# Patient Record
Sex: Female | Born: 1970
Health system: Southern US, Community
[De-identification: ages and names within clinical notes are randomized; demographics above are authoritative.]

## PROBLEM LIST (undated history)

## (undated) DIAGNOSIS — F419 Anxiety disorder, unspecified: Secondary | ICD-10-CM

## (undated) DIAGNOSIS — M810 Age-related osteoporosis without current pathological fracture: Secondary | ICD-10-CM

## (undated) DIAGNOSIS — E669 Obesity, unspecified: Secondary | ICD-10-CM

## (undated) DIAGNOSIS — N979 Female infertility, unspecified: Secondary | ICD-10-CM

## (undated) DIAGNOSIS — O09529 Supervision of elderly multigravida, unspecified trimester: Secondary | ICD-10-CM

## (undated) DIAGNOSIS — M549 Dorsalgia, unspecified: Secondary | ICD-10-CM

## (undated) DIAGNOSIS — F988 Other specified behavioral and emotional disorders with onset usually occurring in childhood and adolescence: Secondary | ICD-10-CM

## (undated) DIAGNOSIS — M2550 Pain in unspecified joint: Secondary | ICD-10-CM

## (undated) DIAGNOSIS — E119 Type 2 diabetes mellitus without complications: Secondary | ICD-10-CM

## (undated) DIAGNOSIS — I10 Essential (primary) hypertension: Secondary | ICD-10-CM

## (undated) DIAGNOSIS — G4730 Sleep apnea, unspecified: Secondary | ICD-10-CM

## (undated) DIAGNOSIS — F32A Depression, unspecified: Secondary | ICD-10-CM

## (undated) DIAGNOSIS — O1205 Gestational edema, complicating the puerperium: Secondary | ICD-10-CM

## (undated) HISTORY — PX: LEEP: SHX91

## (undated) HISTORY — PX: KNEE CARTILAGE SURGERY: SHX688

## (undated) HISTORY — DX: Pain in unspecified joint: M25.50

## (undated) HISTORY — DX: Anxiety disorder, unspecified: F41.9

## (undated) HISTORY — DX: Age-related osteoporosis without current pathological fracture: M81.0

## (undated) HISTORY — DX: Supervision of elderly multigravida, unspecified trimester: O09.529

## (undated) HISTORY — DX: Obesity, unspecified: E66.9

## (undated) HISTORY — DX: Female infertility, unspecified: N97.9

## (undated) HISTORY — DX: Dorsalgia, unspecified: M54.9

## (undated) HISTORY — DX: Other specified behavioral and emotional disorders with onset usually occurring in childhood and adolescence: F98.8

## (undated) HISTORY — PX: WISDOM TOOTH EXTRACTION: SHX21

## (undated) HISTORY — DX: Depression, unspecified: F32.A

---

## 2001-06-05 ENCOUNTER — Emergency Department (HOSPITAL_COMMUNITY): Admission: EM | Admit: 2001-06-05 | Discharge: 2001-06-05 | Payer: Self-pay | Admitting: Emergency Medicine

## 2001-06-05 ENCOUNTER — Encounter: Payer: Self-pay | Admitting: Emergency Medicine

## 2001-12-14 ENCOUNTER — Emergency Department (HOSPITAL_COMMUNITY): Admission: EM | Admit: 2001-12-14 | Discharge: 2001-12-14 | Payer: Self-pay | Admitting: Emergency Medicine

## 2002-03-31 ENCOUNTER — Emergency Department (HOSPITAL_COMMUNITY): Admission: EM | Admit: 2002-03-31 | Discharge: 2002-03-31 | Payer: Self-pay | Admitting: Emergency Medicine

## 2004-01-25 ENCOUNTER — Other Ambulatory Visit: Admission: RE | Admit: 2004-01-25 | Discharge: 2004-01-25 | Payer: Self-pay | Admitting: Obstetrics and Gynecology

## 2005-11-21 ENCOUNTER — Ambulatory Visit (HOSPITAL_COMMUNITY): Payer: Self-pay | Admitting: Psychiatry

## 2005-12-03 ENCOUNTER — Ambulatory Visit (HOSPITAL_COMMUNITY): Payer: Self-pay | Admitting: Psychiatry

## 2006-01-05 ENCOUNTER — Ambulatory Visit (HOSPITAL_COMMUNITY): Payer: Self-pay | Admitting: Psychiatry

## 2006-02-25 ENCOUNTER — Ambulatory Visit (HOSPITAL_COMMUNITY): Payer: Self-pay | Admitting: Psychiatry

## 2006-08-03 ENCOUNTER — Ambulatory Visit (HOSPITAL_COMMUNITY): Payer: Self-pay | Admitting: Psychiatry

## 2006-10-05 ENCOUNTER — Ambulatory Visit (HOSPITAL_COMMUNITY): Payer: Self-pay | Admitting: Psychiatry

## 2006-11-12 ENCOUNTER — Other Ambulatory Visit: Admission: RE | Admit: 2006-11-12 | Discharge: 2006-11-12 | Payer: Self-pay | Admitting: Family Medicine

## 2006-11-12 ENCOUNTER — Ambulatory Visit: Payer: Self-pay | Admitting: Family Medicine

## 2006-11-18 ENCOUNTER — Ambulatory Visit: Payer: Self-pay | Admitting: Family Medicine

## 2006-11-24 ENCOUNTER — Ambulatory Visit: Payer: Self-pay | Admitting: Family Medicine

## 2007-01-04 ENCOUNTER — Ambulatory Visit (HOSPITAL_COMMUNITY): Payer: Self-pay | Admitting: Psychiatry

## 2007-02-15 ENCOUNTER — Other Ambulatory Visit: Admission: RE | Admit: 2007-02-15 | Discharge: 2007-02-15 | Payer: Self-pay | Admitting: Family Medicine

## 2007-02-15 ENCOUNTER — Ambulatory Visit: Payer: Self-pay | Admitting: Family Medicine

## 2007-05-07 ENCOUNTER — Ambulatory Visit: Payer: Self-pay | Admitting: Family Medicine

## 2007-05-18 ENCOUNTER — Ambulatory Visit (HOSPITAL_COMMUNITY): Payer: Self-pay | Admitting: Psychiatry

## 2007-10-05 ENCOUNTER — Ambulatory Visit: Payer: Self-pay | Admitting: Family Medicine

## 2008-05-25 ENCOUNTER — Ambulatory Visit: Payer: Self-pay | Admitting: Family Medicine

## 2008-06-09 ENCOUNTER — Encounter: Payer: Self-pay | Admitting: Family Medicine

## 2008-06-09 ENCOUNTER — Ambulatory Visit: Payer: Self-pay | Admitting: Family Medicine

## 2008-06-09 ENCOUNTER — Other Ambulatory Visit: Admission: RE | Admit: 2008-06-09 | Discharge: 2008-06-09 | Payer: Self-pay | Admitting: Family Medicine

## 2009-10-19 ENCOUNTER — Ambulatory Visit: Payer: Self-pay | Admitting: Family Medicine

## 2009-12-07 ENCOUNTER — Ambulatory Visit: Payer: Self-pay | Admitting: Family Medicine

## 2010-01-01 ENCOUNTER — Ambulatory Visit: Payer: Self-pay | Admitting: Family Medicine

## 2010-11-11 ENCOUNTER — Other Ambulatory Visit (HOSPITAL_COMMUNITY): Payer: Self-pay | Admitting: Obstetrics & Gynecology

## 2010-11-11 DIAGNOSIS — N979 Female infertility, unspecified: Secondary | ICD-10-CM

## 2010-11-18 ENCOUNTER — Ambulatory Visit (HOSPITAL_COMMUNITY)
Admission: RE | Admit: 2010-11-18 | Discharge: 2010-11-18 | Disposition: A | Discharge status: Home / Self Care | Payer: 59 | Source: Ambulatory Visit | Attending: Obstetrics & Gynecology | Admitting: Obstetrics & Gynecology

## 2010-11-18 DIAGNOSIS — N979 Female infertility, unspecified: Secondary | ICD-10-CM | POA: Insufficient documentation

## 2011-03-29 ENCOUNTER — Emergency Department (HOSPITAL_COMMUNITY)
Admission: EM | Admit: 2011-03-29 | Discharge: 2011-03-30 | Disposition: A | Discharge status: Home / Self Care | Payer: 59 | Attending: Emergency Medicine | Admitting: Emergency Medicine

## 2011-03-29 DIAGNOSIS — F1010 Alcohol abuse, uncomplicated: Secondary | ICD-10-CM | POA: Insufficient documentation

## 2011-03-29 DIAGNOSIS — R059 Cough, unspecified: Secondary | ICD-10-CM | POA: Insufficient documentation

## 2011-03-29 DIAGNOSIS — R0602 Shortness of breath: Secondary | ICD-10-CM | POA: Insufficient documentation

## 2011-03-29 DIAGNOSIS — J45901 Unspecified asthma with (acute) exacerbation: Secondary | ICD-10-CM | POA: Insufficient documentation

## 2011-03-29 DIAGNOSIS — F988 Other specified behavioral and emotional disorders with onset usually occurring in childhood and adolescence: Secondary | ICD-10-CM | POA: Insufficient documentation

## 2011-03-29 DIAGNOSIS — R05 Cough: Secondary | ICD-10-CM | POA: Insufficient documentation

## 2011-03-29 DIAGNOSIS — I498 Other specified cardiac arrhythmias: Secondary | ICD-10-CM | POA: Insufficient documentation

## 2011-04-11 ENCOUNTER — Telehealth: Payer: Self-pay | Admitting: Family Medicine

## 2011-04-11 MED ORDER — AMPHETAMINE-DEXTROAMPHETAMINE 15 MG PO TABS: 15.0000 mg | ORAL_TABLET | Freq: Two times a day (BID) | ORAL | Status: DC

## 2011-04-11 NOTE — Telephone Encounter (Signed)
Pt needs refill for Adderall 15 mg  Bid.  Call pt when ready

## 2011-04-11 NOTE — Telephone Encounter (Signed)
DONE

## 2011-04-11 NOTE — Telephone Encounter (Signed)
Adderall renewed for 3 months 

## 2011-07-07 ENCOUNTER — Telehealth: Payer: Self-pay | Admitting: Family Medicine

## 2011-07-07 NOTE — Telephone Encounter (Signed)
It has been over a year so she needs an appointment

## 2011-07-08 ENCOUNTER — Encounter: Payer: Self-pay | Admitting: Family Medicine

## 2011-07-08 ENCOUNTER — Ambulatory Visit (INDEPENDENT_AMBULATORY_CARE_PROVIDER_SITE_OTHER): Payer: 59 | Admitting: Family Medicine

## 2011-07-08 VITALS — BP 132/90 | HR 80 | Wt 229.0 lb

## 2011-07-08 DIAGNOSIS — F909 Attention-deficit hyperactivity disorder, unspecified type: Secondary | ICD-10-CM

## 2011-07-08 MED ORDER — AMPHETAMINE-DEXTROAMPHET ER 20 MG PO CP24: 20.0000 mg | ORAL_CAPSULE | ORAL | Status: DC

## 2011-07-08 NOTE — Progress Notes (Signed)
  Subjective:    Patient ID: Mia Parker, female    DOB: [birth year], 40 y.o.   MRN: [id number]  HPI She is here for recheck on her ADD. She continues to use Adderall and states he goes for roughly 4 hours worth of benefit. Sometimes she takes this up to 3 times a day when she works long hours. She now has 2 jobs. She has no difficulties with these medications specifically with weight change or with sleeping.   Review of Systems     Objective:   Physical Exam Alert and in no distress otherwise not examined       Assessment & Plan:  ADD I will place her on Adderall XR to see if she gets longer-term relief. Also discussed the fact that we might use a shorter acting one when she needs to focus for longer than usual based on her work schedule. She will call me next week to let me know how longer acting one works

## 2011-07-08 NOTE — Patient Instructions (Signed)
Take the Adderall might me and give me a call in about a week . Let meknow how long does work and does it work

## 2011-07-11 ENCOUNTER — Telehealth: Payer: Self-pay | Admitting: Family Medicine

## 2011-07-11 NOTE — Telephone Encounter (Signed)
DONE

## 2011-07-22 ENCOUNTER — Telehealth: Payer: Self-pay | Admitting: Family Medicine

## 2011-07-22 MED ORDER — AMPHETAMINE-DEXTROAMPHET ER 25 MG PO CP24: 25.0000 mg | ORAL_CAPSULE | ORAL | Status: DC

## 2011-07-22 NOTE — Telephone Encounter (Signed)
Adderall XR 25 given. The 20 mg did not work

## 2011-07-23 NOTE — Telephone Encounter (Signed)
DONE

## 2011-07-31 ENCOUNTER — Telehealth: Payer: Self-pay | Admitting: Family Medicine

## 2011-07-31 NOTE — Telephone Encounter (Signed)
Pt called Mia Parker and advised Adderall 25 mg ER  Qd is lasting 7 - 8 hours, needs higher dose or add on.  Please advise [phone]

## 2011-08-01 MED ORDER — AMPHETAMINE-DEXTROAMPHET ER 25 MG PO CP24: 25.0000 mg | ORAL_CAPSULE | ORAL | Status: DC

## 2011-08-01 MED ORDER — AMPHETAMINE-DEXTROAMPHET ER 25 MG PO CP24: 25.0000 mg | ORAL_CAPSULE | Freq: Two times a day (BID) | ORAL | Status: DC

## 2011-08-01 NOTE — Telephone Encounter (Signed)
The Adderall XR is lasting her 8 hours but she needs close to 16 hours since she is working 2 jobs. She has been doing this for the last year and apparently doing fairly well. I warned her about possibly interfering with sleep.

## 2011-11-11 ENCOUNTER — Telehealth: Payer: Self-pay | Admitting: Family Medicine

## 2011-11-11 MED ORDER — AMPHETAMINE-DEXTROAMPHETAMINE 15 MG PO TABS: 15.0000 mg | ORAL_TABLET | Freq: Every day | ORAL | Status: DC

## 2011-11-11 NOTE — Telephone Encounter (Signed)
Adderall renewed.

## 2011-11-12 ENCOUNTER — Telehealth: Payer: Self-pay | Admitting: Family Medicine

## 2011-11-12 NOTE — Telephone Encounter (Signed)
DONE

## 2011-11-17 ENCOUNTER — Telehealth: Payer: Self-pay

## 2011-11-17 ENCOUNTER — Other Ambulatory Visit: Payer: Self-pay | Admitting: Family Medicine

## 2011-11-17 MED ORDER — AMPHETAMINE-DEXTROAMPHET ER 25 MG PO CP24: 25.0000 mg | ORAL_CAPSULE | Freq: Two times a day (BID) | ORAL | Status: DC

## 2011-11-17 MED ORDER — AMPHETAMINE-DEXTROAMPHET ER 25 MG PO CP24: 25.0000 mg | ORAL_CAPSULE | ORAL | Status: DC

## 2011-11-17 NOTE — Progress Notes (Signed)
The wrong prescriptions were written prior to this. She has been on Adderall XR 25 twice a day for several months and doing well on this. She needs this due to her 16 hour workdays.

## 2011-11-17 NOTE — Telephone Encounter (Signed)
Pt aware.

## 2011-11-17 NOTE — Telephone Encounter (Signed)
NO that the computer let us down. I will rewrite the prescriptions

## 2011-11-17 NOTE — Telephone Encounter (Signed)
Pt called and said her RX is wrong she Is on 25 mg XL BID adderal please advise

## 2012-02-13 ENCOUNTER — Telehealth: Payer: Self-pay | Admitting: Family Medicine

## 2012-02-13 MED ORDER — AMPHETAMINE-DEXTROAMPHET ER 25 MG PO CP24: 25.0000 mg | ORAL_CAPSULE | Freq: Two times a day (BID) | ORAL | Status: DC

## 2012-02-13 NOTE — Telephone Encounter (Signed)
Adderall renewed.

## 2012-02-13 NOTE — Telephone Encounter (Signed)
Pt informed

## 2012-05-13 ENCOUNTER — Telehealth: Payer: Self-pay | Admitting: Family Medicine

## 2012-05-13 MED ORDER — AMPHETAMINE-DEXTROAMPHET ER 25 MG PO CP24: 25.0000 mg | ORAL_CAPSULE | Freq: Two times a day (BID) | ORAL | Status: DC

## 2012-05-13 NOTE — Telephone Encounter (Signed)
kds °

## 2012-05-13 NOTE — Telephone Encounter (Signed)
Adderall renewed.

## 2012-06-17 ENCOUNTER — Encounter: Payer: Self-pay | Admitting: Family Medicine

## 2012-06-17 ENCOUNTER — Ambulatory Visit (INDEPENDENT_AMBULATORY_CARE_PROVIDER_SITE_OTHER): Payer: 59 | Admitting: Family Medicine

## 2012-06-17 VITALS — BP 130/80 | HR 102 | Wt 220.0 lb

## 2012-06-17 DIAGNOSIS — L309 Dermatitis, unspecified: Secondary | ICD-10-CM

## 2012-06-17 DIAGNOSIS — F909 Attention-deficit hyperactivity disorder, unspecified type: Secondary | ICD-10-CM

## 2012-06-17 DIAGNOSIS — L259 Unspecified contact dermatitis, unspecified cause: Secondary | ICD-10-CM

## 2012-06-17 DIAGNOSIS — M542 Cervicalgia: Secondary | ICD-10-CM

## 2012-06-17 MED ORDER — HYDROCODONE-ACETAMINOPHEN 5-500 MG PO TABS: 1.0000 | ORAL_TABLET | Freq: Three times a day (TID) | ORAL | Status: DC | PRN

## 2012-06-17 NOTE — Progress Notes (Signed)
  Subjective:    Patient ID: Mia Parker, female    DOB: 08-Oct-1970, 41 y.o.   MRN: [id number]  HPI 2 weeks ago she was doing some moving of heavy materials. The next day she felt some discomfort in the left scapular area. The pain got worse and she was seen in an urgent care Center. She was given tramadol and diazepam neither which have helped. She went back to the emergency room this morning for further evaluation and neck pain. She also felt some lumps at the base of her skull. They referred her to orthopedics. Also continues on her ADD medication. She continues on 2 a day which continue to help her stay focused throughout the whole day. She has no withdrawal side effects.   Review of Systems     Objective:   Physical Exam Alert and in no distress. Pain on motion of the neck. Tender to palpation in the left lower neck area. There is also erythema and slight swelling but no vesicle formation. Evaluation of her arms shows no lesions, hyperesthesia or hypoesthesia. Normal strength. DTRs normal.       Assessment & Plan:  Dermatitis possible early shingles. Neck pain. Continue with watchful waiting and pain relief. She will call if the rash spreads or her symptoms worsen in any way. ADD.she will continue her present medication regimen.

## 2012-06-21 ENCOUNTER — Telehealth: Payer: Self-pay | Admitting: Internal Medicine

## 2012-06-21 MED ORDER — VALACYCLOVIR HCL 1 G PO TABS: 1000.0000 mg | ORAL_TABLET | Freq: Two times a day (BID) | ORAL | Status: DC

## 2012-06-21 NOTE — Telephone Encounter (Signed)
She states the rash has spread and it does cause some itching.

## 2012-06-23 ENCOUNTER — Encounter: Payer: Self-pay | Admitting: Family Medicine

## 2012-06-28 ENCOUNTER — Ambulatory Visit (INDEPENDENT_AMBULATORY_CARE_PROVIDER_SITE_OTHER): Payer: 59 | Admitting: Family Medicine

## 2012-06-28 ENCOUNTER — Other Ambulatory Visit: Payer: Self-pay

## 2012-06-28 VITALS — BP 120/80 | HR 114 | Wt 222.0 lb

## 2012-06-28 DIAGNOSIS — M542 Cervicalgia: Secondary | ICD-10-CM

## 2012-06-28 DIAGNOSIS — B0229 Other postherpetic nervous system involvement: Secondary | ICD-10-CM

## 2012-06-28 MED ORDER — HYDROCODONE-ACETAMINOPHEN 5-500 MG PO TABS: 1.0000 | ORAL_TABLET | Freq: Three times a day (TID) | ORAL | Status: DC | PRN

## 2012-06-28 NOTE — Telephone Encounter (Signed)
Called vicodin in per jcl 

## 2012-06-28 NOTE — Progress Notes (Signed)
  Subjective:    Patient ID: Mia Parker, female    DOB: 1971/03/09, 41 y.o.   MRN: [id number]  HPI She is here for evaluation of continued difficulty with left neck and shoulder pain. This has been bothering her since her diagnosis of shingles.   Review of Systems     Objective:   Physical Exam Exam of the left neck does show healing blistered lesions in the upper back area in the C4 nerve root distribution       Assessment & Plan:   1. Neck pain  HYDROcodone-acetaminophen (VICODIN) 5-500 MG per tablet  2. Postherpetic neuralgia  HYDROcodone-acetaminophen (VICODIN) 5-500 MG per tablet   Discussed PHN with her. Recommended continued use of Vicodin. Discussed the fact that we might need to go to a different medication if this lasts roughly 3 months.

## 2012-06-28 NOTE — Patient Instructions (Signed)
Use the pain medication as needed. Avoid driving and the use of alcohol.

## 2012-07-13 ENCOUNTER — Telehealth: Payer: Self-pay | Admitting: Family Medicine

## 2012-07-15 NOTE — Telephone Encounter (Signed)
LM

## 2012-07-29 ENCOUNTER — Ambulatory Visit (INDEPENDENT_AMBULATORY_CARE_PROVIDER_SITE_OTHER): Payer: 59 | Admitting: Family Medicine

## 2012-07-29 ENCOUNTER — Telehealth: Payer: Self-pay | Admitting: Family Medicine

## 2012-07-29 ENCOUNTER — Encounter: Payer: Self-pay | Admitting: Family Medicine

## 2012-07-29 VITALS — BP 130/80 | HR 82 | Temp 97.8°F | Wt 221.0 lb

## 2012-07-29 DIAGNOSIS — L738 Other specified follicular disorders: Secondary | ICD-10-CM

## 2012-07-29 DIAGNOSIS — B0229 Other postherpetic nervous system involvement: Secondary | ICD-10-CM

## 2012-07-29 DIAGNOSIS — L739 Follicular disorder, unspecified: Secondary | ICD-10-CM

## 2012-07-29 MED ORDER — DOXYCYCLINE HYCLATE 100 MG PO TABS: 100.0000 mg | ORAL_TABLET | Freq: Two times a day (BID) | ORAL | Status: DC

## 2012-07-29 MED ORDER — AMITRIPTYLINE HCL 10 MG PO TABS: 10.0000 mg | ORAL_TABLET | Freq: Every day | ORAL | Status: DC

## 2012-07-29 NOTE — Progress Notes (Signed)
  Subjective:    Patient ID: Mia Parker, female    DOB: Jul 09, 1971, 41 y.o.   MRN: [id number]  HPI She is here for consult concerning itching and slight discomfort in the perianal area. She is concerned about recurrence of shingles. She also continues to have postherpetic pain that she labels as 8/10. He does take Vicodin at night to help her sleep.   Review of Systems     Objective:   Physical Exam Alert and in no distress. Exam of the perianal area does show multiple erythematous follicular lesions. No vesicles are noted.       Assessment & Plan:   1. Folliculitis  doxycycline (VIBRA-TABS) 100 MG tablet  2. Post herpetic neuralgia  amitriptyline (ELAVIL) 10 MG tablet   explained to her about the postherpetic neuralgia. I will start her out with amitriptyline and increase the dosing every several days. Also discussed other medications that might be used.

## 2012-07-29 NOTE — Telephone Encounter (Signed)
Get back in for a recheck

## 2012-07-29 NOTE — Patient Instructions (Signed)
Take the amitriptyline nightly but after 3 or 4 days if you're up note no improvement in your pain double the dose and again take that for several more days and if no improvement, call me

## 2012-07-29 NOTE — Telephone Encounter (Signed)
PT HAS APT

## 2012-08-06 ENCOUNTER — Telehealth: Payer: Self-pay | Admitting: Family Medicine

## 2012-08-09 MED ORDER — AMPHETAMINE-DEXTROAMPHET ER 25 MG PO CP24: 25.0000 mg | ORAL_CAPSULE | Freq: Two times a day (BID) | ORAL | Status: DC

## 2012-08-09 NOTE — Telephone Encounter (Signed)
Adderall renewed.

## 2012-11-02 ENCOUNTER — Telehealth: Payer: Self-pay | Admitting: Family Medicine

## 2012-11-03 MED ORDER — AMPHETAMINE-DEXTROAMPHET ER 25 MG PO CP24: 25.0000 mg | ORAL_CAPSULE | Freq: Two times a day (BID) | ORAL | Status: DC

## 2012-11-03 NOTE — Telephone Encounter (Signed)
TSD  

## 2012-11-03 NOTE — Addendum Note (Signed)
Addended by: Ronnald Nian on: 11/03/2012 10:31 PM   Modules accepted: Orders

## 2012-11-24 LAB — OB RESULTS CONSOLE HEPATITIS B SURFACE ANTIGEN: Hepatitis B Surface Ag: NEGATIVE

## 2012-11-24 LAB — OB RESULTS CONSOLE ABO/RH: RH Type: POSITIVE

## 2012-11-24 LAB — OB RESULTS CONSOLE RPR: RPR: NONREACTIVE

## 2012-12-24 ENCOUNTER — Other Ambulatory Visit: Payer: Self-pay

## 2013-01-17 ENCOUNTER — Ambulatory Visit (INDEPENDENT_AMBULATORY_CARE_PROVIDER_SITE_OTHER): Payer: 59 | Admitting: Family Medicine

## 2013-01-17 ENCOUNTER — Encounter: Payer: Self-pay | Admitting: Family Medicine

## 2013-01-17 VITALS — BP 112/78 | HR 98 | Wt 265.0 lb

## 2013-01-17 DIAGNOSIS — J309 Allergic rhinitis, unspecified: Secondary | ICD-10-CM

## 2013-01-17 DIAGNOSIS — J019 Acute sinusitis, unspecified: Secondary | ICD-10-CM

## 2013-01-17 DIAGNOSIS — J302 Other seasonal allergic rhinitis: Secondary | ICD-10-CM

## 2013-01-17 MED ORDER — FLUTICASONE PROPIONATE 50 MCG/ACT NA SUSP: 2.0000 | Freq: Every day | NASAL | Status: DC

## 2013-01-17 MED ORDER — AMOXICILLIN 875 MG PO TABS: 875.0000 mg | ORAL_TABLET | Freq: Two times a day (BID) | ORAL | Status: DC

## 2013-01-17 NOTE — Progress Notes (Signed)
  Subjective:    Patient ID: Mia Parker, female    DOB: [birth year], 42 y.o.   MRN: [id number]  HPI She has a 4 week history of nasal congestion, sinus pressure, headache, earache , rhinorrhea and itchy watery eyes,malaise, fatigue and myalgias. No PND, cough, sore throat or fever. He has a history of allergies and presently is on Zyrtec. She does not smoke and has not been exposed to anyone that has been sick. She is pregnant. She has stopped taking her Adderall   Review of Systems     Objective:   Physical Exam alert and in no distress. Tympanic membranes and canals are normal. Throat is clear. Tonsils are normal. Neck is supple without adenopathy or thyromegaly. Cardiac exam shows a regular sinus rhythm without murmurs or gallops. Lungs are clear to auscultation. Nasal mucosa is slightly pinkish with tenderness over maxillary sinuses       Assessment & Plan:  IUP (intrauterine pregnancy), incidental  Allergic rhinitis, seasonal - Plan: fluticasone (FLONASE) 50 MCG/ACT nasal spray  Acute sinusitis - Plan: amoxicillin (AMOXIL) 875 MG tablet

## 2013-01-17 NOTE — Patient Instructions (Signed)
Check with your OB concerning the Claritin and Zyrtec as well as the Flonase. Take all the antibiotic and if not totally back to normal give me a call

## 2013-03-02 ENCOUNTER — Other Ambulatory Visit (HOSPITAL_COMMUNITY): Payer: Self-pay | Admitting: Obstetrics and Gynecology

## 2013-03-02 DIAGNOSIS — Z6841 Body Mass Index (BMI) 40.0 and over, adult: Secondary | ICD-10-CM

## 2013-03-02 DIAGNOSIS — O09529 Supervision of elderly multigravida, unspecified trimester: Secondary | ICD-10-CM

## 2013-03-02 DIAGNOSIS — Z0489 Encounter for examination and observation for other specified reasons: Secondary | ICD-10-CM

## 2013-03-09 ENCOUNTER — Ambulatory Visit (HOSPITAL_COMMUNITY)
Admission: RE | Admit: 2013-03-09 | Discharge: 2013-03-09 | Disposition: A | Discharge status: Home / Self Care | Payer: 59 | Source: Ambulatory Visit | Attending: Obstetrics and Gynecology | Admitting: Obstetrics and Gynecology

## 2013-03-09 ENCOUNTER — Encounter (HOSPITAL_COMMUNITY): Payer: Self-pay

## 2013-03-09 DIAGNOSIS — O358XX0 Maternal care for other (suspected) fetal abnormality and damage, not applicable or unspecified: Secondary | ICD-10-CM | POA: Insufficient documentation

## 2013-03-09 DIAGNOSIS — O09529 Supervision of elderly multigravida, unspecified trimester: Secondary | ICD-10-CM | POA: Insufficient documentation

## 2013-03-09 DIAGNOSIS — IMO0001 Reserved for inherently not codable concepts without codable children: Secondary | ICD-10-CM | POA: Insufficient documentation

## 2013-03-09 DIAGNOSIS — O99210 Obesity complicating pregnancy, unspecified trimester: Secondary | ICD-10-CM | POA: Insufficient documentation

## 2013-03-09 DIAGNOSIS — Z6841 Body Mass Index (BMI) 40.0 and over, adult: Secondary | ICD-10-CM

## 2013-03-09 DIAGNOSIS — Z363 Encounter for antenatal screening for malformations: Secondary | ICD-10-CM | POA: Insufficient documentation

## 2013-03-09 DIAGNOSIS — Z1389 Encounter for screening for other disorder: Secondary | ICD-10-CM | POA: Insufficient documentation

## 2013-03-09 DIAGNOSIS — E669 Obesity, unspecified: Secondary | ICD-10-CM | POA: Insufficient documentation

## 2013-03-09 DIAGNOSIS — Z0489 Encounter for examination and observation for other specified reasons: Secondary | ICD-10-CM

## 2013-03-09 NOTE — Progress Notes (Signed)
Genetic Counseling  High-Risk Gestation Note  Appointment Date:  03/09/2013 Referred By: Mia Aden, MD Date of Birth:  Jan 25, 1971 Partner:  Mia Parker   Pregnancy History: [id number] Estimated Date of Delivery: 06/25/13 Estimated Gestational Age: [redacted]w[redacted]d Attending: Particia Nearing, MD   Mia Parker and her partner, [name], were seen for genetic counseling because of a maternal age of 42.  They were counseled regarding maternal age and the association with risk for chromosome conditions due to nondisjunction with aging of the ova.   We reviewed chromosomes, nondisjunction, and the associated 1 in 65 risk for fetal aneuploidy related to a maternal age of 42 y.o. at [redacted]w[redacted]d gestation. They were counseled that the risk for aneuploidy decreases as gestational age increases, accounting for those pregnancies which spontaneously abort.  We specifically discussed Down syndrome (trisomy 10), trisomies 74 and 28, and sex chromosome aneuploidies (47,XXX and 47,XXY) including the common features and prognoses of each.   We reviewed the results of Mia Parker's first trimester screen and her cell free fetal DNA (cffDNA) testing ([facility] prenatal test).  We discussed that both of these screening results were normal, and associated with a significant reduction in risks for specific fetal aneuploidy.  We reviewed the benefits and limitations of these tests.  Specifically, we discussed the conditions for which each test screens, the detection rates, and false positive rates of each.  They were also counseled regarding the availability of diagnostic testing via amniocentesis. We reviewed the approximate 1 in 300-500 risk for complications for amniocentesis, including spontaneous pregnancy loss. After consideration of all the options, they declined further testing for fetal aneuploidy.  We also discussed that Mia Parker's detailed ultrasound revealed a single umbilical artery (SUA).  They were  counseled that SUA occurs in approximately 1 in every 200 pregnancies and is defined as the presence of one umbilical artery and one umbilical vein, instead of the typical three vessel cord containing two arteries and one vein.  We discussed that the finding of an isolated SUA (no other markers or anomalies visualized) is not thought to increase the risk for fetal aneuploidy. However, the literature is conflicting regarding an association between this finding and congenital heart defects.  In addition, we discussed that SUA is known to be associated with an increased risk for fetal growth restriction.  We reviewed the options of serial ultrasound to monitor fetal growth and a fetal echocardiogram.      In addition, we discussed Mia Parker's age of 42.  They were counseled that advanced paternal age (APA) is defined as paternal age greater than or equal to age 87.  Recent large-scale sequencing studies have shown that approximately 80% of de novo point mutations are of paternal origin.  Many studies have demonstrated a strong correlation between increased paternal age and de novo point mutations.  Although no specific data is available regarding fetal risks for fathers 6+ years old at conception, it is apparent that the overall risk for single gene conditions is increased.  To estimate the relative increase in risk of a genetic disorder with APA, the heritability of the disease must be considered.  Assuming an approximate 2x increase in risk for conditions that are exclusively paternal in origin, the risk for each individual condition is still relatively low.  It is estimated that the overall chance for a de novo mutation is ~0.5%.  We also discussed the wide range of conditions which can be caused by new dominant gene mutations (achondroplasia, neurofibromatosis,  Marfan syndrome etc.).  They were counseled that genetic testing for each individual single gene condition is not warranted or available unless  ultrasound or family history concerns lend suspicion to a specific condition.    In addition, they were counseled that some literature suggests that APA is also associated with an increase in risk for fetal aneuploidy.  While other literature does not support this, we discussed that a specific risk for aneuploidy other than that based on maternal age cannot be quantified.  Lastly, we discussed that newer literature suggests that the risk for autism spectrum disorders (ASD) may be increased in children born to fathers of APA.  We discussed that ASDs are among the most common neurodevelopmental disorders, with approximately 1 in 85 children meeting criteria for ASD.  Approximately 80% of individuals diagnosed are female.  There is strong evidence that genetic factors play a critical role in development of ASD.  While there have been recent advances in identifying specific genetic causes of ASD, there are still many individuals for whom the etiology of the ASD is not known.  They understand that at this time there is no reliable, comprehensive genetic testing available for ASDs.     Both family histories were reviewed and found to be noncontributory for birth defects, mental retardation, and known genetic conditions. Mia Parker reported that when he was a child, an emergency room physician in [place] suggested that he has skeletal features of Marfan syndrome.  He was never referred to a pediatric medical geneticist; however, he reported that his pediatrician followed him and sent him for several echocardiograms during childhood/adolescence.  He reported that his echocardiograms have been within normal limits (medical records not available to verify the reported history).  We discussed that Marfan syndrome is a specific connective tissue disorder that is inherited in an autosomal dominant manner.  We reviewed autosomal dominant inheritance and the associated 50% risk of recurrence.  We discussed that molecular  analysis of FBN1 is available, but testing is not warranted unless the clinical suspicion of Marfan syndrome is high.  We discussed the option of a referral to medical genetics for an evaluation.  Mia Parker will contact us if he is interested.  He understands that the clinical, specifically the skeletal, features of Marfan syndrome overlap with other connective tissue disorders or can be unrelated to a specific syndrome (familial traits).  The risk to the fetus depends on whether or not Mia Parker has a specific diagnosis.  Prenatal genetic analysis is not warranted, given that Mia Parker does not have a specific diagnosis or known genetic alteration.  Without further information regarding the provided family history, an accurate genetic risk cannot be calculated. Further genetic counseling is warranted if more information is obtained.  Mia Parker denied exposure to environmental toxins or chemical agents. She denied the use of alcohol, tobacco or street drugs. She denied significant viral illnesses during the course of her pregnancy.   I counseled this couple regarding the above risks and available options.  The approximate face-to-face time with the genetic counselor was 45 minutes.  Donald Prose, MS Certified Genetic Counselor

## 2013-04-06 ENCOUNTER — Encounter: Payer: 59 | Attending: Obstetrics and Gynecology | Admitting: *Deleted

## 2013-04-06 ENCOUNTER — Encounter: Payer: Self-pay | Admitting: *Deleted

## 2013-04-06 VITALS — Ht 69.0 in | Wt 286.4 lb

## 2013-04-06 DIAGNOSIS — Z713 Dietary counseling and surveillance: Secondary | ICD-10-CM | POA: Insufficient documentation

## 2013-04-06 DIAGNOSIS — O99810 Abnormal glucose complicating pregnancy: Secondary | ICD-10-CM | POA: Insufficient documentation

## 2013-04-06 NOTE — Patient Instructions (Signed)
Goals:  Check glucose levels per MD as instructed  Follow Gestational Diabetes Diet as instructed  Call for follow-up as needed    

## 2013-04-06 NOTE — Progress Notes (Signed)
  Patient was seen on 04-06-13 for Gestational Diabetes self-management class at the [facility]. The following learning objectives were met by the patient during this course:   States the definition of Gestational Diabetes  States why dietary management is important in controlling blood glucose  Describes the effects each nutrient has on blood glucose levels  Demonstrates ability to create a balanced meal plan  Demonstrates carbohydrate counting   States when to check blood glucose levels  Demonstrates proper blood glucose monitoring techniques  States the effect of stress and exercise on blood glucose levels  States the importance of limiting caffeine and abstaining from alcohol and smoking  Blood glucose monitor given: [employer] Nano Lot # [id number] Exp: 06-14-14 Blood glucose reading: 122  Patient instructed to monitor glucose levels: FBS: 60 - <90 1 hour: <140 2 hour: <120  *Patient received handouts:  Nutrition Diabetes and Pregnancy  Carbohydrate Counting List  Patient will be seen for follow-up as needed.

## 2013-05-25 ENCOUNTER — Inpatient Hospital Stay (HOSPITAL_COMMUNITY): Admission: AD | Admit: 2013-05-25 | Payer: Self-pay | Source: Ambulatory Visit | Admitting: Obstetrics and Gynecology

## 2013-05-27 ENCOUNTER — Other Ambulatory Visit: Payer: Self-pay | Admitting: Obstetrics and Gynecology

## 2013-05-30 ENCOUNTER — Telehealth (HOSPITAL_COMMUNITY): Payer: Self-pay | Admitting: *Deleted

## 2013-05-30 ENCOUNTER — Encounter (HOSPITAL_COMMUNITY): Payer: Self-pay | Admitting: *Deleted

## 2013-05-30 NOTE — Telephone Encounter (Signed)
Preadmission screen  

## 2013-06-02 ENCOUNTER — Other Ambulatory Visit: Payer: Self-pay | Admitting: Obstetrics and Gynecology

## 2013-06-06 ENCOUNTER — Encounter (HOSPITAL_COMMUNITY): Payer: Self-pay

## 2013-06-06 ENCOUNTER — Inpatient Hospital Stay (HOSPITAL_COMMUNITY)
Admission: RE | Admit: 2013-06-06 | Discharge: 2013-06-10 | DRG: 765 | Disposition: A | Discharge status: Home / Self Care | Payer: 59 | Source: Ambulatory Visit | Attending: Obstetrics & Gynecology | Admitting: Obstetrics & Gynecology

## 2013-06-06 ENCOUNTER — Telehealth (HOSPITAL_COMMUNITY): Payer: Self-pay | Admitting: *Deleted

## 2013-06-06 DIAGNOSIS — O4100X0 Oligohydramnios, unspecified trimester, not applicable or unspecified: Secondary | ICD-10-CM | POA: Diagnosis present

## 2013-06-06 DIAGNOSIS — O99892 Other specified diseases and conditions complicating childbirth: Secondary | ICD-10-CM | POA: Diagnosis present

## 2013-06-06 DIAGNOSIS — Q270 Congenital absence and hypoplasia of umbilical artery: Secondary | ICD-10-CM

## 2013-06-06 DIAGNOSIS — Z22330 Carrier of Group B streptococcus: Secondary | ICD-10-CM

## 2013-06-06 DIAGNOSIS — O09529 Supervision of elderly multigravida, unspecified trimester: Secondary | ICD-10-CM | POA: Diagnosis present

## 2013-06-06 DIAGNOSIS — O99814 Abnormal glucose complicating childbirth: Principal | ICD-10-CM | POA: Diagnosis present

## 2013-06-06 DIAGNOSIS — O1205 Gestational edema, complicating the puerperium: Secondary | ICD-10-CM | POA: Diagnosis not present

## 2013-06-06 HISTORY — DX: Gestational edema, complicating the puerperium: O12.05

## 2013-06-06 LAB — CBC
HCT: 39.6 % (ref 36.0–46.0)
Platelets: 305 10*3/uL (ref 150–400)
RDW: 14 % (ref 11.5–15.5)
WBC: 17.3 10*3/uL — ABNORMAL HIGH (ref 4.0–10.5)

## 2013-06-06 LAB — GLUCOSE, CAPILLARY: Glucose-Capillary: 133 mg/dL — ABNORMAL HIGH (ref 70–99)

## 2013-06-06 LAB — ABO/RH: ABO/RH(D): B POS

## 2013-06-06 LAB — TYPE AND SCREEN: ABO/RH(D): B POS

## 2013-06-06 MED ORDER — PENICILLIN G POTASSIUM 5000000 UNITS IJ SOLR
5.0000 10*6.[IU] | Freq: Once | INTRAVENOUS | Status: AC
  Administered 2013-06-07: 5 10*6.[IU] via INTRAVENOUS
  Filled 2013-06-06: qty 5

## 2013-06-06 MED ORDER — OXYTOCIN 40 UNITS IN LACTATED RINGERS INFUSION - SIMPLE MED
1.0000 m[IU]/min | INTRAVENOUS | Status: DC
  Administered 2013-06-07: 16 m[IU]/min via INTRAVENOUS
  Administered 2013-06-07: 22 m[IU]/min via INTRAVENOUS
  Administered 2013-06-07: 2 m[IU]/min via INTRAVENOUS
  Filled 2013-06-06: qty 1000

## 2013-06-06 MED ORDER — PENICILLIN G POTASSIUM 5000000 UNITS IJ SOLR
2.5000 10*6.[IU] | INTRAVENOUS | Status: DC
  Filled 2013-06-06 (×2): qty 2.5

## 2013-06-06 MED ORDER — OXYCODONE-ACETAMINOPHEN 5-325 MG PO TABS
1.0000 | ORAL_TABLET | ORAL | Status: DC | PRN
Start: 2013-06-06 — End: 2013-06-08

## 2013-06-06 MED ORDER — OXYTOCIN 40 UNITS IN LACTATED RINGERS INFUSION - SIMPLE MED: 62.5000 mL/h | INTRAVENOUS | Status: DC

## 2013-06-06 MED ORDER — TERBUTALINE SULFATE 1 MG/ML IJ SOLN: 0.2500 mg | Freq: Once | INTRAMUSCULAR | Status: AC | PRN

## 2013-06-06 MED ORDER — OXYTOCIN BOLUS FROM INFUSION: 500.0000 mL | INTRAVENOUS | Status: DC

## 2013-06-06 MED ORDER — MISOPROSTOL 25 MCG QUARTER TABLET
25.0000 ug | ORAL_TABLET | ORAL | Status: DC | PRN
  Administered 2013-06-06 – 2013-06-07 (×2): 25 ug via VAGINAL
  Filled 2013-06-06 (×2): qty 0.25

## 2013-06-06 MED ORDER — LACTATED RINGERS IV SOLN: 500.0000 mL | INTRAVENOUS | Status: DC | PRN

## 2013-06-06 MED ORDER — PENICILLIN G POTASSIUM 5000000 UNITS IJ SOLR
2.5000 10*6.[IU] | INTRAVENOUS | Status: DC
  Administered 2013-06-07 (×3): 2.5 10*6.[IU] via INTRAVENOUS
  Filled 2013-06-06 (×8): qty 2.5

## 2013-06-06 MED ORDER — PENICILLIN G POTASSIUM 5000000 UNITS IJ SOLR
5.0000 10*6.[IU] | Freq: Once | INTRAVENOUS | Status: DC
  Filled 2013-06-06: qty 5

## 2013-06-06 MED ORDER — LACTATED RINGERS IV SOLN
INTRAVENOUS | Status: DC
  Administered 2013-06-06 – 2013-06-07 (×6): via INTRAVENOUS

## 2013-06-06 MED ORDER — CITRIC ACID-SODIUM CITRATE 334-500 MG/5ML PO SOLN
30.0000 mL | ORAL | Status: DC | PRN
  Administered 2013-06-07: 30 mL via ORAL
  Filled 2013-06-06: qty 15

## 2013-06-06 MED ORDER — ACETAMINOPHEN 325 MG PO TABS: 650.0000 mg | ORAL_TABLET | ORAL | Status: DC | PRN

## 2013-06-06 MED ORDER — ONDANSETRON HCL 4 MG/2ML IJ SOLN: 4.0000 mg | Freq: Four times a day (QID) | INTRAMUSCULAR | Status: DC | PRN

## 2013-06-06 MED ORDER — LIDOCAINE HCL (PF) 1 % IJ SOLN
30.0000 mL | INTRAMUSCULAR | Status: DC | PRN
  Filled 2013-06-06: qty 30

## 2013-06-06 MED ORDER — IBUPROFEN 600 MG PO TABS: 600.0000 mg | ORAL_TABLET | Freq: Four times a day (QID) | ORAL | Status: DC | PRN

## 2013-06-06 MED ORDER — ZOLPIDEM TARTRATE 5 MG PO TABS: 5.0000 mg | ORAL_TABLET | Freq: Every evening | ORAL | Status: DC | PRN

## 2013-06-06 NOTE — Progress Notes (Signed)
Mia Parker is a 42 y.o. G2P0010 at [redacted]w[redacted]d by LMP admitted for induction of labor due to SUA and Class A2DM.  Subjective: Comfortable.  Objective: BP 139/87  Pulse 98  Temp(Src) 97.4 F (36.3 C) (Oral)  Resp 20  Ht 5\' 9"  (1.753 m)  Wt 130.636 kg (288 lb)  BMI 42.51 kg/m2  LMP 09/28/2012      FHT:  FHR: 155 bpm, variability: moderate,  accelerations:  Present,  decelerations:  Absent UC:   none SVE:   Dilation: 1 Effacement (%): 50 Station: -2 Exam by:: [employer] RN Cytotec placed  Labs: Lab Results  Component Value Date   WBC 17.3* 06/06/2013   HGB 13.9 06/06/2013   HCT 39.6 06/06/2013   MCV 83.4 06/06/2013   PLT 305 06/06/2013    Assessment / Plan: 37 weeks. SUA Class A2DM  Labor: Progressing normally Preeclampsia:  labs stable Fetal Wellbeing:  Category I Pain Control:  Labor support without medications I/D:  n/a Anticipated MOD:  NSVD  Falecia Vannatter J 06/06/2013, 9:10 PM

## 2013-06-06 NOTE — Telephone Encounter (Signed)
Preadmission screen  

## 2013-06-07 ENCOUNTER — Encounter (HOSPITAL_COMMUNITY): Payer: Self-pay

## 2013-06-07 ENCOUNTER — Inpatient Hospital Stay (HOSPITAL_COMMUNITY): Payer: 59 | Admitting: Anesthesiology

## 2013-06-07 ENCOUNTER — Encounter (HOSPITAL_COMMUNITY)
Admission: RE | Disposition: A | Discharge status: Home / Self Care | Payer: Self-pay | Source: Ambulatory Visit | Attending: Obstetrics & Gynecology

## 2013-06-07 ENCOUNTER — Encounter (HOSPITAL_COMMUNITY): Payer: Self-pay | Admitting: Anesthesiology

## 2013-06-07 LAB — GLUCOSE, CAPILLARY
Glucose-Capillary: 80 mg/dL (ref 70–99)
Glucose-Capillary: 123 mg/dL — ABNORMAL HIGH (ref 70–99)
Glucose-Capillary: 83 mg/dL (ref 70–99)

## 2013-06-07 LAB — RPR: RPR Ser Ql: NONREACTIVE

## 2013-06-07 SURGERY — Surgical Case
Anesthesia: Epidural | Site: Abdomen | Wound class: Clean Contaminated

## 2013-06-07 MED ORDER — EPHEDRINE 5 MG/ML INJ
10.0000 mg | INTRAVENOUS | Status: DC | PRN
  Filled 2013-06-07: qty 4

## 2013-06-07 MED ORDER — MEPERIDINE HCL 25 MG/ML IJ SOLN
INTRAMUSCULAR | Status: AC
  Filled 2013-06-07: qty 1

## 2013-06-07 MED ORDER — LACTATED RINGERS IV SOLN
500.0000 mL | Freq: Once | INTRAVENOUS | Status: AC
  Administered 2013-06-07: 500 mL via INTRAVENOUS

## 2013-06-07 MED ORDER — PHENYLEPHRINE 40 MCG/ML (10ML) SYRINGE FOR IV PUSH (FOR BLOOD PRESSURE SUPPORT)
80.0000 ug | PREFILLED_SYRINGE | INTRAVENOUS | Status: DC | PRN
  Filled 2013-06-07: qty 5

## 2013-06-07 MED ORDER — KETOROLAC TROMETHAMINE 30 MG/ML IJ SOLN: 30.0000 mg | Freq: Four times a day (QID) | INTRAMUSCULAR | Status: DC | PRN

## 2013-06-07 MED ORDER — ONDANSETRON HCL 4 MG/2ML IJ SOLN
INTRAMUSCULAR | Status: DC | PRN
  Administered 2013-06-07: 4 mg via INTRAVENOUS

## 2013-06-07 MED ORDER — CEFAZOLIN SODIUM-DEXTROSE 2-3 GM-% IV SOLR
INTRAVENOUS | Status: AC
  Filled 2013-06-07: qty 50

## 2013-06-07 MED ORDER — ONDANSETRON HCL 4 MG/2ML IJ SOLN
INTRAMUSCULAR | Status: AC
  Filled 2013-06-07: qty 2

## 2013-06-07 MED ORDER — DIPHENHYDRAMINE HCL 50 MG/ML IJ SOLN: 12.5000 mg | INTRAMUSCULAR | Status: DC | PRN

## 2013-06-07 MED ORDER — SODIUM BICARBONATE 8.4 % IV SOLN
INTRAVENOUS | Status: DC | PRN
  Administered 2013-06-07: 10 mL via EPIDURAL
  Administered 2013-06-07: 5 mL via EPIDURAL

## 2013-06-07 MED ORDER — SCOPOLAMINE 1 MG/3DAYS TD PT72
1.0000 | MEDICATED_PATCH | Freq: Once | TRANSDERMAL | Status: DC
  Administered 2013-06-08: 1.5 mg via TRANSDERMAL

## 2013-06-07 MED ORDER — LIDOCAINE HCL (PF) 1 % IJ SOLN
INTRAMUSCULAR | Status: DC | PRN
  Administered 2013-06-07 (×2): 9 mL

## 2013-06-07 MED ORDER — KETOROLAC TROMETHAMINE 60 MG/2ML IM SOLN: 60.0000 mg | Freq: Once | INTRAMUSCULAR | Status: AC | PRN

## 2013-06-07 MED ORDER — HYDROMORPHONE HCL PF 1 MG/ML IJ SOLN
0.2500 mg | INTRAMUSCULAR | Status: DC | PRN
  Administered 2013-06-08: 0.5 mg via INTRAVENOUS

## 2013-06-07 MED ORDER — OXYTOCIN 10 UNIT/ML IJ SOLN
40.0000 [IU] | INTRAVENOUS | Status: DC | PRN
  Administered 2013-06-07: 40 [IU] via INTRAVENOUS

## 2013-06-07 MED ORDER — FENTANYL 2.5 MCG/ML BUPIVACAINE 1/10 % EPIDURAL INFUSION (WH - ANES)
INTRAMUSCULAR | Status: DC | PRN
  Administered 2013-06-07: 14 mL/h via EPIDURAL

## 2013-06-07 MED ORDER — SODIUM BICARBONATE 8.4 % IV SOLN
INTRAVENOUS | Status: AC
  Filled 2013-06-07: qty 50

## 2013-06-07 MED ORDER — MEPERIDINE HCL 25 MG/ML IJ SOLN: 6.2500 mg | INTRAMUSCULAR | Status: DC | PRN

## 2013-06-07 MED ORDER — EPHEDRINE 5 MG/ML INJ: 10.0000 mg | INTRAVENOUS | Status: DC | PRN

## 2013-06-07 MED ORDER — PHENYLEPHRINE HCL 10 MG/ML IJ SOLN
INTRAMUSCULAR | Status: DC | PRN
  Administered 2013-06-07 (×2): 40 ug via INTRAVENOUS

## 2013-06-07 MED ORDER — MEPERIDINE HCL 25 MG/ML IJ SOLN
INTRAMUSCULAR | Status: DC | PRN
  Administered 2013-06-07 (×2): 12.5 mg via INTRAVENOUS

## 2013-06-07 MED ORDER — BUPIVACAINE HCL (PF) 0.25 % IJ SOLN
INTRAMUSCULAR | Status: DC | PRN
  Administered 2013-06-07: 10 mL

## 2013-06-07 MED ORDER — PHENYLEPHRINE 40 MCG/ML (10ML) SYRINGE FOR IV PUSH (FOR BLOOD PRESSURE SUPPORT)
80.0000 ug | PREFILLED_SYRINGE | INTRAVENOUS | Status: DC | PRN
  Administered 2013-06-07: 80 ug via INTRAVENOUS

## 2013-06-07 MED ORDER — OXYTOCIN 10 UNIT/ML IJ SOLN
INTRAMUSCULAR | Status: AC
  Filled 2013-06-07: qty 4

## 2013-06-07 MED ORDER — MORPHINE SULFATE (PF) 0.5 MG/ML IJ SOLN
INTRAMUSCULAR | Status: DC | PRN
  Administered 2013-06-07: 4 mg via EPIDURAL

## 2013-06-07 MED ORDER — MORPHINE SULFATE 0.5 MG/ML IJ SOLN
INTRAMUSCULAR | Status: AC
  Filled 2013-06-07: qty 10

## 2013-06-07 MED ORDER — PHENYLEPHRINE 40 MCG/ML (10ML) SYRINGE FOR IV PUSH (FOR BLOOD PRESSURE SUPPORT)
PREFILLED_SYRINGE | INTRAVENOUS | Status: AC
  Filled 2013-06-07: qty 5

## 2013-06-07 MED ORDER — FENTANYL 2.5 MCG/ML BUPIVACAINE 1/10 % EPIDURAL INFUSION (WH - ANES)
14.0000 mL/h | INTRAMUSCULAR | Status: DC | PRN
  Administered 2013-06-07: 14 mL/h via EPIDURAL
  Filled 2013-06-07 (×2): qty 125

## 2013-06-07 MED ORDER — LIDOCAINE-EPINEPHRINE (PF) 2 %-1:200000 IJ SOLN
INTRAMUSCULAR | Status: AC
  Filled 2013-06-07: qty 20

## 2013-06-07 MED ORDER — BUPIVACAINE HCL (PF) 0.25 % IJ SOLN
INTRAMUSCULAR | Status: AC
  Filled 2013-06-07: qty 30

## 2013-06-07 SURGICAL SUPPLY — 34 items
BLADE SURG CLIPPER 3M 9600 (MISCELLANEOUS) ×1 IMPLANT
CLAMP CORD UMBIL (MISCELLANEOUS) IMPLANT
CLEANER TIP ELECTROSURG 2X2 (MISCELLANEOUS) ×2 IMPLANT
CLOTH BEACON ORANGE TIMEOUT ST (SAFETY) ×2 IMPLANT
CONTAINER PREFILL 10% NBF 15ML (MISCELLANEOUS) IMPLANT
DEVICE BLD TRNS LUER ATTCH (MISCELLANEOUS) ×2 IMPLANT
DRAPE LG THREE QUARTER DISP (DRAPES) ×4 IMPLANT
DRSG OPSITE POSTOP 4X10 (GAUZE/BANDAGES/DRESSINGS) ×2 IMPLANT
DURAPREP 26ML APPLICATOR (WOUND CARE) ×2 IMPLANT
ELECT REM PT RETURN 9FT ADLT (ELECTROSURGICAL) ×2
ELECTRODE REM PT RTRN 9FT ADLT (ELECTROSURGICAL) ×1 IMPLANT
EXTRACTOR VACUUM M CUP 4 TUBE (SUCTIONS) IMPLANT
GLOVE BIO SURGEON STRL SZ7.5 (GLOVE) ×2 IMPLANT
GOWN PREVENTION PLUS XLARGE (GOWN DISPOSABLE) ×2 IMPLANT
GOWN STRL REIN XL XLG (GOWN DISPOSABLE) ×2 IMPLANT
KIT ABG SYR 3ML LUER SLIP (SYRINGE) IMPLANT
NDL HYPO 25X1 1.5 SAFETY (NEEDLE) ×1 IMPLANT
NDL HYPO 25X5/8 SAFETYGLIDE (NEEDLE) IMPLANT
NEEDLE HYPO 25X1 1.5 SAFETY (NEEDLE) ×2 IMPLANT
NEEDLE HYPO 25X5/8 SAFETYGLIDE (NEEDLE) IMPLANT
NS IRRIG 1000ML POUR BTL (IV SOLUTION) ×2 IMPLANT
PACK C SECTION WH (CUSTOM PROCEDURE TRAY) ×2 IMPLANT
PENCIL BUTTON HOLSTER BLD 10FT (ELECTRODE) ×2 IMPLANT
STAPLER VISISTAT 35W (STAPLE) ×2 IMPLANT
SUT MNCRL 0 VIOLET CTX 36 (SUTURE) ×2 IMPLANT
SUT MON AB 2-0 CT1 27 (SUTURE) ×2 IMPLANT
SUT MON AB-0 CT1 36 (SUTURE) ×3 IMPLANT
SUT MONOCRYL 0 CTX 36 (SUTURE) ×2
SUT PLAIN 0 NONE (SUTURE) IMPLANT
SUT PLAIN 2 0 XLH (SUTURE) ×1 IMPLANT
SYR CONTROL 10ML LL (SYRINGE) ×2 IMPLANT
TOWEL OR 17X24 6PK STRL BLUE (TOWEL DISPOSABLE) ×2 IMPLANT
TRAY FOLEY CATH 14FR (SET/KITS/TRAYS/PACK) ×1 IMPLANT
WATER STERILE IRR 1000ML POUR (IV SOLUTION) ×1 IMPLANT

## 2013-06-07 NOTE — Progress Notes (Signed)
Mia Parker is a 42 y.o. G2P0010 at [redacted]w[redacted]d by LMP admitted for induction of labor due to Gestational diabetes and SUA.  Subjective: Comfortable  Objective: BP 114/63  Pulse 104  Temp(Src) 98.4 F (36.9 C) (Oral)  Resp 18  Ht 5\' 9"  (1.753 m)  Wt 130.636 kg (288 lb)  BMI 42.51 kg/m2  SpO2 97%  LMP 09/28/2012   Total I/O In: -  Out: 2000 [Urine:2000]  FHT:  FHR: 155 bpm, variability: moderate,  accelerations:  Present,  decelerations:  Absent UC:   regular, every 3 minutes SVE:   Dilation: 6.5 Effacement (%): 80 Station: 0 Exam by:: Dr Billy Coast  Labs: Lab Results  Component Value Date   WBC 17.3* 06/06/2013   HGB 13.9 06/06/2013   HCT 39.6 06/06/2013   MCV 83.4 06/06/2013   PLT 305 06/06/2013    Assessment / Plan: SUA GDM  Labor: Progressing normally Preeclampsia:  labs stable Fetal Wellbeing:  Category I Pain Control:  Epidural I/D:  n/a Anticipated MOD:  NSVD  Mia Parker 06/07/2013, 8:32 PM

## 2013-06-07 NOTE — H&P (Signed)
Mia Parker, Mia Parker              ACCOUNT NO.:  [id number]  MEDICAL RECORD NO.:  [id number]  LOCATION:  9160                          FACILITY:  [facility]  PHYSICIAN:  Lenoard Aden, M.D.DATE OF BIRTH:  Sep 02, 1971  DATE OF ADMISSION:  06/06/2013 DATE OF DISCHARGE:                             HISTORY & PHYSICAL   CHIEF COMPLIANT:  A 40 weeks with a single umbilical artery and class A diabetes mellitus at 37 weeks for induction.  HISTORY OF PRESENT ILLNESS:  She is a 42 year old, G2, P0 with known single umbilical artery and gestational diabetes, now at term for induction of labor.  FAMILY HISTORY:  Myocardial infarction, chronic hypertension, and diabetes.  PERSONAL HISTORY:  Carbohydrate intolerance.  MEDICATIONS:  Include prenatal vitamins.  SOCIAL HISTORY:  She is a nonsmoker, nondrinker.  She denies domestic or physical violence.  She has a history of miscarriage x1.  PRENATAL COURSE:  Complicated by single umbilical artery with borderline oligohydramnios, and gestational diabetes.  GBS positivity and advanced maternal age.  PHYSICAL EXAMINATION:  GENERAL:  She is a well-developed, well- nourished, white female, in no acute distress. HEENT:  Normal. NECK:  Supple.  Full range of motion. LUNGS:  Clear. HEART:  Regular rate and rhythm. ABDOMEN:  Soft, gravid, nontender.  Estimated fetal weight 6.5-7 pounds. Cervix is 1-2 cm, 70%, vertex, -2. EXTREMITIES:  There are no cords. NEUROLOGIC:  Nonfocal. SKIN:  Intact.  NST reactive.  Cytotec placed.  IMPRESSION: 1. A 40 and 2/7th week gestational age, term intrauterine pregnancy. 2. Single umbilical artery with borderline oligohydramnios. 3. Gestational diabetes. 4. GBS positive.  PLAN:  To admit serial glucose monitoring, Cytotec tonight, Pitocin in a.m.  Anticipate cautious attempts at vaginal delivery, antibiotic prophylaxis, and labor.     Lenoard Aden, M.D.     RJT/MEDQ  D:  06/06/2013  T:  06/07/2013   Job:  [id number]

## 2013-06-07 NOTE — Progress Notes (Signed)
Mia Parker is a 42 y.o. G2P0010 at [redacted]w[redacted]d by LMP admitted for induction of labor due to SUA and GDM.  Subjective: comfortable  Objective: BP 129/68  Pulse 72  Temp(Src) 98 F (36.7 C) (Oral)  Resp 18  Ht 5\' 9"  (1.753 m)  Wt 130.636 kg (288 lb)  BMI 42.51 kg/m2  LMP 09/28/2012      FHT:  FHR: 155 bpm, variability: moderate,  accelerations:  Present,  decelerations:  Absent UC:   irregular, every 8 minutes SVE:   Dilation: 2 Effacement (%): 80 Station: -2 Exam by:: [employer] RN AROM- clear  Labs: Lab Results  Component Value Date   WBC 17.3* 06/06/2013   HGB 13.9 06/06/2013   HCT 39.6 06/06/2013   MCV 83.4 06/06/2013   PLT 305 06/06/2013    Assessment / Plan: Induction of labor due to gestational diabetes and SUA,  progressing well on pitocin  Labor: Progressing normally Preeclampsia:  labs stable Fetal Wellbeing:  Category I Pain Control:  Labor support without medications I/D:  n/a Anticipated MOD:  NSVD  Lipa Knauff J 06/07/2013, 8:04 AM

## 2013-06-07 NOTE — Anesthesia Preprocedure Evaluation (Signed)
Anesthesia Evaluation  Patient identified by MRN, date of birth, ID band Patient awake    Reviewed: Allergy & Precautions, H&P , NPO status , Patient's Chart, lab work & pertinent test results  Airway Mallampati: II  TM Distance: >3 FB Neck ROM: full    Dental no notable dental hx.    Pulmonary neg pulmonary ROS,    Pulmonary exam normal       Cardiovascular negative cardio ROS      Neuro/Psych negative neurological ROS     GI/Hepatic negative GI ROS, Neg liver ROS,   Endo/Other  Morbid obesity  Renal/GU negative Renal ROS     Musculoskeletal   Abdominal (+) + obese,   Peds  Hematology negative hematology ROS (+)   Anesthesia Other Findings   Reproductive/Obstetrics (+) Pregnancy                             Anesthesia Physical Anesthesia Plan  ASA: III  Anesthesia Plan: Epidural   Post-op Pain Management:    Induction:   Airway Management Planned:   Additional Equipment:   Intra-op Plan:   Post-operative Plan:   Informed Consent: I have reviewed the patients History and Physical, chart, labs and discussed the procedure including the risks, benefits and alternatives for the proposed anesthesia with the patient or authorized representative who has indicated his/her understanding and acceptance.     Plan Discussed with:   Anesthesia Plan Comments:         Anesthesia Quick Evaluation  

## 2013-06-07 NOTE — Op Note (Signed)
Cesarean Section Procedure Note  Indications: failure to progress: arrest of dilation and Fetal Tachycardia  Pre-operative Diagnosis: 37 week 3 day pregnancy. SUA GDM  Post-operative Diagnosis: same  Surgeon: Lenoard Aden   Assistants: Arita Miss, CNM  Anesthesia: Epidural anesthesia and Local anesthesia 0.25.% bupivacaine  ASA Class: 2  Procedure Details  The patient was seen in the Holding Room. The risks, benefits, complications, treatment options, and expected outcomes were discussed with the patient.  The patient concurred with the proposed plan, giving informed consent. The risks of anesthesia, infection, bleeding and possible injury to other organs discussed. Injury to bowel, bladder, or ureter with possible need for repair discussed. Possible need for transfusion with secondary risks of hepatitis or HIV acquisition discussed. Post operative complications to include but not limited to DVT, PE and Pneumonia noted. The site of surgery properly noted/marked. The patient was taken to Operating Room # 2, identified as Cambrie Sonnenfeld and the procedure verified as C-Section Delivery. A Time Out was held and the above information confirmed.  After induction of anesthesia, the patient was draped and prepped in the usual sterile manner. A Pfannenstiel incision was made and carried down through the subcutaneous tissue to the fascia. Fascial incision was made and extended transversely using Mayo scissors. The fascia was separated from the underlying rectus tissue superiorly and inferiorly. The peritoneum was identified and entered. Peritoneal incision was extended longitudinally. The utero-vesical peritoneal reflection was incised transversely and the bladder flap was bluntly freed from the lower uterine segment. A low transverse uterine incision(Kerr hysterotomy) was made. Delivered from vertex presentation was a  female with Apgar scores of 9 at one minute and 9 at five minutes. Bulb suctioning  gently performed. Neonatal team in attendance.After the umbilical cord was clamped and cut cord blood was obtained for evaluation. The placenta was removed intact and appeared normal. The uterus was curetted with a dry lap pack. Good hemostasis was noted.The uterine outline, tubes and ovaries appeared normal. The uterine incision was closed with running locked sutures of 0 Monocryl x 2 layers. Hemostasis was observed. Lavage was carried out until clear.The [place] layer was closed with a  running 2-0 plain suture after The fascia was reapproximated with running sutures of 0 Monocryl. The skin was reapproximated with staples.  Instrument, sponge, and needle counts were correct prior the abdominal closure and at the conclusion of the case.   Findings: FTLM, posterior placenta, omental adhesions to anterior abdominal wall.  Estimated Blood Loss:  300 mL         Drains: foley                 Specimens: placenta to path                 Complications:  None; patient tolerated the procedure well.         Disposition: PACU - hemodynamically stable.         Condition: stable  Attending Attestation: I performed the procedure.

## 2013-06-07 NOTE — Anesthesia Procedure Notes (Signed)
Epidural Patient location during procedure: OB Start time: 06/07/2013 2:36 PM End time: 06/07/2013 2:40 PM  Staffing Anesthesiologist: Sandrea Hughs Performed by: anesthesiologist   Preanesthetic Checklist Completed: patient identified, surgical consent, pre-op evaluation, timeout performed, IV checked, risks and benefits discussed and monitors and equipment checked  Epidural Patient position: sitting Prep: site prepped and draped and DuraPrep Patient monitoring: continuous pulse ox and blood pressure Approach: midline Injection technique: LOR air  Needle:  Needle type: Tuohy  Needle gauge: 17 G Needle length: 9 cm and 9 Needle insertion depth: 8 cm Catheter type: closed end flexible Catheter size: 19 Gauge Catheter at skin depth: 13 cm Test dose: negative and Other  Assessment Sensory level: T9 Events: blood not aspirated, injection not painful, no injection resistance, negative IV test and no paresthesia  Additional Notes Reason for block:procedure for pain

## 2013-06-07 NOTE — Progress Notes (Signed)
Mia Parker is a 42 y.o. G2P0010 at [redacted]w[redacted]d by LMP admitted for induction of labor due to Gestational diabetes and SUA.  Subjective: Comfortable  Objective: BP 138/76  Pulse 111  Temp(Src) 98.9 F (37.2 C) (Oral)  Resp 18  Ht 5\' 9"  (1.753 m)  Wt 130.636 kg (288 lb)  BMI 42.51 kg/m2  SpO2 97%  LMP 09/28/2012   Total I/O In: -  Out: 2000 [Urine:2000]  FHT:  FHR: 160-170 bpm, variability: moderate,  accelerations:  Present,  decelerations:  Absent UC:   regular, every 2 minutes SVE:   6-7/100/0 Adequate by IUPC  Labs: Lab Results  Component Value Date   WBC 17.3* 06/06/2013   HGB 13.9 06/06/2013   HCT 39.6 06/06/2013   MCV 83.4 06/06/2013   PLT 305 06/06/2013    Assessment / Plan: SUA GDM Active phase arrest Asynclitic presentation  Labor: Active phase arrest Preeclampsia:  labs stable Fetal Wellbeing:  Category I Pain Control:  Epidural I/D:  n/a Anticipated MOD:  Proceed with cesarean section. OR notified. Consent explained and signed.  Mia Parker J 06/07/2013, 10:35 PM

## 2013-06-08 ENCOUNTER — Telehealth: Payer: Self-pay | Admitting: Family Medicine

## 2013-06-08 ENCOUNTER — Encounter (HOSPITAL_COMMUNITY): Payer: Self-pay | Admitting: Obstetrics and Gynecology

## 2013-06-08 LAB — CBC
HCT: 36.3 % (ref 36.0–46.0)
Hemoglobin: 12.6 g/dL (ref 12.0–15.0)
MCHC: 34.7 g/dL (ref 30.0–36.0)
WBC: 18.9 10*3/uL — ABNORMAL HIGH (ref 4.0–10.5)

## 2013-06-08 LAB — GLUCOSE, CAPILLARY: Glucose-Capillary: 116 mg/dL — ABNORMAL HIGH (ref 70–99)

## 2013-06-08 MED ORDER — NALOXONE HCL 0.4 MG/ML IJ SOLN: 0.4000 mg | INTRAMUSCULAR | Status: DC | PRN

## 2013-06-08 MED ORDER — SIMETHICONE 80 MG PO CHEW
80.0000 mg | CHEWABLE_TABLET | Freq: Three times a day (TID) | ORAL | Status: DC
  Administered 2013-06-08 – 2013-06-10 (×6): 80 mg via ORAL

## 2013-06-08 MED ORDER — WITCH HAZEL-GLYCERIN EX PADS: 1.0000 "application " | MEDICATED_PAD | CUTANEOUS | Status: DC | PRN

## 2013-06-08 MED ORDER — SIMETHICONE 80 MG PO CHEW: 80.0000 mg | CHEWABLE_TABLET | ORAL | Status: DC | PRN

## 2013-06-08 MED ORDER — TETANUS-DIPHTH-ACELL PERTUSSIS 5-2.5-18.5 LF-MCG/0.5 IM SUSP: 0.5000 mL | Freq: Once | INTRAMUSCULAR | Status: DC

## 2013-06-08 MED ORDER — METHYLERGONOVINE MALEATE 0.2 MG/ML IJ SOLN: 0.2000 mg | INTRAMUSCULAR | Status: DC | PRN

## 2013-06-08 MED ORDER — DIPHENHYDRAMINE HCL 25 MG PO CAPS: 25.0000 mg | ORAL_CAPSULE | ORAL | Status: DC | PRN

## 2013-06-08 MED ORDER — NALBUPHINE HCL 10 MG/ML IJ SOLN
5.0000 mg | INTRAMUSCULAR | Status: DC | PRN
  Filled 2013-06-08: qty 1

## 2013-06-08 MED ORDER — METHYLERGONOVINE MALEATE 0.2 MG PO TABS: 0.2000 mg | ORAL_TABLET | ORAL | Status: DC | PRN

## 2013-06-08 MED ORDER — SENNOSIDES-DOCUSATE SODIUM 8.6-50 MG PO TABS
2.0000 | ORAL_TABLET | ORAL | Status: DC
  Administered 2013-06-09 – 2013-06-10 (×2): 2 via ORAL

## 2013-06-08 MED ORDER — ONDANSETRON HCL 4 MG/2ML IJ SOLN: 4.0000 mg | INTRAMUSCULAR | Status: DC | PRN

## 2013-06-08 MED ORDER — PRENATAL MULTIVITAMIN CH
1.0000 | ORAL_TABLET | Freq: Every day | ORAL | Status: DC
  Administered 2013-06-08 – 2013-06-10 (×3): 1 via ORAL
  Filled 2013-06-08 (×3): qty 1

## 2013-06-08 MED ORDER — ZOLPIDEM TARTRATE 5 MG PO TABS: 5.0000 mg | ORAL_TABLET | Freq: Every evening | ORAL | Status: DC | PRN

## 2013-06-08 MED ORDER — OXYCODONE-ACETAMINOPHEN 5-325 MG PO TABS
1.0000 | ORAL_TABLET | ORAL | Status: DC | PRN
  Administered 2013-06-09 – 2013-06-10 (×4): 1 via ORAL
  Filled 2013-06-08 (×4): qty 1

## 2013-06-08 MED ORDER — ONDANSETRON HCL 4 MG PO TABS: 4.0000 mg | ORAL_TABLET | ORAL | Status: DC | PRN

## 2013-06-08 MED ORDER — KETOROLAC TROMETHAMINE 60 MG/2ML IM SOLN
INTRAMUSCULAR | Status: AC
  Administered 2013-06-08: 60 mg
  Filled 2013-06-08: qty 2

## 2013-06-08 MED ORDER — HYDROMORPHONE HCL PF 1 MG/ML IJ SOLN
INTRAMUSCULAR | Status: AC
  Filled 2013-06-08: qty 1

## 2013-06-08 MED ORDER — METOCLOPRAMIDE HCL 5 MG/ML IJ SOLN: 10.0000 mg | Freq: Three times a day (TID) | INTRAMUSCULAR | Status: DC | PRN

## 2013-06-08 MED ORDER — LACTATED RINGERS IV SOLN
INTRAVENOUS | Status: DC
  Administered 2013-06-08: 07:00:00 via INTRAVENOUS

## 2013-06-08 MED ORDER — OXYTOCIN 40 UNITS IN LACTATED RINGERS INFUSION - SIMPLE MED: 62.5000 mL/h | INTRAVENOUS | Status: AC

## 2013-06-08 MED ORDER — DIPHENHYDRAMINE HCL 50 MG/ML IJ SOLN: 25.0000 mg | INTRAMUSCULAR | Status: DC | PRN

## 2013-06-08 MED ORDER — NALOXONE HCL 1 MG/ML IJ SOLN
1.0000 ug/kg/h | INTRAVENOUS | Status: DC | PRN
  Filled 2013-06-08: qty 2

## 2013-06-08 MED ORDER — ONDANSETRON HCL 4 MG/2ML IJ SOLN: 4.0000 mg | Freq: Three times a day (TID) | INTRAMUSCULAR | Status: DC | PRN

## 2013-06-08 MED ORDER — DIBUCAINE 1 % RE OINT: 1.0000 "application " | TOPICAL_OINTMENT | RECTAL | Status: DC | PRN

## 2013-06-08 MED ORDER — SODIUM CHLORIDE 0.9 % IJ SOLN: 3.0000 mL | INTRAMUSCULAR | Status: DC | PRN

## 2013-06-08 MED ORDER — SCOPOLAMINE 1 MG/3DAYS TD PT72
MEDICATED_PATCH | TRANSDERMAL | Status: AC
  Filled 2013-06-08: qty 1

## 2013-06-08 MED ORDER — DIPHENHYDRAMINE HCL 50 MG/ML IJ SOLN: 12.5000 mg | INTRAMUSCULAR | Status: DC | PRN

## 2013-06-08 MED ORDER — LANOLIN HYDROUS EX OINT: 1.0000 "application " | TOPICAL_OINTMENT | CUTANEOUS | Status: DC | PRN

## 2013-06-08 MED ORDER — SIMETHICONE 80 MG PO CHEW
80.0000 mg | CHEWABLE_TABLET | ORAL | Status: DC
  Administered 2013-06-09 – 2013-06-10 (×3): 80 mg via ORAL

## 2013-06-08 MED ORDER — 0.9 % SODIUM CHLORIDE (POUR BTL) OPTIME
TOPICAL | Status: DC | PRN
  Administered 2013-06-07: 1000 mL

## 2013-06-08 MED ORDER — MENTHOL 3 MG MT LOZG: 1.0000 | LOZENGE | OROMUCOSAL | Status: DC | PRN

## 2013-06-08 MED ORDER — IBUPROFEN 600 MG PO TABS
600.0000 mg | ORAL_TABLET | Freq: Four times a day (QID) | ORAL | Status: DC
  Administered 2013-06-08 – 2013-06-10 (×11): 600 mg via ORAL
  Filled 2013-06-08 (×11): qty 1

## 2013-06-08 MED ORDER — DIPHENHYDRAMINE HCL 25 MG PO CAPS: 25.0000 mg | ORAL_CAPSULE | Freq: Four times a day (QID) | ORAL | Status: DC | PRN

## 2013-06-08 NOTE — Progress Notes (Signed)
POD # 1  Subjective: Pt reports feeling good/ Pain controlled with long acting narcotic and motrin Tolerating po/ Foley to SD/ No n/v/ Flatus no Activity: up with assistance Bleeding is light Newborn info:  Information for the patient's newborn:  Kelse, Ploch [[id number]]  female  / Circumcision: planning/ Feeding: breast   Objective:  VS:  Filed Vitals:   06/08/13 0500 06/08/13 0625 06/08/13 0628 06/08/13 0631  BP: 110/69 134/66 120/79 109/66  Pulse: 108 104 118 140  Temp: 98.9 F (37.2 C) 98.6 F (37 C)    TempSrc:      Resp: 18 18    Height:      Weight:      SpO2: 94% 98%       I&O: Intake/Output     09/23 0701 - 09/24 0700 09/24 0701 - 09/25 0700   I.V. (mL/kg) 2125 (16.3)    Total Intake(mL/kg) 2125 (16.3)    Urine (mL/kg/hr) 5125 (1.6)    Blood 600 (0.2)    Total Output 5725     Net -3600             Recent Labs  06/06/13 2040 06/08/13 0605  WBC 17.3* 18.9*  HGB 13.9 12.6  HCT 39.6 36.3  PLT 305 267    Blood type: --/--/B POS, B POS (09/22 2040) Rubella: Immune (03/12 0000)    Physical Exam:  General: alert and cooperative CV: Regular rate and rhythm Resp: clear Abdomen: soft, nontender, normal bowel sounds Incision: healing well, no erythema, no hernia, no seroma, no swelling, well approximated, scant red drainage, honeycomb dressing intact Uterine Fundus: firm, below umbilicus, nontender Lochia: minimal Ext: edema trace to feet and Homans sign is negative, no sign of DVT    Assessment: POD # 1/ G2P1011/ S/P C/Section d/t arrest of dilation, fetal tachycardia Doing well  Plan: Ambulate Continue routine post op orders   Signed: Donette Larry, N, MSN, CNM 06/08/2013, 9:13 AM

## 2013-06-08 NOTE — Anesthesia Postprocedure Evaluation (Signed)
  Anesthesia Post-op Note  Patient: Mia Parker  Procedure(s) Performed: Procedure(s): Primary Cesarean Section Delivery Baby  Boy @ 2317, Apgars    (N/A)  Patient is awake, responsive, moving her legs, and has signs of resolution of her numbness. Pain and nausea are reasonably well controlled. Vital signs are stable and clinically acceptable. Oxygen saturation is clinically acceptable. There are no apparent anesthetic complications at this time. Patient is ready for discharge.

## 2013-06-08 NOTE — Anesthesia Postprocedure Evaluation (Signed)
  Anesthesia Post-op Note  Patient: Mia Parker  Procedure(s) Performed: Procedure(s): Primary Cesarean Section Delivery Baby  Boy @ 2317, Apgars    (N/A)  Patient Location: Mother/Baby  Anesthesia Type:Epidural  Level of Consciousness: awake  Airway and Oxygen Therapy: Patient Spontanous Breathing  Post-op Pain: none  Post-op Assessment: Patient's Cardiovascular Status Stable, Respiratory Function Stable, Patent Airway, No signs of Nausea or vomiting, Adequate PO intake, Pain level controlled, No headache, No backache, No residual numbness and No residual motor weakness  Post-op Vital Signs: Reviewed and stable  Complications: No apparent anesthesia complications

## 2013-06-08 NOTE — Transfer of Care (Signed)
Immediate Anesthesia Transfer of Care Note  Patient: Mia Parker  Procedure(s) Performed: Procedure(s): Primary Cesarean Section Delivery Baby  Boy @ 2317, Apgars    (N/A)  Patient Location: PACU  Anesthesia Type:Epidural  Level of Consciousness: awake, alert , oriented and patient cooperative  Airway & Oxygen Therapy: Patient Spontanous Breathing  Post-op Assessment: Report given to PACU RN and Post -op Vital signs reviewed and stable  Post vital signs: Reviewed and stable  Complications: No apparent anesthesia complications

## 2013-06-08 NOTE — Telephone Encounter (Signed)
Pt called and stated that she had a baby boy yesterday. She is requesting that her new son be your pt. Please inform.

## 2013-06-09 NOTE — Progress Notes (Signed)
POSTOPERATIVE DAY # 2 S/P cesarean section   S:         Reports feeling well             Tolerating po intake / no nausea / no vomiting / + flatus / no BM             Bleeding is light             Pain controlled with motrin and percocet             Up ad lib / ambulatory/ voiding QS  Newborn breast feeding  / Circumcision done   O:  VS: BP 112/76  Pulse 79  Temp(Src) 97.9 F (36.6 C) (Oral)  Resp 18  Ht 5\' 9"  (1.753 m)  Wt 130.636 kg (288 lb)  BMI 42.51 kg/m2  SpO2 96%  LMP 09/28/2012   LABS:               Recent Labs  06/06/13 2040 06/08/13 0605  WBC 17.3* 18.9*  HGB 13.9 12.6  PLT 305 267               Bloodtype: --/--/B POS, B POS (09/22 2040)  Rubella: Immune (03/12 0000)                                  Physical Exam:             Alert and Oriented X3  Lungs: Clear and unlabored  Heart: regular rate and rhythm / no mumurs  Abdomen: soft, non-tender, non-distended actiev BS             Fundus: firm, non-tender, Ueven             Dressing intact              Incision:  approximated with staples / no erythema / no ecchymosis / no drainage  Perineum: no edema  Lochia: light  Extremities: 1+ dependent edema, no calf pain or tenderness, negative Homans  A:        POD # 2 S/P CS            Continue care management  P:        Routine postoperative care              Anticipate DC tomorrow   Marlinda Mike CNM, MSN, [facility] 06/09/2013, 9:20 AM

## 2013-06-10 ENCOUNTER — Encounter (HOSPITAL_COMMUNITY): Payer: Self-pay

## 2013-06-10 DIAGNOSIS — O1205 Gestational edema, complicating the puerperium: Secondary | ICD-10-CM | POA: Diagnosis not present

## 2013-06-10 HISTORY — DX: Gestational edema, complicating the puerperium: O12.05

## 2013-06-10 MED ORDER — IBUPROFEN 600 MG PO TABS: 600.0000 mg | ORAL_TABLET | Freq: Four times a day (QID) | ORAL | Status: DC

## 2013-06-10 MED ORDER — OXYCODONE-ACETAMINOPHEN 5-325 MG PO TABS: 1.0000 | ORAL_TABLET | ORAL | Status: DC | PRN

## 2013-06-10 MED ORDER — HYDROCHLOROTHIAZIDE 12.5 MG PO CAPS: 12.5000 mg | ORAL_CAPSULE | Freq: Every day | ORAL | Status: DC

## 2013-06-10 NOTE — Discharge Summary (Signed)
Obstetric Discharge Summary Reason for Admission: induction of labor Prenatal Procedures: NST and ultrasound Intrapartum Procedures: cesarean: low cervical, transverse Postpartum Procedures: none Complications-Operative and Postpartum: none Hemoglobin  Date Value Range Status  06/08/2013 12.6  12.0 - 15.0 g/dL Final     HCT  Date Value Range Status  06/08/2013 36.3  36.0 - 46.0 % Final    Physical Exam:  General: alert, cooperative and no distress Lochia: appropriate Uterine Fundus: firm Incision: Tegaderm and Honeycomb intact, skin approximated with suture DVT Evaluation: No evidence of DVT seen on physical exam. Negative Homan's sign. No cords or calf tenderness. Calf/Ankle 2+ edema is present.  Discharge Diagnoses: Term Pregnancy-delivered         Postpartum edema Discharge Information: Date: 06/10/2013 Activity: pelvic rest and no driving for 2 weeks or while using narcotic drugs Diet: routine Medications: PNV, Ibuprofen and Percocet Condition: stable Instructions: refer to practice specific booklet Discharge to: home - room-in with infant Follow-up Information   Follow up with Lenoard Aden, MD. Schedule an appointment as soon as possible for a visit in 6 weeks. (Call for staple removal on Tuesday 06/14/13)    Specialty:  Obstetrics and Gynecology   Contact information:   [address] [place] [address] [phone]       Newborn Data: Live born female on 06/07/13 Birth Weight: 7 lb 13.4 oz (3555 g) APGAR: 8, 9  Infant on double bili lights - possible d/c tomorrow  Kenard Gower, MSN, CNM 06/10/2013, 12:10 PM

## 2013-06-10 NOTE — Progress Notes (Signed)
Patient ID: Mia Parker, female   DOB: 03-Apr-1971, 42 y.o.   MRN: [id number] Subjective: S/P Cesarean Delivery for failure to progress and arrest of descent POD# 3 Information for the patient's newborn:  Anela, Bensman [[id number]]  female  / circ done  Reports feeling well Feeding: breast - baby is sleepy feeder on double bili lights Patient reports tolerating PO.  Breast symptoms: none Pain controlled with ibuprofen (OTC) and narcotic analgesics including Percocet Denies HA/SOB/C/P/N/V/dizziness. Flatus present, no BM. She reports vaginal bleeding as normal, without clots.  She is ambulating, urinating without difficult.     Objective:   VS:  Filed Vitals:   06/09/13 0030 06/09/13 0600 06/09/13 1842 06/10/13 0625  BP: 98/60 112/76 122/72 122/72  Pulse: 93 79 89 84  Temp: 98 F (36.7 C) 97.9 F (36.6 C) 98.2 F (36.8 C) 97.5 F (36.4 C)  TempSrc: Oral  Oral Oral  Resp: 18 18 18 19   Height:      Weight:      SpO2: 96%   99%      Recent Labs  06/08/13 0605  WBC 18.9*  HGB 12.6  HCT 36.3  PLT 267     Blood type: --/--/B POS, B POS (09/22 2040)  Rubella: Immune (03/12 0000)     Physical Exam:  General: alert, cooperative and no distress CV: Regular rate and rhythm, S1S2 present or without murmur or extra heart sounds Resp: clear Abdomen: soft, nontender, normal bowel sounds Incision: Tegaderm and Honeycomb intact, small area of drainage outlined Uterine Fundus: firm, 3FB below umbilicus, nontender Lochia: minimal Ext: edema 2+ and Homans sign is negative, no sign of DVT      Assessment/Plan: 42 y.o.   POD# 3.  s/p Cesarean Delivery.  Indications: failure to progress and arrest of descent                Active Problems:   Postpartum care following cesarean delivery (9/23)  Doing well, stable.               Advance diet as tolerated Ambulate Routine post-op care D/C;  room-in with infant  Kenard Gower, MSN, CNM 06/10/2013, 12:02 PM

## 2013-06-10 NOTE — Plan of Care (Signed)
Problem: Discharge Progression Outcomes Goal: Remove staples per MD order Outcome: Not Met (add Reason) Pt has follow up with MD office on Tuesday Sept. 30th. For staple removal

## 2013-06-10 NOTE — Lactation Note (Signed)
This note was copied from the chart of Mia Kinesha Auten. Lactation Consultation Note  Patient Name: Mia Parker [id number] Date: 06/10/2013 Reason for consult: Follow-up assessment Mother having problems with latching independently. Baby has not been sustaining the latch, slipping off and becoming very fussy. Nipple shield is present but has not been used. RN was assisting with shield without success and requested LC to evaluate size of shield. RN reports that she was able to get the baby latched well yesterday without the shield. Today, breast tissue compressed and asymmetrical latch resulted in deep latch and baby staying sustained. He has been actively feeding for 10 minutes with swallows. Patient's mother at bedside and wanting to assist. Patient is receptive to additional assist as latching is challenging at this time. Hand expression done on the other breast into a foley cup and will give to baby follow the feeding for additional calories. Baby remains on double phototherapy. One light to baby's back during the feeding. RN wiil set up a DEBP for the mother to pump in conjunction with hand expression. RN and [facility] will assist with breastfeeding during the stay. Progress with feeding was noted.  Maternal Data    Feeding Feeding Type: Breast Milk  LATCH Score/Interventions Latch: Repeated attempts needed to sustain latch, nipple held in mouth throughout feeding, stimulation needed to elicit sucking reflex. Intervention(s): Skin to skin Intervention(s): Assist with latch;Breast compression;Breast massage  Audible Swallowing: A few with stimulation Intervention(s): Skin to skin Intervention(s): Alternate breast massage;Hand expression  Type of Nipple: Everted at rest and after stimulation  Comfort (Breast/Nipple): Soft / non-tender     Hold (Positioning): Assistance needed to correctly position infant at breast and maintain latch.  LATCH Score: 7  Lactation Tools  Discussed/Used Pump Review: Setup, frequency, and cleaning Initiated by::  Lucy Chris, RN per request of [facility]) Date initiated:: 06/10/13   Consult Status Consult Status: Follow-up (will need assist at next feeding) Date: 06/11/13 Follow-up type: In-patient    Mia Parker 06/10/2013, 9:51 AM

## 2013-07-04 ENCOUNTER — Ambulatory Visit (HOSPITAL_COMMUNITY)
Admission: RE | Admit: 2013-07-04 | Discharge: 2013-07-04 | Disposition: A | Discharge status: Home / Self Care | Payer: 59 | Source: Ambulatory Visit | Attending: Family Medicine | Admitting: Family Medicine

## 2013-07-04 NOTE — Lactation Note (Signed)
Adult Lactation Consultation Outpatient Visit Note  Patient Name: Mia Parker(mother)    BABY: ISSAC ALDRIDGE Date of Birth: [birth year]                             DOB: [birth year] Gestational Age at Delivery: 37 weeks      BIRTH WEIGHT: 7-13 Type of Delivery: C/S                                 WEIGHT TODAY: 9-1.6  Breastfeeding History: Frequency of Breastfeeding: EVERY 2.5-3 HOURS DURING DAY AND EVERY 3-4HOURS DURING NIGHT Length of Feeding: 10-45 MINUTES USUALLY BOTH BREASTS Voids: QS Stools: QS YELLOW  Supplementing / Method:EMB PLUS 4 OZ OF FORMULA/24 HOURS PER BOTTLE Pumping:  Type of Pump:MEDELA PUMP IN STYLE   Frequency:POST PUMP DAYTIME FEEDINGS  Volume:  15-80 MLS  Comments:    Consultation Evaluation:Mom and her 31 week old infant here for feeding assessment.  This is her first baby and she does have a history of infertility and diet controlled. GDM.  Baby was delivered at 37 weeks and required phototherapy while in hospital.  Mom states baby latched well in hospital and continues to latch well.  Mom's concern is baby desires to eat frequently and becomes sleepy at breast which has led to formula supplementation.  She states she was giving 8-10 oz/24 hours the first 3 weeks but she has decreased amount to 4 oz/24 hours this past week.  Mom also post pumping and giving baby EBM when he still acts hungry.  Discussed late preterm feeding behaviors.  Great weight gain.  Observed baby latch well and nurse actively for 5-6 minutes then baby becomes non-nutritive and requires stimulation and breast massage.  Milk easily sprays from breast with hand expression and good swallows/gulping audible when baby is active.  Discussed growth spurts and supply/demand.  Recommended mom continue to feed on cue and try to eliminate formula and check weight in 1 week.  Instructed to continue daytime post pumping and giving baby any expressed milk.  Mom will check weight at support group in 1 week and call  for OP follow up in 2 weeks.  Initial Feeding Assessment:15 MINUTES ON EACH BREAST Pre-feed [id number] Post-feed [id number] Amount Transferred:66 MLS Comments:  Additional Feeding Assessment: Pre-feed Weight: Post-feed Weight: Amount Transferred: Comments:  Additional Feeding Assessment: Pre-feed Weight: Post-feed Weight: Amount Transferred: Comments:  Total Breast milk Transferred this Visit: 66 MLS Total Supplement Given: NONE  Additional Interventions:   Follow-up:  WEIGHT CHECK IN 1 WEEK AT BREASTFEEDING SUPPORT GROUP.  MOM TO CALL FOR FOLLOW UP OP APPOINTMENT FOLLOWING WEEK.      Hansel Feinstein 07/04/2013, 10:20 AM

## 2013-12-19 ENCOUNTER — Ambulatory Visit: Payer: 59 | Admitting: Family Medicine

## 2013-12-21 ENCOUNTER — Encounter: Payer: Self-pay | Admitting: Family Medicine

## 2013-12-21 ENCOUNTER — Ambulatory Visit (INDEPENDENT_AMBULATORY_CARE_PROVIDER_SITE_OTHER): Payer: 59 | Admitting: Family Medicine

## 2013-12-21 VITALS — BP 130/82 | HR 80 | Wt 284.0 lb

## 2013-12-21 DIAGNOSIS — F3289 Other specified depressive episodes: Secondary | ICD-10-CM

## 2013-12-21 DIAGNOSIS — R5381 Other malaise: Secondary | ICD-10-CM

## 2013-12-21 DIAGNOSIS — R5383 Other fatigue: Secondary | ICD-10-CM

## 2013-12-21 DIAGNOSIS — F53 Postpartum depression: Secondary | ICD-10-CM

## 2013-12-21 DIAGNOSIS — O99345 Other mental disorders complicating the puerperium: Secondary | ICD-10-CM

## 2013-12-21 DIAGNOSIS — F329 Major depressive disorder, single episode, unspecified: Secondary | ICD-10-CM

## 2013-12-21 LAB — COMPREHENSIVE METABOLIC PANEL
ALBUMIN: 4.1 g/dL (ref 3.5–5.2)
ALT: 14 U/L (ref 0–35)
AST: 18 U/L (ref 0–37)
Alkaline Phosphatase: 104 U/L (ref 39–117)
BUN: 9 mg/dL (ref 6–23)
CALCIUM: 9.8 mg/dL (ref 8.4–10.5)
CHLORIDE: 101 meq/L (ref 96–112)
CO2: 22 mEq/L (ref 19–32)
Creat: 0.68 mg/dL (ref 0.50–1.10)
Glucose, Bld: 125 mg/dL — ABNORMAL HIGH (ref 70–99)
POTASSIUM: 4 meq/L (ref 3.5–5.3)
SODIUM: 135 meq/L (ref 135–145)
Total Bilirubin: 0.4 mg/dL (ref 0.2–1.2)
Total Protein: 7.1 g/dL (ref 6.0–8.3)

## 2013-12-21 LAB — CBC WITH DIFFERENTIAL/PLATELET
BASOS ABS: 0.1 10*3/uL (ref 0.0–0.1)
BASOS PCT: 1 % (ref 0–1)
Eosinophils Absolute: 2.1 10*3/uL — ABNORMAL HIGH (ref 0.0–0.7)
Eosinophils Relative: 15 % — ABNORMAL HIGH (ref 0–5)
HEMATOCRIT: 43.2 % (ref 36.0–46.0)
Hemoglobin: 15 g/dL (ref 12.0–15.0)
Lymphocytes Relative: 26 % (ref 12–46)
Lymphs Abs: 3.6 10*3/uL (ref 0.7–4.0)
MCH: 28.7 pg (ref 26.0–34.0)
MCHC: 34.7 g/dL (ref 30.0–36.0)
MCV: 82.6 fL (ref 78.0–100.0)
MONO ABS: 1.1 10*3/uL — AB (ref 0.1–1.0)
Monocytes Relative: 8 % (ref 3–12)
NEUTROS ABS: 6.9 10*3/uL (ref 1.7–7.7)
Neutrophils Relative %: 50 % (ref 43–77)
Platelets: 360 10*3/uL (ref 150–400)
RBC: 5.23 MIL/uL — ABNORMAL HIGH (ref 3.87–5.11)
RDW: 13.8 % (ref 11.5–15.5)
WBC: 13.8 10*3/uL — AB (ref 4.0–10.5)

## 2013-12-21 LAB — TSH: TSH: 2.312 u[IU]/mL (ref 0.350–4.500)

## 2013-12-21 MED ORDER — BUPROPION HCL ER (SR) 150 MG PO TB12: 150.0000 mg | ORAL_TABLET | Freq: Two times a day (BID) | ORAL | Status: DC

## 2013-12-21 NOTE — Progress Notes (Deleted)
   Subjective:    Patient ID: Mia Parker, female    DOB: 08-18-1971, 43 y.o.   MRN: [id number]  HPI    Review of Systems     Objective:   Physical Exam        Assessment & Plan:  Postpartum depression - Plan: buPROPion (WELLBUTRIN SR) 150 MG 12 hr tablet  Fatigue - Plan: CBC with Differential, Comprehensive metabolic panel, TSH

## 2013-12-21 NOTE — Patient Instructions (Signed)
For the first week just take one of the pills probably in the morning and return here in 2 weeks. If you have any problems call me

## 2013-12-21 NOTE — Progress Notes (Addendum)
   Subjective:    Patient ID: Mia Parker, female    DOB: February 17, 1971, 43 y.o.   MRN: [id number]  CC: "I feel unmotivated and don't have any energy"  HPI: The patient states that she has been feeling chronically tired and lacking in energy since the birth of her first child 6 months ago. The patient states that she wakes up feeling tired and has to take several naps throughout the day. She also states that she cannot find motivation to do the things she needs to get done around the house such as cleaning or washing dishes. She is able to play and care for her son, however these instances often leave her feeling drained. The patient states that her symptoms have been slowly getting worse since onset however she has not noticed any triggers or situations which make her symptoms better or worse. The patient denies any suicidal or homicidal ideations and states that she feels close and well bonded with her infant. The patient has been on Wellbutrin in the past for symptoms of depression secondary to her divorce.   The patient states that she has symptoms of increased appetite, decreased libido and increased sensation of being warm and sweating more.   The patient denies any weight loss, fever, chills, cough, cold, muscle pain, weakness or changes in medications or diet.   Review of Systems  Constitutional: Positive for malaise/fatigue and diaphoresis. Negative for fever, chills and weight loss.  HENT: Negative for congestion and sore throat.   Eyes: Negative for blurred vision.  Respiratory: Negative for cough and sputum production.   Musculoskeletal: Negative for joint pain and myalgias.  Neurological: Negative for weakness and headaches.  Psychiatric/Behavioral: Negative for suicidal ideas and memory loss. The patient does not have insomnia.     Review of Systems  Constitutional: Positive for malaise/fatigue and diaphoresis. Negative for fever, chills and weight loss.  HENT: Negative for  congestion and sore throat.   Eyes: Negative for blurred vision.  Respiratory: Negative for cough and sputum production.   Musculoskeletal: Negative for joint pain and myalgias.  Neurological: Negative for weakness and headaches.  Psychiatric/Behavioral: Negative for suicidal ideas and memory loss. The patient does not have insomnia.       Objective:   Physical Exam  Constitutional: Very pleasant female, normal affect, occasionally teary Cardiac: RRR Pulm: CTAB, NWB Extremities: Normal gross appearance Neck: Normally sized thyroid without tenderness to palpation    Assessment & Plan:  Postpartum depression - Plan: buPROPion (WELLBUTRIN SR) 150 MG 12 hr tablet  Fatigue - Plan: CBC with Differential, Comprehensive metabolic panel, TSH  she will start with one or day for the first week. Then go to twice a day dosing. She is to return here in 2 weeks for followup visit.  she was seen in conjunction with Elodia Florence MS3

## 2014-01-04 ENCOUNTER — Encounter: Payer: Self-pay | Admitting: Family Medicine

## 2014-01-04 ENCOUNTER — Ambulatory Visit (INDEPENDENT_AMBULATORY_CARE_PROVIDER_SITE_OTHER): Payer: 59 | Admitting: Family Medicine

## 2014-01-04 VITALS — BP 130/80 | HR 68 | Ht 68.0 in | Wt 280.0 lb

## 2014-01-04 DIAGNOSIS — O99345 Other mental disorders complicating the puerperium: Secondary | ICD-10-CM

## 2014-01-04 DIAGNOSIS — F329 Major depressive disorder, single episode, unspecified: Secondary | ICD-10-CM

## 2014-01-04 DIAGNOSIS — F3289 Other specified depressive episodes: Secondary | ICD-10-CM

## 2014-01-04 DIAGNOSIS — F53 Postpartum depression: Secondary | ICD-10-CM

## 2014-01-04 NOTE — Progress Notes (Signed)
   Subjective:    Patient ID: Mia Parker, female    DOB: Nov 10, 1970, 43 y.o.   MRN: [id number]  HPI She is here for recheck. She has been on Wellbutrin for 2 weeks. She does state that she feels somewhat better. She is obsessing less and seems to be slightly more calm. She is not having any adverse reactions from the medication.   Review of Systems     Objective:   Physical Exam Alert and in no distress with appropriate affect.       Assessment & Plan:  Postpartum depression  I told her that it was encouraging for her to be even slightly better. Plan to recheck her in about one month. Discussed how long I will keep her on this medication and explained that probably it'll be a full year.

## 2014-01-12 ENCOUNTER — Ambulatory Visit (INDEPENDENT_AMBULATORY_CARE_PROVIDER_SITE_OTHER): Payer: 59 | Admitting: Family Medicine

## 2014-01-12 ENCOUNTER — Encounter: Payer: Self-pay | Admitting: Family Medicine

## 2014-01-12 VITALS — BP 130/80 | HR 80 | Temp 98.4°F | Wt 274.0 lb

## 2014-01-12 DIAGNOSIS — B9789 Other viral agents as the cause of diseases classified elsewhere: Principal | ICD-10-CM

## 2014-01-12 DIAGNOSIS — J069 Acute upper respiratory infection, unspecified: Secondary | ICD-10-CM

## 2014-01-12 MED ORDER — ALBUTEROL SULFATE HFA 108 (90 BASE) MCG/ACT IN AERS: 2.0000 | INHALATION_SPRAY | Freq: Four times a day (QID) | RESPIRATORY_TRACT | Status: DC | PRN

## 2014-01-12 NOTE — Progress Notes (Signed)
   Subjective:    Patient ID: Mia Parker, female    DOB: 11-Dec-1970, 43 y.o.   MRN: [id number]  HPI  Msr. Jerri Glauser is a very pleasant 43 y.o. yo female who  has a past medical history of Infertility, female; Obesity; ADD (attention deficit disorder); AMA (advanced maternal age) multigravida 35+; and Postpartum edema (06/10/2013). She presents today for "bad cough."  4 days ago the patient began to have symptoms of coughing and a pressure of sinus fullness. Yesterday and today the patient has begun coughing a lot more and the cough has become productive of clear and yellow sputum. The patient notes other symptoms including left ear fullness, very sore throat, chest congestion and occasional subjective fever. The patient denies any chills or tooth pain. She does not smoke.  Review of Systems is negative except per HPI.    Objective:   Physical Exam  Constitutional: Patient is well-developed, well-nourished, and in no distress. HENT: Head is normocephalic and atraumatic.  Mouth/Throat: Oropharynx is erythematous without exudates Eyes: Conjunctivae and EOM are normal. Pupils are equal, round, and reactive to light.  Ears: Unable to visualize left tympanic membrane, right tympanic membrane wnl. Cardiovascular: Normal rate, regular rhythm. Exam reveals no murmurs, gallops and no friction rub.  Pulmonary/Chest: Effort normal with expiratory wheezes most prominent in the left lower and middle lung fields. No stridor. Neurological: Patient is alert and oriented to person, place, and time.  Psychiatric: Affect normal.   Assessment & Plan:   Viral URI with cough - Plan: albuterol (PROVENTIL HFA;VENTOLIN HFA) 108 (90 BASE) MCG/ACT inhaler  The patient's symptoms are most consistent with an acute viral illness and therefore do not warrant antibiotic intervention at this time. Will prescribe albuterol inhaler to hopefully help alleviate cough and wheezing.

## 2014-03-07 ENCOUNTER — Ambulatory Visit: Payer: Self-pay | Admitting: Family Medicine

## 2014-03-09 ENCOUNTER — Encounter: Payer: Self-pay | Admitting: Family Medicine

## 2014-03-09 ENCOUNTER — Ambulatory Visit (INDEPENDENT_AMBULATORY_CARE_PROVIDER_SITE_OTHER): Payer: 59 | Admitting: Family Medicine

## 2014-03-09 VITALS — BP 100/72 | Wt 279.0 lb

## 2014-03-09 DIAGNOSIS — F901 Attention-deficit hyperactivity disorder, predominantly hyperactive type: Secondary | ICD-10-CM

## 2014-03-09 DIAGNOSIS — F53 Postpartum depression: Secondary | ICD-10-CM

## 2014-03-09 DIAGNOSIS — F329 Major depressive disorder, single episode, unspecified: Secondary | ICD-10-CM

## 2014-03-09 DIAGNOSIS — F3289 Other specified depressive episodes: Secondary | ICD-10-CM

## 2014-03-09 DIAGNOSIS — F909 Attention-deficit hyperactivity disorder, unspecified type: Secondary | ICD-10-CM

## 2014-03-09 DIAGNOSIS — O99345 Other mental disorders complicating the puerperium: Secondary | ICD-10-CM

## 2014-03-09 MED ORDER — AMPHETAMINE-DEXTROAMPHET ER 25 MG PO CP24: 25.0000 mg | ORAL_CAPSULE | Freq: Two times a day (BID) | ORAL | Status: DC

## 2014-03-09 NOTE — Progress Notes (Deleted)
   Subjective:    Patient ID: Mia Parker, female    DOB: [birth year], 43 y.o.   MRN: [id number]  HPI    Review of Systems     Objective:   Physical Exam        Assessment & Plan:

## 2014-03-09 NOTE — Progress Notes (Signed)
   Subjective:    Patient ID: Mia Parker, female    DOB: 1970/10/30, 43 y.o.   MRN: [id number]  HPI  Patient is a 42 y.o. female presenting for medicine check.   Postpartum Depression - patient was started on Wellbutrin about 2 months ago. Currently at 151m BID, reports compliance with medication. Patient reports that her energy level has improved significantly but still has mood swings, has some crying spells at time. Overall she feels 55-60% better. Patient denies suicidal or homicidal ideation. Denies adverse effects from medications including diarrhea, headaches, chest pain, tremors, weight loss. She reports having good support at home with her husband. Feels supported with her family and friends. Currently working part time in the evenings. She gets sleep when she can but feels it is adequate. Appetite is good. She is now no longer breast-feeding. I then brought up her ADD medication. She has not been on this in quite some time but does feel quite frustrated of not being able to get in and done in a timely manner. She does work 4-1/2 hours at [employer]  Review of Systems  As in subjective.     Objective:   Physical Exam  Filed Vitals:   03/09/14 1532  BP: 100/72    General appearence: alert, no distress, WD/WN,   Assessment & Plan:  Postpartum depression  Attention-deficit hyperactivity disorder, predominantly hyperactive type - Plan: amphetamine-dextroamphetamine (ADDERALL XR) 25 MG 24 hr capsule  continue on present Wellbutrin. I will also start her back on her Adderall. She can use either once or twice per day depending upon whether she is working and or just taking care of her child. She will recheck with me in one month. We'll then decide whether to readjust her medications to

## 2014-03-13 ENCOUNTER — Other Ambulatory Visit: Payer: Self-pay | Admitting: Family Medicine

## 2014-04-04 ENCOUNTER — Telehealth: Payer: Self-pay | Admitting: Family Medicine

## 2014-04-04 NOTE — Telephone Encounter (Signed)
Pt called and requested adderall xr please call pt at [phone] when ready

## 2014-04-05 NOTE — Telephone Encounter (Signed)
Left message for pt to call. Pt needs a appt schedule per Audelia Acton.

## 2014-04-05 NOTE — Telephone Encounter (Signed)
Pt made an appt next week to see jcl . Please refill med

## 2014-04-05 NOTE — Telephone Encounter (Signed)
Dr. Redmond School saw her 6/25 and advised f/u in 55mo so have her make f/u appt.

## 2014-04-11 MED ORDER — BUPROPION HCL ER (SR) 150 MG PO TB12: ORAL_TABLET | ORAL | Status: DC

## 2014-04-11 NOTE — Telephone Encounter (Signed)
Requesting 90 days for bupropion

## 2014-04-12 ENCOUNTER — Encounter: Payer: Self-pay | Admitting: Family Medicine

## 2014-04-12 ENCOUNTER — Ambulatory Visit (INDEPENDENT_AMBULATORY_CARE_PROVIDER_SITE_OTHER): Payer: 59 | Admitting: Family Medicine

## 2014-04-12 VITALS — BP 130/90 | HR 100 | Wt 264.0 lb

## 2014-04-12 DIAGNOSIS — F3289 Other specified depressive episodes: Secondary | ICD-10-CM

## 2014-04-12 DIAGNOSIS — F329 Major depressive disorder, single episode, unspecified: Secondary | ICD-10-CM

## 2014-04-12 DIAGNOSIS — F53 Postpartum depression: Secondary | ICD-10-CM

## 2014-04-12 DIAGNOSIS — F909 Attention-deficit hyperactivity disorder, unspecified type: Secondary | ICD-10-CM

## 2014-04-12 DIAGNOSIS — O99345 Other mental disorders complicating the puerperium: Secondary | ICD-10-CM

## 2014-04-12 DIAGNOSIS — F901 Attention-deficit hyperactivity disorder, predominantly hyperactive type: Secondary | ICD-10-CM

## 2014-04-12 MED ORDER — AMPHETAMINE-DEXTROAMPHET ER 25 MG PO CP24: 25.0000 mg | ORAL_CAPSULE | ORAL | Status: DC

## 2014-04-12 MED ORDER — BUPROPION HCL ER (SR) 150 MG PO TB12: ORAL_TABLET | ORAL | Status: DC

## 2014-04-12 MED ORDER — AMPHETAMINE-DEXTROAMPHET ER 25 MG PO CP24: 25.0000 mg | ORAL_CAPSULE | Freq: Two times a day (BID) | ORAL | Status: DC

## 2014-04-12 NOTE — Progress Notes (Signed)
   Subjective:    Patient ID: Mia Parker, female    DOB: 1970-10-24, 43 y.o.   MRN: [id number]  HPI She is here for recheck. She now feels that she is back to her normal self. She is back on her ADD medication and is able to focus and get tasks done. She is quite happy with her progress.   Review of Systems     Objective:   Physical Exam  alert and in no distress with appropriate affect.       Assessment & Plan:  Attention-deficit hyperactivity disorder, predominantly hyperactive type - Plan: amphetamine-dextroamphetamine (ADDERALL XR) 25 MG 24 hr capsule, amphetamine-dextroamphetamine (ADDERALL XR) 25 MG 24 hr capsule, amphetamine-dextroamphetamine (ADDERALL XR) 25 MG 24 hr capsule  Postpartum depression - Plan: buPROPion (WELLBUTRIN SR) 150 MG 12 hr tablet  she will continue on her present dosing of Adderall. I will continue her on Wellbutrin through the winter months. She will then stop in the early spring and if she runs into trouble, will call me.

## 2014-07-17 ENCOUNTER — Encounter: Payer: Self-pay | Admitting: Family Medicine

## 2014-07-20 ENCOUNTER — Telehealth: Payer: Self-pay | Admitting: Internal Medicine

## 2014-07-20 DIAGNOSIS — F901 Attention-deficit hyperactivity disorder, predominantly hyperactive type: Secondary | ICD-10-CM

## 2014-07-20 NOTE — Telephone Encounter (Signed)
Pt needs a refill on adderall XR. Call when ready

## 2014-07-24 MED ORDER — AMPHETAMINE-DEXTROAMPHET ER 25 MG PO CP24: 25.0000 mg | ORAL_CAPSULE | ORAL | Status: DC

## 2014-07-24 MED ORDER — AMPHETAMINE-DEXTROAMPHET ER 25 MG PO CP24: 25.0000 mg | ORAL_CAPSULE | Freq: Two times a day (BID) | ORAL | Status: DC

## 2014-09-28 ENCOUNTER — Ambulatory Visit (INDEPENDENT_AMBULATORY_CARE_PROVIDER_SITE_OTHER): Payer: 59 | Admitting: Family Medicine

## 2014-09-28 ENCOUNTER — Encounter: Payer: Self-pay | Admitting: Family Medicine

## 2014-09-28 VITALS — BP 140/86 | HR 94 | Wt 262.0 lb

## 2014-09-28 DIAGNOSIS — M25461 Effusion, right knee: Secondary | ICD-10-CM

## 2014-09-28 DIAGNOSIS — M25561 Pain in right knee: Secondary | ICD-10-CM

## 2014-09-28 NOTE — Progress Notes (Signed)
   Subjective:    Patient ID: Mia Parker, female    DOB: 01-30-1971, 44 y.o.   MRN: [id number]  HPI She complains of a 6 week history of discomfort in her right knee however 2 days ago she felt a popping sensation with pain, swelling but no locking or grinding. No history of injury.   Review of Systems     Objective:   Physical Exam Right knee exam shows a small effusion. Exquisite medial joint line tenderness is noted. McMurray's testing positive. Anterior drawer negative. Medial and lateral collateral ligaments intact. Patellar compression was negative.       Assessment & Plan:  Knee effusion, right  Right knee pain  I explained that this could be a degenerative meniscal injury however I will treat her conservatively with NSAID's and heat. If her symptoms get worse, further evaluation including MRI and possible injection will be considered. She is comfortable with this.

## 2014-09-30 ENCOUNTER — Other Ambulatory Visit: Payer: Self-pay | Admitting: Family Medicine

## 2014-10-02 NOTE — Telephone Encounter (Signed)
Is this okay?

## 2014-10-04 ENCOUNTER — Telehealth: Payer: Self-pay | Admitting: Family Medicine

## 2014-10-04 NOTE — Telephone Encounter (Signed)
Please call, not really any better, unless she takes 12 aleve per day  She is trying not to take

## 2014-10-05 ENCOUNTER — Other Ambulatory Visit: Payer: Self-pay

## 2014-10-05 DIAGNOSIS — M25461 Effusion, right knee: Secondary | ICD-10-CM

## 2014-10-05 NOTE — Telephone Encounter (Signed)
Schedule her for an MRI

## 2014-10-05 NOTE — Telephone Encounter (Signed)
Ins.has sent this for physican review will know something in two days

## 2014-10-09 ENCOUNTER — Telehealth: Payer: Self-pay | Admitting: Family Medicine

## 2014-10-09 NOTE — Telephone Encounter (Signed)
Please call Mia Parker [phone] regarding her MRI appointment.

## 2014-10-09 NOTE — Telephone Encounter (Signed)
Pt informed we are waiting on physician review with her ins. Pt verbalized understanding

## 2014-10-10 NOTE — Telephone Encounter (Signed)
Dr.Lalonde her ins. Has denied the MRI

## 2014-10-19 ENCOUNTER — Telehealth: Payer: Self-pay | Admitting: Internal Medicine

## 2014-10-19 DIAGNOSIS — F901 Attention-deficit hyperactivity disorder, predominantly hyperactive type: Secondary | ICD-10-CM

## 2014-10-19 MED ORDER — AMPHETAMINE-DEXTROAMPHET ER 25 MG PO CP24: 25.0000 mg | ORAL_CAPSULE | ORAL | Status: DC

## 2014-10-19 MED ORDER — AMPHETAMINE-DEXTROAMPHET ER 25 MG PO CP24: 25.0000 mg | ORAL_CAPSULE | Freq: Two times a day (BID) | ORAL | Status: DC

## 2014-10-19 NOTE — Telephone Encounter (Signed)
Pt needs a refill on her adderall 55m. Call when ready @ [phone]

## 2014-10-23 ENCOUNTER — Ambulatory Visit
Admission: RE | Admit: 2014-10-23 | Discharge: 2014-10-23 | Disposition: A | Discharge status: Home / Self Care | Payer: 59 | Source: Ambulatory Visit | Attending: Family Medicine | Admitting: Family Medicine

## 2014-10-23 DIAGNOSIS — M25461 Effusion, right knee: Secondary | ICD-10-CM

## 2014-10-25 ENCOUNTER — Ambulatory Visit (INDEPENDENT_AMBULATORY_CARE_PROVIDER_SITE_OTHER): Payer: 59 | Admitting: Family Medicine

## 2014-10-25 DIAGNOSIS — M2391 Unspecified internal derangement of right knee: Secondary | ICD-10-CM

## 2014-10-25 MED ORDER — TRIAMCINOLONE ACETONIDE 40 MG/ML IJ SUSP: 40.0000 mg | Freq: Once | INTRAMUSCULAR | Status: DC

## 2014-10-25 MED ORDER — LIDOCAINE HCL (PF) 1 % IJ SOLN: 2.0000 mL | Freq: Once | INTRAMUSCULAR | Status: DC

## 2014-10-25 NOTE — Progress Notes (Signed)
   Subjective:    Patient ID: Jessicah Croll, female    DOB: 04-28-1971, 44 y.o.   MRN: [id number]  HPI She is here for follow-up visit concerning her right knee. An MRI did show lateral as well as medial meniscal damage. Also a small loose body was noted posteriorly. She does complain of some discomfort and clicking sensation.   Review of Systems     Objective:   Physical Exam Alert and in no distress. Knee exam shows no effusion.       Assessment & Plan:  Internal derangement of right knee - Plan: lidocaine (PF) (XYLOCAINE) 1 % injection 2 mL, triamcinolone acetonide (KENALOG-40) injection 40 mg  I discussed in detail the MRI results and the small loose body. Discussed various options and we have decided to do an injection. The knee was prepped with Betadine and 40 mg of Kenalog and 3 mL of Xylocaine was injected in the knee laterally. She tolerated the procedure well. She will let me know how this works to control her discomfort and clicking. If continued difficulty, orthopedic referral will be made.

## 2015-01-12 ENCOUNTER — Ambulatory Visit (INDEPENDENT_AMBULATORY_CARE_PROVIDER_SITE_OTHER): Payer: 59 | Admitting: Medical

## 2015-01-12 ENCOUNTER — Encounter: Payer: Self-pay | Admitting: Medical

## 2015-01-12 VITALS — BP 100/70 | HR 80 | Resp 15 | Wt 272.0 lb

## 2015-01-12 DIAGNOSIS — S8991XA Unspecified injury of right lower leg, initial encounter: Secondary | ICD-10-CM | POA: Diagnosis not present

## 2015-01-12 DIAGNOSIS — M23306 Other meniscus derangements, unspecified meniscus, right knee: Secondary | ICD-10-CM | POA: Diagnosis not present

## 2015-01-12 DIAGNOSIS — M23300 Other meniscus derangements, unspecified lateral meniscus, right knee: Secondary | ICD-10-CM | POA: Diagnosis not present

## 2015-01-12 DIAGNOSIS — M23303 Other meniscus derangements, unspecified medial meniscus, right knee: Secondary | ICD-10-CM | POA: Diagnosis not present

## 2015-01-12 DIAGNOSIS — M25561 Pain in right knee: Secondary | ICD-10-CM

## 2015-01-12 MED ORDER — NAPROXEN 500 MG PO TABS: 500.0000 mg | ORAL_TABLET | Freq: Two times a day (BID) | ORAL | Status: DC

## 2015-01-12 MED ORDER — HYDROCODONE-ACETAMINOPHEN 7.5-325 MG PO TABS: 1.0000 | ORAL_TABLET | Freq: Four times a day (QID) | ORAL | Status: DC | PRN

## 2015-01-12 NOTE — Progress Notes (Signed)
Subjective: Here for right knee injury.  Was seen here in 10/2014 for knee issues, had her first ever steroid shot at that time.  Has been having right knee issues in general.  However, Wednesday evening she was walking briskly when someone called her name.  She turned abruptly and had immediate right knee pain. She denies pop, but as the night went on and over the last day or so til now has continued to have right knee pain, some swelling, pain with motion.  Is able to bear weight but limping.   No numbness, no tingling or weakness . No fall.  No other aggravating or relieving factors. No other complaint.   Objective: BP 100/70 mmHg  Pulse 80  Resp 15  Wt 272 lb (123.378 kg)  Gen: wd, wn, obese white female Right knee tender anterior medial and lateral joint line, pain with all ROM which is limited due to pain and mild swelling generally, unable to fully assess the knee today given pain and swelling.  No obvious ACL/PCL laxity, unable to fully test mensicus, no obvious MCL and LCL tenderness or laxity.  Rest of leg nontender, hip and ankle normal ROM without tenderness.   Legs neurovascularly intact Skin: no obvious ecchymosis or erythema   Assessment: Encounter Diagnoses  Name Primary?  . Knee pain, right Yes  . Meniscus degeneration, right   . Knee injury, right, initial encounter     Plan: Reviewed the 10/23/14 MRI right knee showing partial meniscal tears and degenerative tissue of meniscus.   discussed her acute injury and pain.  Advised for the next several days use leg elevation, ice, stay off the leg, use crutches (script given). Begin Naprosyn, Hydrocodone prn for worse pain.  Discussed that pain and ROM could take 1-2 wk to improve, but if not seeing much improvement in 7-10 days, of if unable to bear weight within 4-5 days, then call back.  Plan a 2-3 week recheck in general if gradually improving.

## 2015-01-16 ENCOUNTER — Telehealth: Payer: Self-pay | Admitting: Medical

## 2015-01-16 NOTE — Telephone Encounter (Signed)
Called to check on her.  Knee is improved with less swelling and pain, but still guarded.  Able to walk without severe pain currently.  She will recheck in 2wk if not improving.

## 2015-01-22 ENCOUNTER — Telehealth: Payer: Self-pay | Admitting: Family Medicine

## 2015-01-22 DIAGNOSIS — F901 Attention-deficit hyperactivity disorder, predominantly hyperactive type: Secondary | ICD-10-CM

## 2015-01-22 MED ORDER — AMPHETAMINE-DEXTROAMPHET ER 25 MG PO CP24: 25.0000 mg | ORAL_CAPSULE | Freq: Two times a day (BID) | ORAL | Status: DC

## 2015-01-22 NOTE — Telephone Encounter (Signed)
Called [facility], left message to set up appt. For patient.  Waiting for them to call me back.

## 2015-01-22 NOTE — Telephone Encounter (Signed)
Refer to [facility] for evaluation of knee pain and let her know that her prescription is here

## 2015-01-22 NOTE — Telephone Encounter (Signed)
Requesting a refill on Adderall. Call [phone] when script ready for pick up. Also pt's knee is not any better and she states that Dr Redmond School mentioned that he would refer her to a specialist  If it did not improve.

## 2015-01-23 NOTE — Telephone Encounter (Signed)
Pt has appt. at [facility], Dr. Joni Fears, Feb 05, 2015 at 2:00pm. Pt was called made aware of app. and to bring Ins. card and photo ID.

## 2015-02-07 ENCOUNTER — Ambulatory Visit (INDEPENDENT_AMBULATORY_CARE_PROVIDER_SITE_OTHER): Payer: 59 | Admitting: Family Medicine

## 2015-02-07 ENCOUNTER — Encounter: Payer: Self-pay | Admitting: Family Medicine

## 2015-02-07 VITALS — BP 130/74 | HR 88 | Temp 98.2°F | Ht 69.0 in | Wt 268.2 lb

## 2015-02-07 DIAGNOSIS — J069 Acute upper respiratory infection, unspecified: Secondary | ICD-10-CM | POA: Diagnosis not present

## 2015-02-07 NOTE — Progress Notes (Signed)
Chief Complaint  Patient presents with  . Sore Throat    along with congestion that started 2 days ago. Not too sore, states maybe from PND.    2 days ago she started with sore throat, congestion. It seems a little better today than it was yesterday. Denies fevers (felt chilled last night), no sinus pain.  Nasal mucus is yellow.  Just a slight cough, mostly related to PND.  No shortness of breath or wheezing. +sneezing.  She has h/o allergies, but they haven't been bad this year.  +sick contacts include son who has had URI and possible pneumonia.  Prior to son being ill, her husband had the flu and pneumonia (2 weeks ago).  She has been taking Dayquil with some improvement.  PMH, PSH, SH reviewed  Outpatient Encounter Prescriptions as of 02/07/2015  Medication Sig  . amphetamine-dextroamphetamine (ADDERALL XR) 25 MG 24 hr capsule Take 1 capsule by mouth 2 (two) times daily.  Derrill Memo ON 02/22/2015] amphetamine-dextroamphetamine (ADDERALL XR) 25 MG 24 hr capsule Take 1 capsule by mouth 2 (two) times daily.  Derrill Memo ON 03/24/2015] amphetamine-dextroamphetamine (ADDERALL XR) 25 MG 24 hr capsule Take 1 capsule by mouth 2 (two) times daily.  Marland Kitchen buPROPion (WELLBUTRIN SR) 150 MG 12 hr tablet TAKE 1 TABLET (150 MG TOTAL) BY MOUTH 2 (TWO) TIMES DAILY.  . [DISCONTINUED] Prenat-w/oA-Fe Bisgly-FA-Omega (NESTABS DHA) 32-1 MG MISC daily.  Marland Kitchen acetaminophen (TYLENOL) 500 MG tablet Take 1,000 mg by mouth every 8 (eight) hours as needed for pain (For headache.).  . HYDROcodone-acetaminophen (NORCO) 7.5-325 MG per tablet Take 1 tablet by mouth every 6 (six) hours as needed for moderate pain. (Patient not taking: Reported on 02/07/2015)  . naproxen (NAPROSYN) 500 MG tablet Take 1 tablet (500 mg total) by mouth 2 (two) times daily with a meal. (Patient not taking: Reported on 02/07/2015)  . [DISCONTINUED] albuterol (PROVENTIL HFA;VENTOLIN HFA) 108 (90 BASE) MCG/ACT inhaler Inhale 2 puffs into the lungs every 6 (six) hours  as needed for wheezing or shortness of breath. (Patient not taking: Reported on 01/12/2015)   Facility-Administered Encounter Medications as of 02/07/2015  Medication  . lidocaine (PF) (XYLOCAINE) 1 % injection 2 mL  . triamcinolone acetonide (KENALOG-40) injection 40 mg   Not using naproxen or vicodin currently.  No Known Allergies  ROS: no fevers, chills, headaches, dizziness, shortness of breath, nausea, vomiting, bleeding, bruising, rash, urinary complaints, chest pain or other complaints.  Knee pain--Having knee surgery next month.  See HPI  PHYSICAL EXAM: BP 130/74 mmHg  Pulse 88  Temp(Src) 98.2 F (36.8 C) (Tympanic)  Ht 5' 9"  (1.753 m)  Wt 268 lb 3.2 oz (121.655 kg)  BMI 39.59 kg/m2  LMP 02/07/2015  Breastfeeding? No Well developed, pleasant female in no distress.  Occasional sniffle, cough HEENT: PERRL, EOMi, conjunctiva clear. Nasal mucosa is only mildly edematous L>R. No erythema or purulence.  OP is erythematous on anterior tonsillar pillars bilaterally. Tonsils are normal. Normal mucus membranes Neck: no lymphadenopathy or mass Heart: regular rate and rhythm without murmur Lungs: clear Skin: no rash Neuro: alert and oriented. Cranial nerves intact, normal strength, gait Psych: normal mood, affect  ASSESSMENT/PLAN:  Acute upper respiratory infection   Drink plenty of fluids You may continue decongestants as needed (ie Dayquil) Consider adding guaifenesin (mucinex or robitussin) to help thin out any thick mucus or phlegm.   Return if symptoms persist or worsen after 7-10 days, or any new symptoms, shortness of breath or other concerns.

## 2015-02-07 NOTE — Patient Instructions (Signed)
  Drink plenty of fluids You may continue decongestants as needed (ie Dayquil) Consider adding guaifenesin (mucinex or robitussin) to help thin out any thick mucus or phlegm.   Return if symptoms persist or worsen after 7-10 days, or any new symptoms, shortness of breath or other concerns.

## 2015-02-16 ENCOUNTER — Telehealth: Payer: Self-pay | Admitting: Family Medicine

## 2015-02-16 MED ORDER — AMOXICILLIN 875 MG PO TABS: 875.0000 mg | ORAL_TABLET | Freq: Two times a day (BID) | ORAL | Status: DC

## 2015-02-16 NOTE — Telephone Encounter (Signed)
Pt. Called in stating that she is not feeling any better. Pt. Is a Dr. Redmond School pt. But seen Dr. Tomi Bamberger last week for an acute upper resp. Infection. Dr. Tomi Bamberger did not prescribe her anything for this. Pt. Is now wanting to know can a prescription be called in today for the infection.?

## 2015-02-16 NOTE — Telephone Encounter (Signed)
Dr.Lalonde is this okay

## 2015-02-16 NOTE — Telephone Encounter (Signed)
Her symptoms got much worse. I will call in an anti-biotic.

## 2015-04-17 ENCOUNTER — Telehealth: Payer: Self-pay | Admitting: Family Medicine

## 2015-04-17 DIAGNOSIS — F901 Attention-deficit hyperactivity disorder, predominantly hyperactive type: Secondary | ICD-10-CM

## 2015-04-17 MED ORDER — AMPHETAMINE-DEXTROAMPHET ER 25 MG PO CP24: 25.0000 mg | ORAL_CAPSULE | Freq: Two times a day (BID) | ORAL | Status: DC

## 2015-04-17 NOTE — Telephone Encounter (Signed)
Left word for word message for pt

## 2015-04-17 NOTE — Telephone Encounter (Signed)
Pt called for refills of adderall. Please call [phone] when ready.

## 2015-04-17 NOTE — Telephone Encounter (Signed)
Have her set up an appointment for follow-up on ADD in the next several months.

## 2015-07-23 ENCOUNTER — Telehealth: Payer: Self-pay | Admitting: Family Medicine

## 2015-07-23 DIAGNOSIS — F901 Attention-deficit hyperactivity disorder, predominantly hyperactive type: Secondary | ICD-10-CM

## 2015-07-23 NOTE — Telephone Encounter (Signed)
Left word for word message on pt machine

## 2015-07-23 NOTE — Telephone Encounter (Signed)
Pt called for refills of Adderall. Please call [phone] when ready.

## 2015-07-23 NOTE — Telephone Encounter (Signed)
Let her know that it is time for a med check appointment concerning her ADD

## 2015-07-24 MED ORDER — AMPHETAMINE-DEXTROAMPHET ER 25 MG PO CP24: 25.0000 mg | ORAL_CAPSULE | Freq: Two times a day (BID) | ORAL | Status: DC

## 2015-07-24 NOTE — Telephone Encounter (Signed)
Pt made a med-check appt. Needs meds filled until then. Pt can be reached at [phone]

## 2015-07-25 ENCOUNTER — Telehealth: Payer: Self-pay | Admitting: Family Medicine

## 2015-07-25 NOTE — Telephone Encounter (Signed)
Pt states she needs P.A. Amphetamine salts ER 67m Called [facility] t# [phone] P.A. Was approved til 07/25/18 Left message for pt

## 2015-08-01 ENCOUNTER — Ambulatory Visit (INDEPENDENT_AMBULATORY_CARE_PROVIDER_SITE_OTHER): Payer: 59 | Admitting: Family Medicine

## 2015-08-01 ENCOUNTER — Encounter: Payer: Self-pay | Admitting: Family Medicine

## 2015-08-01 VITALS — BP 110/70 | HR 90 | Ht 69.0 in | Wt 272.0 lb

## 2015-08-01 DIAGNOSIS — F900 Attention-deficit hyperactivity disorder, predominantly inattentive type: Secondary | ICD-10-CM

## 2015-08-01 DIAGNOSIS — F901 Attention-deficit hyperactivity disorder, predominantly hyperactive type: Secondary | ICD-10-CM | POA: Diagnosis not present

## 2015-08-01 MED ORDER — AMPHETAMINE-DEXTROAMPHET ER 25 MG PO CP24
25.0000 mg | ORAL_CAPSULE | Freq: Two times a day (BID) | ORAL | Status: DC
Start: 2015-08-23 — End: 2015-11-22

## 2015-08-01 MED ORDER — AMPHETAMINE-DEXTROAMPHET ER 25 MG PO CP24: 25.0000 mg | ORAL_CAPSULE | Freq: Two times a day (BID) | ORAL | Status: DC

## 2015-08-01 NOTE — Progress Notes (Signed)
   Subjective:    Patient ID: Mia Parker, female    DOB: [birth year], 44 y.o.   MRN: [id number]  HPI She is here for an interval evaluation. She continues to do quite nicely on her Adderall. It lasts roughly 6-8 hours. She takes this twice per day. When she comes off at the end of the day she has had no issues other than becoming more easily distracted.   Review of Systems     Objective:   Physical Exam Alert and in no distress otherwise not examined       Assessment & Plan:  Attention deficit hyperactivity disorder (ADHD), predominantly inattentive type  Attention-deficit hyperactivity disorder, predominantly hyperactive type - Plan: amphetamine-dextroamphetamine (ADDERALL XR) 25 MG 24 hr capsule  I also encouraged her to come back for complete examination.

## 2015-09-14 ENCOUNTER — Encounter: Payer: Self-pay | Admitting: Family Medicine

## 2015-09-14 ENCOUNTER — Ambulatory Visit (INDEPENDENT_AMBULATORY_CARE_PROVIDER_SITE_OTHER): Payer: 59 | Admitting: Family Medicine

## 2015-09-14 VITALS — BP 110/70 | HR 76 | Resp 14 | Ht 68.0 in | Wt 265.4 lb

## 2015-09-14 DIAGNOSIS — Z87891 Personal history of nicotine dependence: Secondary | ICD-10-CM

## 2015-09-14 DIAGNOSIS — F900 Attention-deficit hyperactivity disorder, predominantly inattentive type: Secondary | ICD-10-CM | POA: Diagnosis not present

## 2015-09-14 DIAGNOSIS — Z0000 Encounter for general adult medical examination without abnormal findings: Secondary | ICD-10-CM

## 2015-09-14 LAB — COMPREHENSIVE METABOLIC PANEL
ALK PHOS: 87 U/L (ref 33–115)
ALT: 20 U/L (ref 6–29)
AST: 22 U/L (ref 10–30)
Albumin: 3.8 g/dL (ref 3.6–5.1)
BUN: 11 mg/dL (ref 7–25)
CALCIUM: 8.9 mg/dL (ref 8.6–10.2)
CHLORIDE: 102 mmol/L (ref 98–110)
CO2: 28 mmol/L (ref 20–31)
CREATININE: 0.69 mg/dL (ref 0.50–1.10)
GLUCOSE: 96 mg/dL (ref 65–99)
POTASSIUM: 4.4 mmol/L (ref 3.5–5.3)
Sodium: 136 mmol/L (ref 135–146)
Total Bilirubin: 0.7 mg/dL (ref 0.2–1.2)
Total Protein: 6.9 g/dL (ref 6.1–8.1)

## 2015-09-14 LAB — CBC WITH DIFFERENTIAL/PLATELET
BASOS ABS: 0.2 10*3/uL — AB (ref 0.0–0.1)
Basophils Relative: 2 % — ABNORMAL HIGH (ref 0–1)
EOS ABS: 2.9 10*3/uL — AB (ref 0.0–0.7)
Eosinophils Relative: 26 % — ABNORMAL HIGH (ref 0–5)
HCT: 41.6 % (ref 36.0–46.0)
Hemoglobin: 14.3 g/dL (ref 12.0–15.0)
LYMPHS ABS: 2.4 10*3/uL (ref 0.7–4.0)
Lymphocytes Relative: 22 % (ref 12–46)
MCH: 29.2 pg (ref 26.0–34.0)
MCHC: 34.4 g/dL (ref 30.0–36.0)
MCV: 84.9 fL (ref 78.0–100.0)
MONO ABS: 0.8 10*3/uL (ref 0.1–1.0)
MPV: 9 fL (ref 8.6–12.4)
Monocytes Relative: 7 % (ref 3–12)
Neutro Abs: 4.7 10*3/uL (ref 1.7–7.7)
Neutrophils Relative %: 43 % (ref 43–77)
Platelets: 368 10*3/uL (ref 150–400)
RBC: 4.9 MIL/uL (ref 3.87–5.11)
RDW: 14.1 % (ref 11.5–15.5)
WBC: 11 10*3/uL — ABNORMAL HIGH (ref 4.0–10.5)

## 2015-09-14 LAB — LIPID PANEL
Cholesterol: 201 mg/dL — ABNORMAL HIGH (ref 125–200)
HDL: 64 mg/dL (ref >=46)
LDL Cholesterol: 117 mg/dL (ref <130)
TRIGLYCERIDES: 101 mg/dL (ref <150)
Total CHOL/HDL Ratio: 3.1 Ratio (ref <=5.0)
VLDL: 20 mg/dL (ref <30)

## 2015-09-14 NOTE — Patient Instructions (Signed)
150 minutes a week of something physical You need to have some "me time" also "date nights".

## 2015-09-14 NOTE — Progress Notes (Signed)
   Subjective:    Patient ID: Mia Parker, female    DOB: [birth year], 44 y.o.   MRN: [id number]  HPI She is here for a complete examination. She does have underlying ADD and uses her medication usually twice per day. She gets 6-8 hours of benefit out of each medication. Had helps her focus. She has no major side effects from this. She has had knee surgery and is doing well postoperatively from this. She gets regular gynecologic evaluation. She has not had a mammogram. Her last Pap was 1 year ago. She has had no difficulty with allergies, chest pain, shortness of breath, GI issues. Family and social history as well as health maintenance and immunizations were reviewed. He does not exercise regularly and admits to poor diet. She does have a young son at home keeps her busy.   Review of Systems  All other systems reviewed and are negative.      Objective:   Physical Exam BP 110/70 mmHg  Pulse 76  Resp 14  Ht 5' 8"  (1.727 m)  Wt 265 lb 6.4 oz (120.385 kg)  BMI 40.36 kg/m2  General Appearance:    Alert, cooperative, no distress, appears stated age  Head:    Normocephalic, without obvious abnormality, atraumatic  Eyes:    PERRL, conjunctiva/corneas clear, EOM's intact, fundi    benign  Ears:    Normal TM's and external ear canals  Nose:   Nares normal, mucosa normal, no drainage or sinus   tenderness  Throat:   Lips, mucosa, and tongue normal; teeth and gums normal  Neck:   Supple, no lymphadenopathy;  thyroid:  no   enlargement/tenderness/nodules; no carotid   bruit or JVD  Back:    Spine nontender, no curvature, ROM normal, no CVA     tenderness  Lungs:     Clear to auscultation bilaterally without wheezes, rales or     ronchi; respirations unlabored  Chest Wall:    No tenderness or deformity   Heart:    Regular rate and rhythm, S1 and S2 normal, no murmur, rub   or gallop  Breast Exam:    Deferred to GYN  Abdomen:     Soft, non-tender, nondistended, normoactive bowel sounds,   no masses, no hepatosplenomegaly  Genitalia:    Deferred to GYN     Extremities:   No clubbing, cyanosis or edema  Pulses:   2+ and symmetric all extremities  Skin:   Skin color, texture, turgor normal, no rashes or lesions  Lymph nodes:   Cervical, supraclavicular, and axillary nodes normal  Neurologic:   CNII-XII intact, normal strength, sensation and gait; reflexes 2+ and symmetric throughout          Psych:   Normal mood, affect, hygiene and grooming.          Assessment & Plan:  Routine general medical examination at a health care facility - Plan: CBC with Differential/Platelet, Comprehensive metabolic panel, Lipid panel  Attention deficit hyperactivity disorder (ADHD), predominantly inattentive type  Morbid obesity due to excess calories [facility])  Former smoker She continues to do well on her ADD medication. She has lost some weight over the last several years. I spent the portion of the encounter discussing diet, exercise, in general taking better care of herself and making room for herself in her life. She will set up to get a mammogram. Discussed exercise, 150 minutes a week of something physical as well as making dietary changes in regard to carbohydrates.

## 2015-09-27 ENCOUNTER — Telehealth: Payer: Self-pay

## 2015-09-27 DIAGNOSIS — D72829 Elevated white blood cell count, unspecified: Secondary | ICD-10-CM

## 2015-09-27 NOTE — Telephone Encounter (Signed)
Referral in workque. LM with [facility] to call me back to schedule her appt. Will call them back if I don't get a call back

## 2015-09-28 ENCOUNTER — Telehealth: Payer: Self-pay | Admitting: Hematology and Oncology

## 2015-09-28 NOTE — Telephone Encounter (Signed)
Spoke with someone to get her scheduled with [facility] and they told me they would call her and schedule her then give me a call back letting me know.

## 2015-09-28 NOTE — Telephone Encounter (Signed)
Pt aware of np appt 10/08/15@2 :00

## 2015-10-02 ENCOUNTER — Telehealth: Payer: Self-pay | Admitting: Hematology and Oncology

## 2015-10-02 NOTE — Telephone Encounter (Signed)
Pt called and needed to reschedule due to having court that day.  Rescheduled to 1/26; pt confirmed

## 2015-10-08 ENCOUNTER — Ambulatory Visit: Payer: Self-pay | Admitting: Hematology and Oncology

## 2015-10-11 ENCOUNTER — Ambulatory Visit (HOSPITAL_BASED_OUTPATIENT_CLINIC_OR_DEPARTMENT_OTHER): Payer: 59 | Admitting: Hematology and Oncology

## 2015-10-11 ENCOUNTER — Encounter: Payer: Self-pay | Admitting: Hematology and Oncology

## 2015-10-11 VITALS — BP 131/78 | HR 82 | Temp 98.2°F | Resp 19 | Wt 273.1 lb

## 2015-10-11 DIAGNOSIS — D721 Eosinophilia, unspecified: Secondary | ICD-10-CM | POA: Insufficient documentation

## 2015-10-11 NOTE — Progress Notes (Signed)
[facility] CONSULT NOTE  Patient Care Team: Denita Lung, MD as PCP - General (Family Medicine)  CHIEF COMPLAINTS/PURPOSE OF CONSULTATION:   chronic leukocytosis with relative eosinophilia  HISTORY OF PRESENTING ILLNESS:  Mia Parker 45 y.o. female is here because of elevated WBC.  She was found to have abnormal CBC from  Routine blood work. I should with the patient her prior blood count dated back from September 2014 to present. Her total white blood cell count has trended down,  From the highest level at 18.9 down to 11 with mild relative eosinophilia 15-26% She denies recent infection. The last prescription antibiotics was more than 3 months ago There is not reported symptoms of sinus congestion, cough, urinary frequency/urgency or dysuria, diarrhea, joint swelling/pain or  Sinus pain. She does have history of chronic allergies and has seen an allergist in the past with skin testing. She was tested positive /allergic to cat tender, down feathers, blood walnut and other pollens She has frequent, recurrent sinusitis in the spring and fall seasons of which at the time she would take over-the-counter remedies. She work at [employer] with dusty environment. At her house, she denies mold or dirty carpets  She does not have pets She had no prior history or diagnosis of cancer. Her age appropriate screening programs are up-to-date. The patient has no prior diagnosis of autoimmune disease and was not prescribed corticosteroids related products.  she has occasional skin rashes and she has chronic dry skin.  MEDICAL HISTORY:  Past Medical History  Diagnosis Date  . Infertility, female   . Obesity   . ADD (attention deficit disorder)   . AMA (advanced maternal age) multigravida 12+   . Postpartum edema 06/10/2013    SURGICAL HISTORY: Past Surgical History  Procedure Laterality Date  . Le[place]    . Cesarean section N/A 06/07/2013    Procedure: Primary Cesarean Section  Delivery Baby  Boy @ 0762, Apgars   ;  Surgeon: Lovenia Kim, MD;  Location: [facility];  Service: Obstetrics;  Laterality: N/A;    SOCIAL HISTORY: Social History   Social History  . Marital Status: Single    Spouse Name: N/A  . Number of Children: N/A  . Years of Education: N/A   Occupational History  . Not on file.   Social History Main Topics  . Smoking status: Former Smoker -- 0.50 packs/day    Quit date: 09/29/2012  . Smokeless tobacco: Never Used     Comment: quit in august of 2013  . Alcohol Use: No  . Drug Use: No  . 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   Other Topics Concern  . Not on file   Social History Narrative    FAMILY HISTORY: Family History  Problem Relation Age of Onset  . Hypertension Mother   . Diabetes Father   . Hypertension Father   . Heart attack Father     ALLERGIES:  has No Known Allergies.  MEDICATIONS:  Current Outpatient Prescriptions  Medication Sig Dispense Refill  . amphetamine-dextroamphetamine (ADDERALL XR) 25 MG 24 hr capsule Take 1 capsule by mouth 2 (two) times daily. 60 capsule 0   Current Facility-Administered Medications  Medication Dose Route Frequency Provider Last Rate Last Dose  . lidocaine (PF) (XYLOCAINE) 1 % injection 2 mL  2 mL Intradermal Once Denita Lung, MD  REVIEW OF SYSTEMS:   Constitutional: Denies fevers, chills or abnormal night sweats Eyes: Denies blurriness of vision, double vision or watery eyes Ears, nose, mouth, throat, and face: Denies mucositis or sore throat Respiratory: Denies cough, dyspnea or wheezes Cardiovascular: Denies palpitation, chest discomfort or lower extremity swelling Gastrointestinal:  Denies nausea, heartburn or change in bowel habits Lymphatics: Denies new lymphadenopathy or easy bruising Neurological:Denies numbness, tingling or new weaknesses Behavioral/Psych: Mood is stable, no new  changes  All other systems were reviewed with the patient and are negative.  PHYSICAL EXAMINATION: ECOG PERFORMANCE STATUS: 0 - Asymptomatic  Filed Vitals:   10/11/15 1000  BP: 131/78  Pulse: 82  Temp: 98.2 F (36.8 C)  Resp: 19   Filed Weights   10/11/15 1000  Weight: 273 lb 1.6 oz (123.877 kg)    GENERAL:alert, no distress and comfortable. She is morbidly obese SKIN:  She has dry skin with skin peeling over the right palm. She had mild macular rash over her chest wall area. EYES: normal, conjunctiva are pink and non-injected, sclera clear OROPHARYNX:no exudate, no erythema and lips, buccal mucosa, and tongue normal  NECK: supple, thyroid normal size, non-tender, without nodularity LYMPH:  no palpable lymphadenopathy in the cervical, axillary or inguinal LUNGS: clear to auscultation and percussion with normal breathing effort HEART: regular rate & rhythm and no murmurs and no lower extremity edema ABDOMEN:abdomen soft, non-tender and normal bowel sounds Musculoskeletal:no cyanosis of digits and no clubbing  PSYCH: alert & oriented x 3 with fluent speech NEURO: no focal motor/sensory deficits  LABORATORY DATA:  I have reviewed the data as listed Recent Results (from the past 2160 hour(s))  CBC with Differential/Platelet     Status: Abnormal   Collection Time: 09/14/15 12:01 AM  Result Value Ref Range   WBC 11.0 (H) 4.0 - 10.5 K/uL   RBC 4.90 3.87 - 5.11 MIL/uL   Hemoglobin 14.3 12.0 - 15.0 g/dL   HCT 41.6 36.0 - 46.0 %   MCV 84.9 78.0 - 100.0 fL   MCH 29.2 26.0 - 34.0 pg   MCHC 34.4 30.0 - 36.0 g/dL   RDW 14.1 11.5 - 15.5 %   Platelets 368 150 - 400 K/uL   MPV 9.0 8.6 - 12.4 fL   Neutrophils Relative % 43 43 - 77 %   Neutro Abs 4.7 1.7 - 7.7 K/uL   Lymphocytes Relative 22 12 - 46 %   Lymphs Abs 2.4 0.7 - 4.0 K/uL   Monocytes Relative 7 3 - 12 %   Monocytes Absolute 0.8 0.1 - 1.0 K/uL   Eosinophils Relative 26 (H) 0 - 5 %   Eosinophils Absolute 2.9 (H) 0.0 - 0.7  K/uL   Basophils Relative 2 (H) 0 - 1 %   Basophils Absolute 0.2 (H) 0.0 - 0.1 K/uL   Smear Review Criteria for review not met   Comprehensive metabolic panel     Status: None   Collection Time: 09/14/15 12:01 AM  Result Value Ref Range   Sodium 136 135 - 146 mmol/L   Potassium 4.4 3.5 - 5.3 mmol/L   Chloride 102 98 - 110 mmol/L   CO2 28 20 - 31 mmol/L   Glucose, Bld 96 65 - 99 mg/dL   BUN 11 7 - 25 mg/dL   Creat 0.69 0.50 - 1.10 mg/dL   Total Bilirubin 0.7 0.2 - 1.2 mg/dL   Alkaline Phosphatase 87 33 - 115 U/L   AST 22 10 - 30 U/L  ALT 20 6 - 29 U/L   Total Protein 6.9 6.1 - 8.1 g/dL   Albumin 3.8 3.6 - 5.1 g/dL   Calcium 8.9 8.6 - 10.2 mg/dL  Lipid panel     Status: Abnormal   Collection Time: 09/14/15 12:01 AM  Result Value Ref Range   Cholesterol 201 (H) 125 - 200 mg/dL   Triglycerides 101 <150 mg/dL   HDL 64 >=46 mg/dL   Total CHOL/HDL Ratio 3.1 <=5.0 Ratio   VLDL 20 <30 mg/dL   LDL Cholesterol 117 <130 mg/dL    Comment:   Total Cholesterol/HDL Ratio:CHD Risk                        Coronary Heart Disease Risk Table                                        Men       Women          1/2 Average Risk              3.4        3.3              Average Risk              5.0        4.4           2X Average Risk              9.6        7.1           3X Average Risk             23.4       11.0 Use the calculated Patient Ratio above and the CHD Risk table  to determine the patient's CHD Risk.     ASSESSMENT & PLAN Allergic eosinophilia  History and physical examination are highly suggestive of allergic eosinophilia. She had seen an allergist in the past and tested positive and appears to be allergic to cat dander, down feathers, black walnut and few other environmental pollen allergies. She said that her work environment is also quite dusty. In any case, the patient is completely asymptomatic apart from frequent allergic rhinitis symptoms during the fall and spring time of  which she would take over-the-counter remedies for allergies. She has some mild skin rash and occasional skin itching on examination today. There is nothing to suggest organ dysfunction or anything to suggest myeloproliferative disorder. I do not recommend further testing for now. She has frequent blood count monitoring with her primary care physician, at least during her annual physical examination If the absolute eosinophilia continues to get worse or her total white blood cell count is greater than 20,000 or more , I will be happy to see her back. The patient is satisfied with the plan and she agreed with no future follow-up for now.   I spent 30 minutes on counseling, total clinic time spent 40 minutes

## 2015-10-11 NOTE — Assessment & Plan Note (Signed)
History and physical examination are highly suggestive of allergic eosinophilia. She had seen an allergist in the past and tested positive and appears to be allergic to cat dander, down feathers, black walnut and few other environmental pollen allergies. She said that her work environment is also quite dusty. In any case, the patient is completely asymptomatic apart from frequent allergic rhinitis symptoms during the fall and spring time of which she would take over-the-counter remedies for allergies. She has some mild skin rash and occasional skin itching on examination today. There is nothing to suggest organ dysfunction or anything to suggest myeloproliferative disorder. I do not recommend further testing for now. She has frequent blood count monitoring with her primary care physician, at least during her annual physical examination If the absolute eosinophilia continues to get worse or her total white blood cell count is greater than 20,000 or more , I will be happy to see her back. The patient is satisfied with the plan and she agreed with no future follow-up for now.

## 2015-11-08 LAB — OB RESULTS CONSOLE HGB/HCT, BLOOD
HCT: 41 %
Hemoglobin: 13.5 g/dL

## 2015-11-08 LAB — OB RESULTS CONSOLE HEPATITIS B SURFACE ANTIGEN: Hepatitis B Surface Ag: NEGATIVE

## 2015-11-08 LAB — OB RESULTS CONSOLE RPR: RPR: NONREACTIVE

## 2015-11-08 LAB — OB RESULTS CONSOLE HIV ANTIBODY (ROUTINE TESTING): HIV: NONREACTIVE

## 2015-11-21 LAB — OB RESULTS CONSOLE GC/CHLAMYDIA
CHLAMYDIA, DNA PROBE: NEGATIVE
GC PROBE AMP, GENITAL: NEGATIVE

## 2015-11-22 ENCOUNTER — Telehealth: Payer: Self-pay | Admitting: Family Medicine

## 2015-11-22 MED ORDER — AMPHETAMINE-DEXTROAMPHET ER 25 MG PO CP24: 25.0000 mg | ORAL_CAPSULE | Freq: Two times a day (BID) | ORAL | Status: DC

## 2015-11-22 NOTE — Telephone Encounter (Signed)
Pt called requesting a refill on her adderall pt can be reached at [phone] (M) when ready to be picked up

## 2015-11-22 NOTE — Telephone Encounter (Signed)
Pt called and informed that rx is ready.

## 2015-11-28 ENCOUNTER — Telehealth: Payer: Self-pay | Admitting: Family Medicine

## 2015-11-28 DIAGNOSIS — G4730 Sleep apnea, unspecified: Secondary | ICD-10-CM

## 2015-11-28 NOTE — Telephone Encounter (Signed)
Pt called & states needs referral for sleep study per her insurance.  Husband has witnessed her stopping breathing while sleeping.  Wants referral to          Dr Merlene Laughter at [facility]. [place] [phone]

## 2015-11-29 NOTE — Telephone Encounter (Signed)
Check with her insurance and see if we can do it as an outpatient otherwise go ahead and set this up

## 2015-11-30 NOTE — Telephone Encounter (Signed)
Refer to [facility] for a referral

## 2015-11-30 NOTE — Telephone Encounter (Signed)
Was not done, will have to be handle by Dr. Redmond School cma next week

## 2015-11-30 NOTE — Telephone Encounter (Signed)
[name], please handle this

## 2015-12-03 NOTE — Telephone Encounter (Signed)
Called the number provided and that was hospital services and couldn't set up a sleep study. Spoke to patient and will refer to sleep study at hospital

## 2016-01-17 ENCOUNTER — Other Ambulatory Visit: Payer: Self-pay

## 2016-01-17 DIAGNOSIS — G4730 Sleep apnea, unspecified: Secondary | ICD-10-CM

## 2016-02-04 ENCOUNTER — Ambulatory Visit (HOSPITAL_BASED_OUTPATIENT_CLINIC_OR_DEPARTMENT_OTHER): Payer: 59 | Attending: Family Medicine | Admitting: Internal Medicine

## 2016-02-04 VITALS — Ht 69.0 in | Wt 298.0 lb

## 2016-02-04 DIAGNOSIS — G4736 Sleep related hypoventilation in conditions classified elsewhere: Secondary | ICD-10-CM | POA: Insufficient documentation

## 2016-02-04 DIAGNOSIS — G4730 Sleep apnea, unspecified: Secondary | ICD-10-CM

## 2016-02-04 DIAGNOSIS — R0683 Snoring: Secondary | ICD-10-CM | POA: Diagnosis not present

## 2016-02-04 DIAGNOSIS — G4719 Other hypersomnia: Secondary | ICD-10-CM | POA: Insufficient documentation

## 2016-02-04 DIAGNOSIS — G4733 Obstructive sleep apnea (adult) (pediatric): Secondary | ICD-10-CM | POA: Diagnosis not present

## 2016-02-07 ENCOUNTER — Encounter (HOSPITAL_BASED_OUTPATIENT_CLINIC_OR_DEPARTMENT_OTHER): Payer: 59

## 2016-02-09 DIAGNOSIS — R0683 Snoring: Secondary | ICD-10-CM

## 2016-02-09 DIAGNOSIS — G4733 Obstructive sleep apnea (adult) (pediatric): Secondary | ICD-10-CM

## 2016-02-09 DIAGNOSIS — G4719 Other hypersomnia: Secondary | ICD-10-CM

## 2016-02-09 DIAGNOSIS — G478 Other sleep disorders: Secondary | ICD-10-CM | POA: Diagnosis not present

## 2016-02-09 NOTE — Procedures (Signed)
    Patient Name: Mia Parker, Mia Parker Date: 02/04/2016 Gender: Female D.O.B: September 30, 1970 Age (years): 63 Referring Provider: Denita Lung Height (inches): 35 Interpreting Physician: Baird Lyons MD, ABSM Weight (lbs): 298 RPSGT: Jacolyn Reedy BMI: 36 MRN: [id number] Neck Size: 15.00 CLINICAL INFORMATION Sleep Study Type: unattended HST   Indication for sleep study: obstructive sleep apnea   Epworth Sleepiness Score: 14 SLEEP STUDY TECHNIQUE A multi-channel overnight portable sleep study was performed. The channels recorded were: nasal airflow, thoracic respiratory movement, and oxygen saturation with a pulse oximetry. Snoring was also monitored.  MEDICATIONS Patient self administered medications include: none reported during sleep study.  SLEEP ARCHITECTURE Patient was studied for 404.9 minutes. The sleep efficiency was 98.8 % and the patient was supine for 84.4%. The arousal index was 0.1 per hour.  RESPIRATORY PARAMETERS The overall AHI was 86.2 per hour, with a central apnea index of 0.0 per hour. The oxygen nadir was 79% during sleep.  CARDIAC DATA Mean heart rate during sleep was 78.6 bpm.  IMPRESSIONS - Severe obstructive sleep apnea occurred during this study (AHI = 86.2/h). - No significant central sleep apnea occurred during this study (CAI = 0.0/h). - Severe oxygen desaturation was noted during this study (Min O2 = 79%). - Patient snored   DIAGNOSIS - Obstructive Sleep Apnea (327.23 [G47.33 ICD-10]) - Nocturnal Hypoxemia (327.26 [G47.36 ICD-10])  RECOMMENDATIONS - CPAP titration is usually the preferred initial treatment with scores in this range. - Avoid alcohol, sedatives and other CNS depressants that may worsen sleep apnea and disrupt normal sleep architecture. - Sleep hygiene should be reviewed to assess factors that may improve sleep quality. - Weight management and regular exercise should be initiated or continued.    Deneise Lever Diplomate, American Board of Sleep Medicine  ELECTRONICALLY SIGNED ON:  02/09/2016, 8:14 AM [place] PH: [phone]   FX: [phone] [place]

## 2016-02-13 ENCOUNTER — Encounter: Payer: Self-pay | Admitting: Family Medicine

## 2016-02-13 ENCOUNTER — Ambulatory Visit (INDEPENDENT_AMBULATORY_CARE_PROVIDER_SITE_OTHER): Payer: 59 | Admitting: Family Medicine

## 2016-02-13 DIAGNOSIS — Z331 Pregnant state, incidental: Secondary | ICD-10-CM | POA: Diagnosis not present

## 2016-02-13 DIAGNOSIS — G4733 Obstructive sleep apnea (adult) (pediatric): Secondary | ICD-10-CM | POA: Diagnosis not present

## 2016-02-13 DIAGNOSIS — Z3490 Encounter for supervision of normal pregnancy, unspecified, unspecified trimester: Secondary | ICD-10-CM

## 2016-02-13 NOTE — Progress Notes (Signed)
   Subjective:    Patient ID: Mia Parker, female    DOB: 01-26-1971, 45 y.o.   MRN: [id number]  HPI She is here for consult concerning the recent diagnosis of severe OSA. The testing also showed episodes of hypoxia. She also then indicated that she is pregnant and due in September. She does have continued difficulty with nasal congestion however has not been treating this. Her exercise has been quite minimal since the delivery of her other child several years ago. Drinking is not an issue. She is on medications listed in the chart and is doing well on them. She does plan to have a tubal ligation after this.   Review of Systems     Objective:   Physical Exam alert and in no distress otherwise not examined      Assessment & Plan:  Morbid obesity due to excess calories (HCC)  OSA (obstructive sleep apnea) - Plan: For home use only DME continuous positive airway pressure (CPAP)  IUP (intrauterine pregnancy), incidental I discussed OSA in regard to avoiding sedating medications. Did recommend that she use Flonase and Claritin. Also recommended she talk to her gynecologist about the possibility of using Singulair. Discussed diet and exercise with her in detail. This is complicated obviously by her pregnancy and then post pregnancy she will be breast-feeding. I will send her off for a CPAP on auto titrate. Discussed how this is handled with getting her comfortable with this and making sure that she is compliant and it is affected. She will keep me informed concerning this. Over 25 minutes, greater than 50% spent in counseling and coordination of care.

## 2016-02-18 ENCOUNTER — Telehealth: Payer: Self-pay

## 2016-02-18 NOTE — Telephone Encounter (Signed)
Dr Redmond School pt

## 2016-02-18 NOTE — Telephone Encounter (Signed)
I have faxed order to [facility]

## 2016-02-18 NOTE — Telephone Encounter (Signed)
Pt called the office to notify us that she has not heard from [facility] for her CPAP. Victorino December

## 2016-03-17 ENCOUNTER — Encounter: Payer: Self-pay | Admitting: Skilled Nursing Facility1

## 2016-03-17 ENCOUNTER — Encounter: Payer: 59 | Attending: Obstetrics and Gynecology | Admitting: Skilled Nursing Facility1

## 2016-03-17 VITALS — Ht 69.0 in | Wt 306.0 lb

## 2016-03-17 DIAGNOSIS — O99810 Abnormal glucose complicating pregnancy: Secondary | ICD-10-CM | POA: Diagnosis not present

## 2016-03-17 DIAGNOSIS — Z713 Dietary counseling and surveillance: Secondary | ICD-10-CM | POA: Insufficient documentation

## 2016-03-17 DIAGNOSIS — O24410 Gestational diabetes mellitus in pregnancy, diet controlled: Secondary | ICD-10-CM

## 2016-03-17 NOTE — Progress Notes (Signed)
  Patient was seen on 03/17/2016 for Gestational Diabetes self-management class at the [facility]. The following learning objectives were met by the patient during this course:   States the definition of Gestational Diabetes  States why dietary management is important in controlling blood glucose  Describes the effects each nutrient has on blood glucose levels  Demonstrates ability to create a balanced meal plan  Demonstrates carbohydrate counting   States when to check blood glucose levels involving a total of 4 separate occurences in a day  Demonstrates proper blood glucose monitoring techniques  States the effect of stress and exercise on blood glucose levels  States the importance of limiting caffeine and abstaining from alcohol and smoking  Demonstrates the knowledge the glucometer provided in class may not be covered by their insurance and to call their insurance provider immediately after class to know which glucometer their insurance provider does cover as well as calling their physician the next day for a prescription to the glucometer their insurance does cover (if the one provided is not) as well as the lancets and strips for that meter.  Blood glucose monitor given: accucheck guide Lot # [id number] Exp: 04/01/17 Blood glucose reading: 100  Patient instructed to monitor glucose levels: FBS: 60 - <90 1 hour: <140 2 hour: <120  *Patient received handouts:  Nutrition Diabetes and Pregnancy  Carbohydrate Counting List  Patient will be seen for follow-up as needed.

## 2016-04-08 ENCOUNTER — Encounter: Payer: Self-pay | Admitting: Family Medicine

## 2016-04-08 ENCOUNTER — Ambulatory Visit (INDEPENDENT_AMBULATORY_CARE_PROVIDER_SITE_OTHER): Payer: 59 | Admitting: Family Medicine

## 2016-04-08 DIAGNOSIS — G4733 Obstructive sleep apnea (adult) (pediatric): Secondary | ICD-10-CM

## 2016-04-08 DIAGNOSIS — Z331 Pregnant state, incidental: Secondary | ICD-10-CM

## 2016-04-08 DIAGNOSIS — Z3490 Encounter for supervision of normal pregnancy, unspecified, unspecified trimester: Secondary | ICD-10-CM

## 2016-04-08 NOTE — Progress Notes (Signed)
   Subjective:    Patient ID: Mia Parker, female    DOB: May 20, 1971, 45 y.o.   MRN: [id number]  HPI Here for recheck on her CPAP. The compliance report shows 100% compliance with greater than 4 hours use. Her apnea index is 0. She states that she feels amazing more energy and stamina as cognitive functioning. She is due in September. He apparently has gestational diabetes and has been to the nutritionist.   Review of Systems     Objective:   Physical Exam Alert and in no distress.       Assessment & Plan:  Morbid obesity due to excess calories (HCC)  OSA (obstructive sleep apnea)  IUP (intrauterine pregnancy), incidental Continue on the CPAP. Did discuss after her pregnancy and breast-feeding, working on making dietary changes.

## 2016-05-08 ENCOUNTER — Other Ambulatory Visit: Payer: Self-pay | Admitting: Obstetrics and Gynecology

## 2016-05-22 NOTE — H&P (Signed)
Mia Parker is a 45 y.o. female presenting for rpt csection at 43 weeks for new onset PEC w/o severe features. Persistent BPs this week >140/90 at last visit. Urine PC ratio elevated per result last night. Csection rescheduled from 39 weeks due to new onset PEC w/o severe features. OB History    Gravida Para Term Preterm AB Living   3 1 1  0 1 1   SAB TAB Ectopic Multiple Live Births   0 1 0 0 1     Past Medical History:  Diagnosis Date  . ADD (attention deficit disorder)   . AMA (advanced maternal age) multigravida 55+   . Infertility, female   . Obesity   . Postpartum edema 06/10/2013   Past Surgical History:  Procedure Laterality Date  . CESAREAN SECTION N/A 06/07/2013   Procedure: Primary Cesarean Section Delivery Baby  Boy @ 2025, Apgars   ;  Surgeon: Lovenia Kim, MD;  Location: [facility];  Service: Obstetrics;  Laterality: N/A;  . LEEP     Family History: family history includes Diabetes in her father; Heart attack in her father; Hypertension in her father and mother. Social History:  reports that she quit smoking about 3 years ago. She smoked 0.50 packs per day. She has never used smokeless tobacco. She reports that she does not drink alcohol or use drugs.     Maternal Diabetes: Yes:  Diabetes Type:  Insulin/Medication controlled Genetic Screening: Normal Maternal Ultrasounds/Referrals: Normal Fetal Ultrasounds or other Referrals:  None Maternal Substance Abuse:  Yes:  Type: Other:  Significant Maternal Medications:  None Significant Maternal Lab Results:  None Other Comments:  None  Review of Systems  Constitutional: Negative.   All other systems reviewed and are negative.  Maternal Medical History:  Contractions: Frequency: rare.   Perceived severity is mild.    Fetal activity: Perceived fetal activity is normal.   Last perceived fetal movement was within the past hour.    Prenatal complications: Pre-eclampsia.   Prenatal Complications - Diabetes:  gestational. Diabetes is managed by oral agent (monotherapy).        There were no vitals taken for this visit. Maternal Exam:  Uterine Assessment: Contraction strength is mild.  Contraction frequency is rare.   Abdomen: Patient reports no abdominal tenderness. Surgical scars: low transverse.   Fetal presentation: vertex  Introitus: Normal vulva. Normal vagina.  Ferning test: not done.  Nitrazine test: not done. Amniotic fluid character: not assessed.  Pelvis: adequate for delivery.      Physical Exam  Nursing note and vitals reviewed. Constitutional: She is oriented to person, place, and time. She appears well-developed and well-nourished.  HENT:  Head: Normocephalic and atraumatic.  Eyes: Pupils are equal, round, and reactive to light.  Neck: Normal range of motion. Neck supple.  Cardiovascular: Normal rate and regular rhythm.   Respiratory: Effort normal and breath sounds normal.  GI: Soft. Bowel sounds are normal.  Genitourinary: Vagina normal and uterus normal.  Musculoskeletal: Normal range of motion.  Neurological: She is alert and oriented to person, place, and time. She has normal reflexes.  Skin: Skin is warm and dry.  Psychiatric: She has a normal mood and affect.    Prenatal labs: ABO, Rh:   Antibody:   Rubella:   RPR:    HBsAg:    HIV:    GBS:     Assessment/Plan: AMA 37 weeks PEC w/o severe features Previous csection For rpt csection. Consent done.   Dorice Stiggers J 05/22/2016,  9:37 PM

## 2016-05-23 ENCOUNTER — Encounter (HOSPITAL_COMMUNITY): Payer: Self-pay

## 2016-05-23 ENCOUNTER — Encounter (HOSPITAL_COMMUNITY)
Admission: RE | Disposition: A | Discharge status: Home / Self Care | Payer: Self-pay | Source: Ambulatory Visit | Attending: Obstetrics and Gynecology

## 2016-05-23 ENCOUNTER — Inpatient Hospital Stay (HOSPITAL_COMMUNITY)
Admission: RE | Admit: 2016-05-23 | Discharge: 2016-05-25 | DRG: 765 | Disposition: A | Discharge status: Home / Self Care | Payer: 59 | Source: Ambulatory Visit | Attending: Obstetrics and Gynecology | Admitting: Obstetrics and Gynecology

## 2016-05-23 ENCOUNTER — Inpatient Hospital Stay (HOSPITAL_COMMUNITY): Payer: 59 | Admitting: Anesthesiology

## 2016-05-23 DIAGNOSIS — Z833 Family history of diabetes mellitus: Secondary | ICD-10-CM | POA: Diagnosis not present

## 2016-05-23 DIAGNOSIS — Z3A37 37 weeks gestation of pregnancy: Secondary | ICD-10-CM

## 2016-05-23 DIAGNOSIS — O1494 Unspecified pre-eclampsia, complicating childbirth: Secondary | ICD-10-CM | POA: Diagnosis present

## 2016-05-23 DIAGNOSIS — Z8249 Family history of ischemic heart disease and other diseases of the circulatory system: Secondary | ICD-10-CM

## 2016-05-23 DIAGNOSIS — K219 Gastro-esophageal reflux disease without esophagitis: Secondary | ICD-10-CM | POA: Diagnosis present

## 2016-05-23 DIAGNOSIS — O139 Gestational [pregnancy-induced] hypertension without significant proteinuria, unspecified trimester: Secondary | ICD-10-CM | POA: Diagnosis present

## 2016-05-23 DIAGNOSIS — O34211 Maternal care for low transverse scar from previous cesarean delivery: Secondary | ICD-10-CM | POA: Diagnosis present

## 2016-05-23 DIAGNOSIS — Z87891 Personal history of nicotine dependence: Secondary | ICD-10-CM | POA: Diagnosis not present

## 2016-05-23 DIAGNOSIS — Z302 Encounter for sterilization: Secondary | ICD-10-CM

## 2016-05-23 DIAGNOSIS — G4730 Sleep apnea, unspecified: Secondary | ICD-10-CM | POA: Diagnosis present

## 2016-05-23 DIAGNOSIS — O24425 Gestational diabetes mellitus in childbirth, controlled by oral hypoglycemic drugs: Secondary | ICD-10-CM | POA: Diagnosis present

## 2016-05-23 DIAGNOSIS — O99214 Obesity complicating childbirth: Secondary | ICD-10-CM | POA: Diagnosis present

## 2016-05-23 DIAGNOSIS — O9962 Diseases of the digestive system complicating childbirth: Secondary | ICD-10-CM | POA: Diagnosis present

## 2016-05-23 DIAGNOSIS — Z6841 Body Mass Index (BMI) 40.0 and over, adult: Secondary | ICD-10-CM

## 2016-05-23 DIAGNOSIS — O1490 Unspecified pre-eclampsia, unspecified trimester: Secondary | ICD-10-CM | POA: Diagnosis present

## 2016-05-23 HISTORY — DX: Type 2 diabetes mellitus without complications: E11.9

## 2016-05-23 HISTORY — PX: TUBAL LIGATION: SHX77

## 2016-05-23 HISTORY — DX: Sleep apnea, unspecified: G47.30

## 2016-05-23 HISTORY — DX: Essential (primary) hypertension: I10

## 2016-05-23 LAB — CBC
HEMATOCRIT: 39.8 % (ref 36.0–46.0)
HEMOGLOBIN: 14 g/dL (ref 12.0–15.0)
MCH: 28.7 pg (ref 26.0–34.0)
MCHC: 35.2 g/dL (ref 30.0–36.0)
MCV: 81.6 fL (ref 78.0–100.0)
Platelets: 323 10*3/uL (ref 150–400)
RBC: 4.88 MIL/uL (ref 3.87–5.11)
RDW: 14 % (ref 11.5–15.5)
WBC: 13.3 10*3/uL — ABNORMAL HIGH (ref 4.0–10.5)

## 2016-05-23 LAB — COMPREHENSIVE METABOLIC PANEL
ALT: 22 U/L (ref 14–54)
AST: 32 U/L (ref 15–41)
Albumin: 3.3 g/dL — ABNORMAL LOW (ref 3.5–5.0)
Alkaline Phosphatase: 215 U/L — ABNORMAL HIGH (ref 38–126)
Anion gap: 10 (ref 5–15)
BILIRUBIN TOTAL: 0.1 mg/dL — AB (ref 0.3–1.2)
BUN: 14 mg/dL (ref 6–20)
CALCIUM: 9.2 mg/dL (ref 8.9–10.3)
CO2: 20 mmol/L — ABNORMAL LOW (ref 22–32)
CREATININE: 0.99 mg/dL (ref 0.44–1.00)
Chloride: 105 mmol/L (ref 101–111)
GFR calc Af Amer: >60 mL/min (ref >60)
Glucose, Bld: 90 mg/dL (ref 65–99)
Potassium: 4 mmol/L (ref 3.5–5.1)
Sodium: 135 mmol/L (ref 135–145)
TOTAL PROTEIN: 7.1 g/dL (ref 6.5–8.1)

## 2016-05-23 LAB — TYPE AND SCREEN
ABO/RH(D): B POS
Antibody Screen: NEGATIVE

## 2016-05-23 SURGERY — Surgical Case
Anesthesia: Spinal | Site: Abdomen

## 2016-05-23 MED ORDER — IBUPROFEN 600 MG PO TABS
600.0000 mg | ORAL_TABLET | Freq: Four times a day (QID) | ORAL | Status: DC
  Administered 2016-05-24 – 2016-05-25 (×6): 600 mg via ORAL
  Filled 2016-05-23 (×6): qty 1

## 2016-05-23 MED ORDER — OXYTOCIN 40 UNITS IN LACTATED RINGERS INFUSION - SIMPLE MED: 2.5000 [IU]/h | INTRAVENOUS | Status: AC

## 2016-05-23 MED ORDER — KETOROLAC TROMETHAMINE 30 MG/ML IJ SOLN: 30.0000 mg | Freq: Four times a day (QID) | INTRAMUSCULAR | Status: AC | PRN

## 2016-05-23 MED ORDER — SCOPOLAMINE 1 MG/3DAYS TD PT72
1.0000 | MEDICATED_PATCH | Freq: Once | TRANSDERMAL | Status: DC
  Administered 2016-05-23: 1.5 mg via TRANSDERMAL

## 2016-05-23 MED ORDER — IBUPROFEN 600 MG PO TABS: 600.0000 mg | ORAL_TABLET | Freq: Four times a day (QID) | ORAL | Status: DC | PRN

## 2016-05-23 MED ORDER — PRENATAL MULTIVITAMIN CH
1.0000 | ORAL_TABLET | Freq: Every day | ORAL | Status: DC
  Administered 2016-05-24 – 2016-05-25 (×2): 1 via ORAL
  Filled 2016-05-23 (×2): qty 1

## 2016-05-23 MED ORDER — MORPHINE SULFATE (PF) 0.5 MG/ML IJ SOLN
INTRAMUSCULAR | Status: DC | PRN
Start: 2016-05-23 — End: 2016-05-23
  Administered 2016-05-23: .2 mg via INTRATHECAL

## 2016-05-23 MED ORDER — MEPERIDINE HCL 25 MG/ML IJ SOLN: 6.2500 mg | INTRAMUSCULAR | Status: DC | PRN

## 2016-05-23 MED ORDER — SIMETHICONE 80 MG PO CHEW
80.0000 mg | CHEWABLE_TABLET | ORAL | Status: DC
  Administered 2016-05-23 – 2016-05-25 (×2): 80 mg via ORAL
  Filled 2016-05-23 (×2): qty 1

## 2016-05-23 MED ORDER — PHENYLEPHRINE HCL 10 MG/ML IJ SOLN: INTRAMUSCULAR | Status: DC | PRN

## 2016-05-23 MED ORDER — DIPHENHYDRAMINE HCL 25 MG PO CAPS: 25.0000 mg | ORAL_CAPSULE | Freq: Four times a day (QID) | ORAL | Status: DC | PRN

## 2016-05-23 MED ORDER — FENTANYL CITRATE (PF) 100 MCG/2ML IJ SOLN
INTRAMUSCULAR | Status: DC | PRN
  Administered 2016-05-23: 20 ug via INTRATHECAL

## 2016-05-23 MED ORDER — METHYLERGONOVINE MALEATE 0.2 MG PO TABS: 0.2000 mg | ORAL_TABLET | ORAL | Status: DC | PRN

## 2016-05-23 MED ORDER — LACTATED RINGERS IV SOLN
Freq: Once | INTRAVENOUS | Status: AC
  Administered 2016-05-23: 15:00:00 via INTRAVENOUS

## 2016-05-23 MED ORDER — SIMETHICONE 80 MG PO CHEW: 80.0000 mg | CHEWABLE_TABLET | ORAL | Status: DC | PRN

## 2016-05-23 MED ORDER — DIPHENHYDRAMINE HCL 50 MG/ML IJ SOLN: 12.5000 mg | INTRAMUSCULAR | Status: DC | PRN

## 2016-05-23 MED ORDER — NALBUPHINE HCL 10 MG/ML IJ SOLN: 5.0000 mg | INTRAMUSCULAR | Status: DC | PRN

## 2016-05-23 MED ORDER — METHYLERGONOVINE MALEATE 0.2 MG/ML IJ SOLN: 0.2000 mg | INTRAMUSCULAR | Status: DC | PRN

## 2016-05-23 MED ORDER — NALBUPHINE HCL 10 MG/ML IJ SOLN: 5.0000 mg | Freq: Once | INTRAMUSCULAR | Status: DC | PRN

## 2016-05-23 MED ORDER — SCOPOLAMINE 1 MG/3DAYS TD PT72
MEDICATED_PATCH | TRANSDERMAL | Status: AC
  Filled 2016-05-23: qty 1

## 2016-05-23 MED ORDER — DIBUCAINE 1 % RE OINT: 1.0000 "application " | TOPICAL_OINTMENT | RECTAL | Status: DC | PRN

## 2016-05-23 MED ORDER — OXYTOCIN 10 UNIT/ML IJ SOLN
INTRAMUSCULAR | Status: AC
  Filled 2016-05-23: qty 4

## 2016-05-23 MED ORDER — BUPIVACAINE HCL (PF) 0.25 % IJ SOLN
INTRAMUSCULAR | Status: AC
  Filled 2016-05-23: qty 30

## 2016-05-23 MED ORDER — MORPHINE SULFATE-NACL 0.5-0.9 MG/ML-% IV SOSY
PREFILLED_SYRINGE | INTRAVENOUS | Status: AC
  Filled 2016-05-23: qty 1

## 2016-05-23 MED ORDER — FENTANYL CITRATE (PF) 100 MCG/2ML IJ SOLN
INTRAMUSCULAR | Status: AC
  Filled 2016-05-23: qty 2

## 2016-05-23 MED ORDER — OXYTOCIN 40 UNITS IN LACTATED RINGERS INFUSION - SIMPLE MED
INTRAVENOUS | Status: DC | PRN
  Administered 2016-05-23: 40 [IU] via INTRAVENOUS

## 2016-05-23 MED ORDER — WITCH HAZEL-GLYCERIN EX PADS: 1.0000 "application " | MEDICATED_PAD | CUTANEOUS | Status: DC | PRN

## 2016-05-23 MED ORDER — TETANUS-DIPHTH-ACELL PERTUSSIS 5-2.5-18.5 LF-MCG/0.5 IM SUSP: 0.5000 mL | Freq: Once | INTRAMUSCULAR | Status: DC

## 2016-05-23 MED ORDER — ZOLPIDEM TARTRATE 5 MG PO TABS: 5.0000 mg | ORAL_TABLET | Freq: Every evening | ORAL | Status: DC | PRN

## 2016-05-23 MED ORDER — CEFAZOLIN SODIUM-DEXTROSE 2-4 GM/100ML-% IV SOLN: 2.0000 g | INTRAVENOUS | Status: DC

## 2016-05-23 MED ORDER — PHENYLEPHRINE 8 MG IN D5W 100 ML (0.08MG/ML) PREMIX OPTIME
INJECTION | INTRAVENOUS | Status: AC
  Filled 2016-05-23: qty 100

## 2016-05-23 MED ORDER — ONDANSETRON HCL 4 MG/2ML IJ SOLN
INTRAMUSCULAR | Status: AC
  Filled 2016-05-23: qty 2

## 2016-05-23 MED ORDER — METOCLOPRAMIDE HCL 5 MG/ML IJ SOLN
INTRAMUSCULAR | Status: DC | PRN
  Administered 2016-05-23 (×2): 5 mg via INTRAVENOUS

## 2016-05-23 MED ORDER — FENTANYL CITRATE (PF) 100 MCG/2ML IJ SOLN: 25.0000 ug | INTRAMUSCULAR | Status: DC | PRN

## 2016-05-23 MED ORDER — SIMETHICONE 80 MG PO CHEW
80.0000 mg | CHEWABLE_TABLET | Freq: Three times a day (TID) | ORAL | Status: DC
  Administered 2016-05-23 – 2016-05-25 (×4): 80 mg via ORAL
  Filled 2016-05-23 (×3): qty 1

## 2016-05-23 MED ORDER — LIDOCAINE-EPINEPHRINE (PF) 2 %-1:200000 IJ SOLN
INTRAMUSCULAR | Status: AC
  Filled 2016-05-23: qty 20

## 2016-05-23 MED ORDER — SODIUM BICARBONATE 8.4 % IV SOLN
INTRAVENOUS | Status: AC
  Filled 2016-05-23: qty 50

## 2016-05-23 MED ORDER — BUPIVACAINE IN DEXTROSE 0.75-8.25 % IT SOLN
INTRATHECAL | Status: DC | PRN
  Administered 2016-05-23: 2 mL via INTRATHECAL

## 2016-05-23 MED ORDER — ACETAMINOPHEN 500 MG PO TABS: 1000.0000 mg | ORAL_TABLET | Freq: Four times a day (QID) | ORAL | Status: AC

## 2016-05-23 MED ORDER — KETOROLAC TROMETHAMINE 30 MG/ML IJ SOLN
INTRAMUSCULAR | Status: AC
  Administered 2016-05-23: 30 mg
  Filled 2016-05-23: qty 1

## 2016-05-23 MED ORDER — NALOXONE HCL 2 MG/2ML IJ SOSY: 1.0000 ug/kg/h | PREFILLED_SYRINGE | INTRAVENOUS | Status: DC | PRN

## 2016-05-23 MED ORDER — SODIUM CHLORIDE 0.9% FLUSH: 3.0000 mL | INTRAVENOUS | Status: DC | PRN

## 2016-05-23 MED ORDER — SENNOSIDES-DOCUSATE SODIUM 8.6-50 MG PO TABS
2.0000 | ORAL_TABLET | ORAL | Status: DC
  Administered 2016-05-23 – 2016-05-25 (×2): 2 via ORAL
  Filled 2016-05-23 (×2): qty 2

## 2016-05-23 MED ORDER — LACTATED RINGERS IV SOLN
INTRAVENOUS | Status: DC
  Administered 2016-05-24: 03:00:00 via INTRAVENOUS

## 2016-05-23 MED ORDER — BUPIVACAINE HCL (PF) 0.25 % IJ SOLN
INTRAMUSCULAR | Status: DC | PRN
  Administered 2016-05-23: 30 mL

## 2016-05-23 MED ORDER — ONDANSETRON HCL 4 MG/2ML IJ SOLN
INTRAMUSCULAR | Status: DC | PRN
  Administered 2016-05-23: 4 mg via INTRAVENOUS

## 2016-05-23 MED ORDER — OXYCODONE-ACETAMINOPHEN 5-325 MG PO TABS
2.0000 | ORAL_TABLET | ORAL | Status: DC | PRN
  Administered 2016-05-24 – 2016-05-25 (×3): 2 via ORAL
  Filled 2016-05-23 (×4): qty 2

## 2016-05-23 MED ORDER — OXYCODONE-ACETAMINOPHEN 5-325 MG PO TABS
1.0000 | ORAL_TABLET | ORAL | Status: DC | PRN
  Administered 2016-05-24 (×2): 1 via ORAL
  Filled 2016-05-23 (×2): qty 1

## 2016-05-23 MED ORDER — COCONUT OIL OIL: 1.0000 "application " | TOPICAL_OIL | Status: DC | PRN

## 2016-05-23 MED ORDER — PHENYLEPHRINE 40 MCG/ML (10ML) SYRINGE FOR IV PUSH (FOR BLOOD PRESSURE SUPPORT)
PREFILLED_SYRINGE | INTRAVENOUS | Status: AC
  Filled 2016-05-23: qty 10

## 2016-05-23 MED ORDER — DIPHENHYDRAMINE HCL 25 MG PO CAPS: 25.0000 mg | ORAL_CAPSULE | ORAL | Status: DC | PRN

## 2016-05-23 MED ORDER — DEXTROSE 5 % IV SOLN
INTRAVENOUS | Status: DC | PRN
  Administered 2016-05-23: 3 g via INTRAVENOUS

## 2016-05-23 MED ORDER — PHENYLEPHRINE HCL 10 MG/ML IJ SOLN
INTRAMUSCULAR | Status: DC | PRN
  Administered 2016-05-23: 40 ug via INTRAVENOUS

## 2016-05-23 MED ORDER — PHENYLEPHRINE 8 MG IN D5W 100 ML (0.08MG/ML) PREMIX OPTIME
INJECTION | INTRAVENOUS | Status: DC | PRN
  Administered 2016-05-23: 40 ug/min via INTRAVENOUS

## 2016-05-23 MED ORDER — NALOXONE HCL 0.4 MG/ML IJ SOLN: 0.4000 mg | INTRAMUSCULAR | Status: DC | PRN

## 2016-05-23 MED ORDER — LACTATED RINGERS IV SOLN
INTRAVENOUS | Status: DC | PRN
  Administered 2016-05-23 (×2): via INTRAVENOUS

## 2016-05-23 MED ORDER — ACETAMINOPHEN 325 MG PO TABS: 650.0000 mg | ORAL_TABLET | ORAL | Status: DC | PRN

## 2016-05-23 MED ORDER — ONDANSETRON HCL 4 MG/2ML IJ SOLN: 4.0000 mg | Freq: Three times a day (TID) | INTRAMUSCULAR | Status: DC | PRN

## 2016-05-23 MED ORDER — LACTATED RINGERS IV SOLN
INTRAVENOUS | Status: DC
  Administered 2016-05-23: 16:00:00 via INTRAVENOUS

## 2016-05-23 MED ORDER — MENTHOL 3 MG MT LOZG: 1.0000 | LOZENGE | OROMUCOSAL | Status: DC | PRN

## 2016-05-23 SURGICAL SUPPLY — 38 items
APL SKNCLS STERI-STRIP NONHPOA (GAUZE/BANDAGES/DRESSINGS) ×3
BENZOIN TINCTURE PRP APPL 2/3 (GAUZE/BANDAGES/DRESSINGS) ×3 IMPLANT
CLAMP CORD UMBIL (MISCELLANEOUS) IMPLANT
CLOSURE STERI STRIP 1/2 X4 (GAUZE/BANDAGES/DRESSINGS) ×3 IMPLANT
CLOTH BEACON ORANGE TIMEOUT ST (SAFETY) ×5 IMPLANT
CONTAINER PREFILL 10% NBF 15ML (MISCELLANEOUS) IMPLANT
DRSG OPSITE POSTOP 4X10 (GAUZE/BANDAGES/DRESSINGS) ×5 IMPLANT
DURAPREP 26ML APPLICATOR (WOUND CARE) ×5 IMPLANT
ELECT REM PT RETURN 9FT ADLT (ELECTROSURGICAL) ×5
ELECTRODE REM PT RTRN 9FT ADLT (ELECTROSURGICAL) ×3 IMPLANT
EXTRACTOR VACUUM M CUP 4 TUBE (SUCTIONS) IMPLANT
EXTRACTOR VACUUM M CUP 4' TUBE (SUCTIONS)
GLOVE BIO SURGEON STRL SZ7.5 (GLOVE) ×5 IMPLANT
GLOVE BIOGEL PI IND STRL 7.0 (GLOVE) ×3 IMPLANT
GLOVE BIOGEL PI INDICATOR 7.0 (GLOVE) ×2
GOWN STRL REUS W/TWL LRG LVL3 (GOWN DISPOSABLE) ×10 IMPLANT
KIT ABG SYR 3ML LUER SLIP (SYRINGE) IMPLANT
NDL HYPO 25X5/8 SAFETYGLIDE (NEEDLE) IMPLANT
NDL SPNL 20GX3.5 QUINCKE YW (NEEDLE) IMPLANT
NEEDLE HYPO 22GX1.5 SAFETY (NEEDLE) ×5 IMPLANT
NEEDLE HYPO 25X5/8 SAFETYGLIDE (NEEDLE) IMPLANT
NEEDLE SPNL 20GX3.5 QUINCKE YW (NEEDLE) IMPLANT
NS IRRIG 1000ML POUR BTL (IV SOLUTION) ×5 IMPLANT
PACK C SECTION WH (CUSTOM PROCEDURE TRAY) ×5 IMPLANT
PAD OB MATERNITY 4.3X12.25 (PERSONAL CARE ITEMS) ×5 IMPLANT
SUT MNCRL 0 VIOLET CTX 36 (SUTURE) ×6 IMPLANT
SUT MNCRL AB 3-0 PS2 27 (SUTURE) IMPLANT
SUT MON AB 2-0 CT1 27 (SUTURE) ×5 IMPLANT
SUT MON AB-0 CT1 36 (SUTURE) ×10 IMPLANT
SUT MONOCRYL 0 CTX 36 (SUTURE) ×4
SUT PLAIN 0 NONE (SUTURE) IMPLANT
SUT PLAIN 2 0 (SUTURE)
SUT PLAIN 2 0 XLH (SUTURE) IMPLANT
SUT PLAIN ABS 2-0 CT1 27XMFL (SUTURE) IMPLANT
SYR 20CC LL (SYRINGE) IMPLANT
SYR CONTROL 10ML LL (SYRINGE) ×5 IMPLANT
TOWEL OR 17X24 6PK STRL BLUE (TOWEL DISPOSABLE) ×5 IMPLANT
TRAY FOLEY CATH SILVER 14FR (SET/KITS/TRAYS/PACK) ×5 IMPLANT

## 2016-05-23 NOTE — Transfer of Care (Signed)
Immediate Anesthesia Transfer of Care Note  Patient: Mia Parker  Procedure(s) Performed: Procedure(s): CESAREAN SECTION (N/A) BILATERAL TUBAL LIGATION (Bilateral)  Patient Location: PACU  Anesthesia Type:Spinal  Level of Consciousness: awake, alert  and oriented  Airway & Oxygen Therapy: Patient Spontanous Breathing  Post-op Assessment: Report given to RN and Post -op Vital signs reviewed and stable  Post vital signs: Reviewed and stable  Last Vitals:  Vitals:   05/23/16 1638 05/23/16 1640  BP: (!) 121/98   Pulse:  86  Resp:  (P) 20  Temp:  (P) 36.3 C    Last Pain:  Vitals:   05/23/16 1640  TempSrc: (P) Oral      Patients Stated Pain Goal: 3 (34/03/52 4818)  Complications: No apparent anesthesia complications

## 2016-05-23 NOTE — Progress Notes (Signed)
Patient ID: Mia Parker, female   DOB: 06/16/1971, 45 y.o.   MRN: [id number] Patient seen and examined. Consent witnessed and signed. No changes noted. Update completed.

## 2016-05-23 NOTE — Op Note (Signed)
Cesarean Section Procedure Note  Indications: previous uterine incision Kerr x one and PEC w/o severe features  Pre-operative Diagnosis: 37 week 1 day pregnancy.  Post-operative Diagnosis: same  Surgeon: Lovenia Kim   Assistants: Eddie Dibbles, CNM  Anesthesia: General LMA anesthesia and Spinal anesthesia  ASA Class: 2  Procedure Details  The patient was seen in the Holding Room. The risks, benefits, complications, treatment options, and expected outcomes were discussed with the patient.  The patient concurred with the proposed plan, giving informed consent. The risks of anesthesia, infection, bleeding and possible injury to other organs discussed. Injury to bowel, bladder, or ureter with possible need for repair discussed. Possible need for transfusion with secondary risks of hepatitis or HIV acquisition discussed. Post operative complications to include but not limited to DVT, PE and Pneumonia noted. The site of surgery properly noted/marked. The patient was taken to Operating Room # 9, identified as Mia Parker and the procedure verified as C-Section Delivery. A Time Out was held and the above information confirmed.  After induction of anesthesia, the patient was draped and prepped in the usual sterile manner. A Pfannenstiel incision was made and carried down through the subcutaneous tissue to the fascia. Fascial incision was made and extended transversely using Mayo scissors. The fascia was separated from the underlying rectus tissue superiorly and inferiorly. The peritoneum was identified and entered. Peritoneal incision was extended longitudinally. The utero-vesical peritoneal reflection was incised transversely and the bladder flap was bluntly freed from the lower uterine segment. A low transverse uterine incision(Kerr hysterotomy) was made. Delivered from OA presentation was a  female with Apgar scores of 8 at one minute and 9 at five minutes. Bulb suctioning gently performed. Neonatal  team in attendance.After the umbilical cord was clamped and cut cord blood was obtained for evaluation. The placenta was removed intact and appeared normal. The uterus was curetted with a dry lap pack. Good hemostasis was noted.The uterine outline, tubes and ovaries appeared normal. The uterine incision was closed with running locked sutures of 0 Monocryl x 1 layers. Modifiedn Pomeroy bilateral partial salpingectomy.Hemostasis was observed. Lavage was carried out until clear.The parietal peritoneum was closed with a running 2-0 Monocryl suture. The fascia was then reapproximated with running sutures of 0 Monocryl. The skin was reapproximated with 3-0 monocryl after Ulen closure with 2-0 plain.  Instrument, sponge, and needle counts were correct prior the abdominal closure and at the conclusion of the case.   Findings: FTLF, OA, nl tubes and ovaries  Estimated Blood Loss:  300 mL         Drains: foley                 Specimens: tubal segments x 2                 Complications:  None; patient tolerated the procedure well.         Disposition: PACU - hemodynamically stable.         Condition: stable  Attending Attestation: I performed the procedure.

## 2016-05-23 NOTE — Anesthesia Preprocedure Evaluation (Addendum)
Anesthesia Evaluation  Patient identified by MRN, date of birth, ID band Patient awake    Reviewed: Allergy & Precautions, NPO status , Patient's Chart, lab work & pertinent test results  Airway Mallampati: II       Dental no notable dental hx. (+) Teeth Intact   Pulmonary sleep apnea and Continuous Positive Airway Pressure Ventilation , former smoker,    Pulmonary exam normal breath sounds clear to auscultation       Cardiovascular hypertension, Pt. on medications Normal cardiovascular exam Rhythm:Regular Rate:Normal     Neuro/Psych PSYCHIATRIC DISORDERS ADD   GI/Hepatic Neg liver ROS, GERD  Medicated and Controlled,  Endo/Other  diabetes, Well Controlled, Gestational, Oral Hypoglycemic AgentsMorbid obesity  Renal/GU negative Renal ROS  negative genitourinary   Musculoskeletal negative musculoskeletal ROS (+)   Abdominal (+) + obese,   Peds  Hematology negative hematology ROS (+)   Anesthesia Other Findings   Reproductive/Obstetrics (+) Pregnancy AMA Desires sterilization                              Chemistry      Component Value Date/Time   NA 136 09/14/2015 0001   K 4.4 09/14/2015 0001   CL 102 09/14/2015 0001   CO2 28 09/14/2015 0001   BUN 11 09/14/2015 0001   CREATININE 0.69 09/14/2015 0001      Component Value Date/Time   CALCIUM 8.9 09/14/2015 0001   ALKPHOS 87 09/14/2015 0001   AST 22 09/14/2015 0001   ALT 20 09/14/2015 0001   BILITOT 0.7 09/14/2015 0001     This SmartLink has not been configured with any valid records.   Lab Results  Component Value Date   WBC 13.3 (H) 05/23/2016   HGB 14.0 05/23/2016   HCT 39.8 05/23/2016   MCV 81.6 05/23/2016   PLT 323 05/23/2016    Anesthesia Physical Anesthesia Plan  ASA: III  Anesthesia Plan: Spinal   Post-op Pain Management:    Induction:   Airway Management Planned: Natural Airway  Additional Equipment:    Intra-op Plan:   Post-operative Plan:   Informed Consent: I have reviewed the patients History and Physical, chart, labs and discussed the procedure including the risks, benefits and alternatives for the proposed anesthesia with the patient or authorized representative who has indicated his/her understanding and acceptance.   Dental advisory given  Plan Discussed with: Anesthesiologist, CRNA and Surgeon  Anesthesia Plan Comments:         Anesthesia Quick Evaluation

## 2016-05-23 NOTE — Progress Notes (Signed)
Respiratory Therapy called for CPAP order by Dr. Royce Macadamia MD. Pt has own CPAP in car. RT spoke with Pt. Over phone and asked pt to use own machine from home doses as the home therapy agency told her. Pt's husband brought her machine and mask in from home and will hook it up. O2 sat. Ok at all check so far tonight.

## 2016-05-23 NOTE — Anesthesia Procedure Notes (Signed)
Spinal  Patient location during procedure: OR Start time: 05/23/2016 3:33 PM Staffing Anesthesiologist: Josephine Igo Performed: anesthesiologist  Preanesthetic Checklist Completed: patient identified, site marked, surgical consent, pre-op evaluation, timeout performed, IV checked, risks and benefits discussed and monitors and equipment checked Spinal Block Patient position: sitting Prep: site prepped and draped and DuraPrep Patient monitoring: heart rate, cardiac monitor, continuous pulse ox and blood pressure Approach: midline Location: L3-4 Injection technique: single-shot Needle Needle type: Sprotte  Needle gauge: 24 G Needle length: 9 cm Needle insertion depth: 7 cm Assessment Sensory level: T4 Additional Notes Patient tolerated procedure well. Adequate sensory level.

## 2016-05-23 NOTE — Anesthesia Postprocedure Evaluation (Signed)
Anesthesia Post Note  Patient: KITTY CADAVID  Procedure(s) Performed: Procedure(s) (LRB): CESAREAN SECTION (N/A) BILATERAL TUBAL LIGATION (Bilateral)  Patient location during evaluation: PACU Anesthesia Type: Spinal Level of consciousness: awake and alert and oriented Pain management: pain level controlled Vital Signs Assessment: post-procedure vital signs reviewed and stable Respiratory status: spontaneous breathing, nonlabored ventilation and respiratory function stable Cardiovascular status: blood pressure returned to baseline and stable Postop Assessment: no headache, no backache, spinal receding and no signs of nausea or vomiting Anesthetic complications: no     Last Vitals:  Vitals:   05/23/16 1710 05/23/16 1711  BP:    Pulse: 76 70  Resp: 15 18  Temp:      Last Pain:  Vitals:   05/23/16 1640  TempSrc: Oral   Pain Goal: Patients Stated Pain Goal: 0 (05/23/16 1701)               Bettyjane Shenoy A.

## 2016-05-23 NOTE — Progress Notes (Signed)
Discussed with Dr Royce Macadamia pateint's limited movement in lower extremities post op. Updated on patient's spinal level at umbilicus, vaginal bleeding and vitals. Orders given to continue monitoring.

## 2016-05-23 NOTE — Lactation Note (Signed)
This note was copied from a baby's chart. Lactation Consultation Note  P2.  Mother GDM.  27 1 hour old and STS on mother's chest.  Ex BF.  Had previously attempted latching with RN. Mother has large pendulous breasts.  Hand expressed glistening of colostrum from R nipple. Allowed baby to suckle on R breast but did not latch.  Returned baby STS to mother's chest. Encouraged STS and watch for feeding cues.   Patient Name: Mia Parker [id number] Date: 05/23/2016 Reason for consult: Initial assessment   Maternal Data    Feeding    LATCH Score/Interventions                      Lactation Tools Discussed/Used     Consult Status Consult Status: Follow-up Date: 05/24/16 Follow-up type: In-patient    Vivianne Master [facility] 05/23/2016, 5:15 PM

## 2016-05-24 LAB — CBC
HCT: 34.4 % — ABNORMAL LOW (ref 36.0–46.0)
HEMOGLOBIN: 11.7 g/dL — AB (ref 12.0–15.0)
MCH: 27.9 pg (ref 26.0–34.0)
MCHC: 34 g/dL (ref 30.0–36.0)
MCV: 82.1 fL (ref 78.0–100.0)
Platelets: 238 10*3/uL (ref 150–400)
RBC: 4.19 MIL/uL (ref 3.87–5.11)
RDW: 14.3 % (ref 11.5–15.5)
WBC: 10.7 10*3/uL — AB (ref 4.0–10.5)

## 2016-05-24 LAB — RPR: RPR: NONREACTIVE

## 2016-05-24 NOTE — Progress Notes (Signed)
Patient ID: Mia Parker, female   DOB: [birth year], 45 y.o.   MRN: [id number] Subjective: S/P Elective Repeat Cesarean Delivery / Bilateral Tubal Ligation POD# 1 Information for the patient's newborn:  Mia Parker, Mia Parker [[id number]]  female  Reports feeling well. Feeding: breast Patient reports tolerating PO.  Breast symptoms: none Pain controlled with ibuprofen (OTC) Denies HA/SOB/C/P/N/V/dizziness. Flatus present. No BM. She reports vaginal bleeding as normal, without clots.  She is ambulating, Foley just d/c'd. Not urinating on own yet.     Objective:   VS:  Vitals:   05/23/16 2137 05/23/16 2233 05/24/16 0230 05/24/16 0500  BP: (!) 117/58 (!) 117/58 113/62   Pulse: 74 83 84   Resp: 16 16 18 18   Temp: 97.8 F (36.6 C) 97.8 F (36.6 C) 98.5 F (36.9 C) 98.5 F (36.9 C)  TempSrc: Oral Oral Oral Oral  SpO2: 93% 93% 94% 94%  Weight:      Height:         Intake/Output Summary (Last 24 hours) at 05/24/16 1138 Last data filed at 05/24/16 6503  Gross per 24 hour  Intake             4080 ml  Output             1655 ml  Net             2425 ml        Recent Labs  05/23/16 1411 05/24/16 0529  WBC 13.3* 10.7*  HGB 14.0 11.7*  HCT 39.8 34.4*  PLT 323 238     Blood type: --/--/B POS (09/08 1411)  Rubella:   Immune    Physical Exam:   General: alert, cooperative, no distress and morbidly obese  CV: Regular rate and rhythm, S1S2 present or without murmur or extra heart sounds  Resp: clear  Abdomen: soft, nontender, normal bowel sounds  Incision: clean, dry and intact  Uterine Fundus: firm, 1 FB below umbilicus, nontender  Lochia: minimal  Ext: extremities normal, atraumatic, no cyanosis or edema, Homans sign is negative, no sign of DVT and no edema, redness or tenderness in the calves or thighs   Assessment/Plan: 45 y.o.   POD# 1.  S/P Cesarean Delivery.  Indications: elective repeat with BTL                Principal Problem:   Postpartum care following RC/S  & BTL (9/8) Active Problems:   Gestational hypertension   Preeclampsia Morbid Obesity Sleep Apnea  Doing well, stable.               Regular diet as tolerated D/C IV per unit protocol Ambulate Routine post-op care Continue with CPAP hs  Laury Deep, Jerilynn Mages, MSN, CNM 05/24/2016, 11:38 AM

## 2016-05-24 NOTE — Addendum Note (Signed)
Addendum  created 05/24/16 2575 by Flossie Dibble, CRNA   Sign clinical note

## 2016-05-24 NOTE — Progress Notes (Signed)
Patient told to ambulate halls. Positive flatus noted. Patient informed of plan of care and told to feed infant on cues and hand express . Patient told to use incentive spirometer and keep scds on while in bed.

## 2016-05-24 NOTE — Lactation Note (Signed)
This note was copied from a baby's chart. Lactation Consultation Note  Patient Name: Mia Parker [id number] Date: 05/24/2016 Reason for consult: Follow-up assessment  Baby 10 hours old. Mom reports that baby is nursing well, she is hearing swallows while baby at the breast. Mom states that she had a hard time getting her milk to flow when pumping with first baby, but is able to hand express well. Mom states that she was able to use hospital-grade pump earlier and pumped 15 ml which she spoon-fed to the baby. Mom states that she has purchased a Spectra for home use this time. Enc mom to call out for assistance as needed with latching the baby. Maternal Data Has patient been taught Hand Expression?: Yes Does the patient have breastfeeding experience prior to this delivery?: Yes  Feeding Feeding Type: Breast Milk  LATCH Score/Interventions                      Lactation Tools Discussed/Used Pump Review: Setup, frequency, and cleaning Initiated by:: Candyce Churn RN Date initiated:: 05/24/16   Consult Status Consult Status: Follow-up Date: 05/25/16 Follow-up type: In-patient    Andres Labrum 05/24/2016, 10:57 PM

## 2016-05-24 NOTE — Progress Notes (Signed)
Anesthesiologist on called and  notified of Dr. Luis Abed order to use pt's home mandibular mouth piece and to monitor her CO2 with PCA CO2 monitor. Pt does not use CO2 monitor at home and is on continuous pulse ox. She uses a face mask on her cpap machine. The nasal cannula would not fit under mask. Anesthesiologist said to let her use own mask and pulse ox continuous and not to monitor with CO2 machine.

## 2016-05-24 NOTE — Anesthesia Postprocedure Evaluation (Signed)
Anesthesia Post Note  Patient: DASHANAE LONGFIELD  Procedure(s) Performed: Procedure(s) (LRB): CESAREAN SECTION (N/A) BILATERAL TUBAL LIGATION (Bilateral)  Patient location during evaluation: Mother Baby Anesthesia Type: Spinal Level of consciousness: awake and alert and oriented Pain management: satisfactory to patient Vital Signs Assessment: post-procedure vital signs reviewed and stable Respiratory status: respiratory function stable and spontaneous breathing Cardiovascular status: blood pressure returned to baseline Postop Assessment: no headache, no backache, spinal receding, patient able to bend at knees and adequate PO intake Anesthetic complications: no     Last Vitals:  Vitals:   05/24/16 0230 05/24/16 0500  BP: 113/62   Pulse: 84   Resp: 18 18  Temp: 36.9 C 36.9 C    Last Pain:  Vitals:   05/24/16 0610  TempSrc:   PainSc: 6    Pain Goal: Patients Stated Pain Goal: 0 (05/23/16 2233)               Shaquel Josephson

## 2016-05-25 MED ORDER — OXYCODONE-ACETAMINOPHEN 5-325 MG PO TABS: 2.0000 | ORAL_TABLET | ORAL | 0 refills | Status: DC | PRN

## 2016-05-25 MED ORDER — LABETALOL HCL 100 MG PO TABS
100.0000 mg | ORAL_TABLET | Freq: Two times a day (BID) | ORAL | Status: DC
  Filled 2016-05-25: qty 1

## 2016-05-25 MED ORDER — HYDROCHLOROTHIAZIDE 25 MG PO TABS
25.0000 mg | ORAL_TABLET | Freq: Every day | ORAL | Status: DC
  Administered 2016-05-25: 25 mg via ORAL
  Filled 2016-05-25 (×2): qty 1

## 2016-05-25 MED ORDER — IBUPROFEN 600 MG PO TABS: 600.0000 mg | ORAL_TABLET | Freq: Four times a day (QID) | ORAL | 0 refills | Status: DC

## 2016-05-25 MED ORDER — HYDROCHLOROTHIAZIDE 25 MG PO TABS: 25.0000 mg | ORAL_TABLET | Freq: Every day | ORAL | 0 refills | Status: DC

## 2016-05-25 NOTE — Discharge Summary (Signed)
OB Discharge Summary     Patient Name: Mia Parker DOB: 1970-11-20 MRN: [id number]  Date of admission: 05/23/2016 Delivering MD: Brien Few   Date of discharge: 05/25/2016  Admitting diagnosis: Previous Cesarean Section Intrauterine pregnancy: [redacted]w[redacted]d    Secondary diagnosis:  Principal Problem:   Postpartum care following RC/S & BTL (9/8) Active Problems:   Gestational hypertension   Preeclampsia  Additional problems: none     Discharge diagnosis: Term Pregnancy Delivered                                                                                                Post partum procedures:none  Augmentation: N/A  Complications: None  Hospital course:  Sceduled C/S   45y.o. yo [id number] admitted to the hospital 05/23/2016 for scheduled cesarean section with the following indication:Elective Repeat and BTL.  Membrane Rupture Time/Date: 3:52 PM ,05/23/2016   Patient delivered a Viable infant.05/23/2016  Details of operation can be found in separate operative note.  Pateint had an uncomplicated postpartum course.  She is ambulating, tolerating a regular diet, passing flatus, and urinating well. Patient is discharged home in stable condition on  05/25/16          Physical exam Vitals:   05/24/16 1902 05/25/16 0625 05/25/16 1322 05/25/16 1500  BP: (!) 119/58 (!) 143/83 129/70 130/64  Pulse:  90 72 90  Resp:  17    Temp:  98.1 F (36.7 C)    TempSrc:  Oral    SpO2:      Weight:      Height:       General: alert, cooperative and no distress Lochia: appropriate Uterine Fundus: firm Incision: Healing well with no significant drainage, No significant erythema, Dressing is clean, dry, and intact DVT Evaluation: No evidence of DVT seen on physical exam. Negative Homan's sign. No cords or calf tenderness. 2+ Calf/Ankle edema is present  Labs: Lab Results  Component Value Date   WBC 10.7 (H) 05/24/2016   HGB 11.7 (L) 05/24/2016   HCT 34.4 (L) 05/24/2016   MCV 82.1 05/24/2016   PLT 238 05/24/2016   CMP Latest Ref Rng & Units 05/23/2016  Glucose 65 - 99 mg/dL 90  BUN 6 - 20 mg/dL 14  Creatinine 0.44 - 1.00 mg/dL 0.99  Sodium 135 - 145 mmol/L 135  Potassium 3.5 - 5.1 mmol/L 4.0  Chloride 101 - 111 mmol/L 105  CO2 22 - 32 mmol/L 20(L)  Calcium 8.9 - 10.3 mg/dL 9.2  Total Protein 6.5 - 8.1 g/dL 7.1  Total Bilirubin 0.3 - 1.2 mg/dL 0.1(L)  Alkaline Phos 38 - 126 U/L 215(H)  AST 15 - 41 U/L 32  ALT 14 - 54 U/L 22    Discharge instruction: per After Visit Summary and "Baby and Me Booklet".  After visit meds:    Medication List    TAKE these medications   amphetamine-dextroamphetamine 25 MG 24 hr capsule Commonly known as:  ADDERALL XR Take 1 capsule by mouth 2 (two) times daily.   amphetamine-dextroamphetamine 25 MG 24 hr capsule Commonly known  as:  ADDERALL XR Take 1 capsule by mouth 2 (two) times daily.   amphetamine-dextroamphetamine 25 MG 24 hr capsule Commonly known as:  ADDERALL XR Take 1 capsule by mouth 2 (two) times daily.   hydrochlorothiazide 25 MG tablet Commonly known as:  HYDRODIURIL Take 1 tablet (25 mg total) by mouth daily.   ibuprofen 600 MG tablet Commonly known as:  ADVIL,MOTRIN Take 1 tablet (600 mg total) by mouth every 6 (six) hours.   metFORMIN 500 MG tablet Commonly known as:  GLUCOPHAGE Take 500 mg by mouth 2 (two) times daily.   oxyCODONE-acetaminophen 5-325 MG tablet Commonly known as:  PERCOCET/ROXICET Take 2 tablets by mouth every 4 (four) hours as needed (pain scale > 7).   prenatal multivitamin Tabs tablet Take 1 tablet by mouth daily at 12 noon.       Diet: routine diet  Activity: Advance as tolerated. Pelvic rest for 6 weeks.   Outpatient follow up:1 week BP re-check and 6 weeks postpartum visit Follow up Appt:No future appointments. Follow up Visit:No Follow-up on file.  Postpartum contraception: Tubal Ligation  Newborn Data: Live born female on 05/23/2016 Birth Weight: 7  lb 1.6 oz (3220 g) APGAR: 9, 10  Baby Feeding: Breast Disposition:home with mother   05/25/2016 Laury Deep, Jerilynn Mages, CNM

## 2016-05-25 NOTE — Progress Notes (Signed)
Patient encouraged to ambulate halls, keep scds on while in bed. Patient denies blurred vision. Negative Homans sign noted, however 1+ clonus noted. Patient voiding without difficulty.

## 2016-05-25 NOTE — Progress Notes (Signed)
Mia Parker notified of patient's blood pressures from today and of clonus of 1 plus. Patient is not light headed or dizzy nor has epigastric pain.

## 2016-05-25 NOTE — Progress Notes (Signed)
Patient notified RN of golf ball sized blood clot upon voiding in the bathroom.  Lochia small, no blurred vision or dizziness noted.

## 2016-05-25 NOTE — Discharge Instructions (Signed)
Breast Pumping Tips If you are breastfeeding, there may be times when you cannot feed your baby directly. Returning to work or going on a trip are common examples. Pumping allows you to store breast milk and feed it to your baby later.  You may not get much milk when you first start to pump. Your breasts should start to make more after a few days. If you pump at the times you usually feed your baby, you may be able to keep making enough milk to feed your baby without also using formula. The more often you pump, the more milk you will produce. WHEN SHOULD I PUMP?   You can begin to pump soon after delivery. However, some experts recommend waiting about 4 weeks before giving your infant a bottle to make sure breastfeeding is going well.  If you plan to return to work, begin pumping a few weeks before. This will help you develop techniques that work best for you. It also lets you build up a supply of breast milk.   When you are with your infant, feed on demand and pump after each feeding.   When you are away from your infant for several hours, pump for about 15 minutes every 2-3 hours. Pump both breasts at the same time if you can.   If your infant has a formula feeding, make sure to pump around the same time.   If you drink any alcohol, wait 2 hours before pumping.  HOW DO I PREPARE TO PUMP? Your let-down reflexis the natural reaction to stimulation that makes your breast milk flow. It is easier to stimulate this reflex when you are relaxed. Find relaxation techniques that work for you. If you have difficulty with your let-down reflex, try these methods:   Smell one of your infant's blankets or an item of clothing.   Look at a picture or video of your infant.   Sit in a quiet, private space.   Massage the breast you plan to pump.   Place soothing warmth on the breast.   Play relaxing music.  WHAT ARE SOME GENERAL BREAST PUMPING TIPS?  Wash your hands before you pump. You  do not need to wash your nipples or breasts.  There are three ways to pump.  You can use your hand to massage and compress your breast.  You can use a handheld manual pump.  You can use an electric pump.   Make sure the suction cup (flange) on the breast pump is the right size. Place the flange directly over the nipple. If it is the wrong size or placed the wrong way, it may be painful and cause nipple damage.   If pumping is uncomfortable, apply a small amount of purified or modified lanolin to your nipple and areola.  If you are using an electric pump, adjust the speed and suction power to be more comfortable.  If pumping is painful or if you are not getting very much milk, you may need a different type of pump. A lactation consultant can help you determine what type of pump to use.   Keep a full water bottle near you at all times. Drinking lots of fluid helps you make more milk.  You can store your milk to use later. Pumped breast milk can be stored in a sealable, sterile container or plastic bag. Label all stored breast milk with the date you pumped it.  Milk can stay out at room temperature for up to 8 hours.  You can store your milk in the refrigerator for up to 8 days.  You can store your milk in the freezer for 3 months. Thaw frozen milk using warm water. Do not put it in the microwave.  Do not smoke. Smoking can lower your milk supply and harm your infant. If you need help quitting, ask your health care provider to recommend a program.  [place] A LACTATION CONSULTANT?  You are having trouble pumping.  You are concerned that you are not making enough milk.  You have nipple pain, soreness, or redness.  You want to use birth control. Birth control pills may lower your milk supply. Talk to your health care provider about your options.   This information is not intended to replace advice given to you by your health care provider.  Make sure you discuss any questions you have with your health care provider.   Document Released: 02/19/2010 Document Revised: 09/06/2013 Document Reviewed: 06/24/2013 Elsevier Interactive Patient Education 2016 [employer]. Postpartum Depression and Baby Blues The postpartum period begins right after the birth of a baby. During this time, there is often a great amount of joy and excitement. It is also a time of many changes in the life of the parents. Regardless of how many times a mother gives birth, each child brings new challenges and dynamics to the family. It is not unusual to have feelings of excitement along with confusing shifts in moods, emotions, and thoughts. All mothers are at risk of developing postpartum depression or the "baby blues." These mood changes can occur right after giving birth, or they may occur many months after giving birth. The baby blues or postpartum depression can be mild or severe. Additionally, postpartum depression can go away rather quickly, or it can be a long-term condition.  CAUSES Raised hormone levels and the rapid drop in those levels are thought to be a main cause of postpartum depression and the baby blues. A number of hormones change during and after pregnancy. Estrogen and progesterone usually decrease right after the delivery of your baby. The levels of thyroid hormone and various cortisol steroids also rapidly drop. Other factors that play a role in these mood changes include major life events and genetics.  RISK FACTORS If you have any of the following risks for the baby blues or postpartum depression, know what symptoms to watch out for during the postpartum period. Risk factors that may increase the likelihood of getting the baby blues or postpartum depression include:  Having a personal or family history of depression.   Having depression while being pregnant.   Having premenstrual mood issues or mood issues related to oral  contraceptives.  Having a lot of life stress.   Having marital conflict.   Lacking a social support network.   Having a baby with special needs.   Having health problems, such as diabetes.  SIGNS AND SYMPTOMS Symptoms of baby blues include:  Brief changes in mood, such as going from extreme happiness to sadness.  Decreased concentration.   Difficulty sleeping.   Crying spells, tearfulness.   Irritability.   Anxiety.  Symptoms of postpartum depression typically begin within the first month after giving birth. These symptoms include:  Difficulty sleeping or excessive sleepiness.   Marked weight loss.   Agitation.   Feelings of worthlessness.   Lack of interest in activity or food.  Postpartum psychosis is a very serious condition and can be dangerous. Fortunately, it is  rare. Displaying any of the following symptoms is cause for immediate medical attention. Symptoms of postpartum psychosis include:   Hallucinations and delusions.   Bizarre or disorganized behavior.   Confusion or disorientation.  DIAGNOSIS  A diagnosis is made by an evaluation of your symptoms. There are no medical or lab tests that lead to a diagnosis, but there are various questionnaires that a health care provider may use to identify those with the baby blues, postpartum depression, or psychosis. Often, a screening tool called the [place] Postnatal Depression Scale is used to diagnose depression in the postpartum period.  TREATMENT The baby blues usually goes away on its own in 1-2 weeks. Social support is often all that is needed. You will be encouraged to get adequate sleep and rest. Occasionally, you may be given medicines to help you sleep.  Postpartum depression requires treatment because it can last several months or longer if it is not treated. Treatment may include individual or group therapy, medicine, or both to address any social, physiological, and psychological factors  that may play a role in the depression. Regular exercise, a healthy diet, rest, and social support may also be strongly recommended.  Postpartum psychosis is more serious and needs treatment right away. Hospitalization is often needed. HOME CARE INSTRUCTIONS  Get as much rest as you can. Nap when the baby sleeps.   Exercise regularly. Some women find yoga and walking to be beneficial.   Eat a balanced and nourishing diet.   Do little things that you enjoy. Have a cup of tea, take a bubble bath, read your favorite magazine, or listen to your favorite music.  Avoid alcohol.   Ask for help with household chores, cooking, grocery shopping, or running errands as needed. Do not try to do everything.   Talk to people close to you about how you are feeling. Get support from your partner, family members, friends, or other new moms.  Try to stay positive in how you think. Think about the things you are grateful for.   Do not spend a lot of time alone.   Only take over-the-counter or prescription medicine as directed by your health care provider.  Keep all your postpartum appointments.   Let your health care provider know if you have any concerns.  SEEK MEDICAL CARE IF: You are having a reaction to or problems with your medicine. SEEK IMMEDIATE MEDICAL CARE IF:  You have suicidal feelings.   You think you may harm the baby or someone else. MAKE SURE YOU:  Understand these instructions.  Will watch your condition.  Will get help right away if you are not doing well or get worse.   This information is not intended to replace advice given to you by your health care provider. Make sure you discuss any questions you have with your health care provider.   Document Released: 06/05/2004 Document Revised: 09/06/2013 Document Reviewed: 06/13/2013 Elsevier Interactive Patient Education 2016 [employer]. Postpartum Care After Cesarean Delivery After you deliver your newborn  (postpartum period), the usual stay in the hospital is 24-72 hours. If there were problems with your labor or delivery, or if you have other medical problems, you might be in the hospital longer.  While you are in the hospital, you will receive help and instructions on how to care for yourself and your newborn during the postpartum period.  While you are in the hospital:  It is normal for you to have pain or discomfort from the incision in your  abdomen. Be sure to tell your nurses when you are having pain, where the pain is located, and what makes the pain worse.  If you are breastfeeding, you may feel uncomfortable contractions of your uterus for a couple of weeks. This is normal. The contractions help your uterus get back to normal size.  It is normal to have some bleeding after delivery.  For the first 1-3 days after delivery, the flow is red and the amount may be similar to a period.  It is common for the flow to start and stop.  In the first few days, you may pass some small clots. Let your nurses know if you begin to pass large clots or your flow increases.  Do not  flush blood clots down the toilet before having the nurse look at them.  During the next 3-10 days after delivery, your flow should become more watery and pink or brown-tinged in color.  Ten to fourteen days after delivery, your flow should be a small amount of yellowish-white discharge.  The amount of your flow will decrease over the first few weeks after delivery. Your flow may stop in 6-8 weeks. Most women have had their flow stop by 12 weeks after delivery.  You should change your sanitary pads frequently.  Wash your hands thoroughly with soap and water for at least 20 seconds after changing pads, using the toilet, or before holding or feeding your newborn.  Your intravenous (IV) tubing will be removed when you are drinking enough fluids.  The urine drainage tube (urinary catheter) that was inserted before delivery  may be removed within 6-8 hours after delivery or when feeling returns to your legs. You should feel like you need to empty your bladder within the first 6-8 hours after the catheter has been removed.  In case you become weak, lightheaded, or faint, call your nurse before you get out of bed for the first time and before you take a shower for the first time.  Within the first few days after delivery, your breasts may begin to feel tender and full. This is called engorgement. Breast tenderness usually goes away within 48-72 hours after engorgement occurs. You may also notice milk leaking from your breasts. If you are not breastfeeding, do not stimulate your breasts. Breast stimulation can make your breasts produce more milk.  Spending as much time as possible with your newborn is very important. During this time, you and your newborn can feel close and get to know each other. Having your newborn stay in your room (rooming in) will help to strengthen the bond with your newborn. It will give you time to get to know your newborn and become comfortable caring for your newborn.  Your hormones change after delivery. Sometimes the hormone changes can temporarily cause you to feel sad or tearful. These feelings should not last more than a few days. If these feelings last longer than that, you should talk to your caregiver.  If desired, talk to your caregiver about methods of family planning or contraception.  Talk to your caregiver about immunizations. Your caregiver may want you to have the following immunizations before leaving the hospital:  Tetanus, diphtheria, and pertussis (Tdap) or tetanus and diphtheria (Td) immunization. It is very important that you and your family (including grandparents) or others caring for your newborn are up-to-date with the Tdap or Td immunizations. The Tdap or Td immunization can help protect your newborn from getting ill.  Rubella immunization.  Varicella (chickenpox)  immunization.  Influenza immunization. You should receive this annual immunization if you did not receive the immunization during your pregnancy.   This information is not intended to replace advice given to you by your health care provider. Make sure you discuss any questions you have with your health care provider.   Document Released: 05/26/2012 Document Reviewed: 05/26/2012 Elsevier Interactive Patient Education 2016 [employer]. Breastfeeding and Mastitis Mastitis is inflammation of the breast tissue. It can occur in women who are breastfeeding. This can make breastfeeding painful. Mastitis will sometimes go away on its own. Your health care provider will help determine if treatment is needed. CAUSES Mastitis is often associated with a blocked milk (lactiferous) duct. This can happen when too much milk builds up in the breast. Causes of excess milk in the breast can include:  Poor latch-on. If your baby is not latched onto the breast properly, she or he may not empty your breast completely while breastfeeding.  Allowing too much time to pass between feedings.  Wearing a bra or other clothing that is too tight. This puts extra pressure on the lactiferous ducts so milk does not flow through them as it should. Mastitis can also be caused by a bacterial infection. Bacteria may enter the breast tissue through cuts or openings in the skin. In women who are breastfeeding, this may occur because of cracked or irritated skin. Cracks in the skin are often caused when your baby does not latch on properly to the breast. SIGNS AND SYMPTOMS  Swelling, redness, tenderness, and pain in an area of the breast.  Swelling of the glands under the arm on the same side.  Fever may or may not accompany mastitis. If an infection is allowed to progress, a collection of pus (abscess) may develop. DIAGNOSIS  Your health care provider can usually diagnose mastitis based on your symptoms and a physical exam.  Tests may be done to help confirm the diagnosis. These may include:  Removal of pus from the breast by applying pressure to the area. This pus can be examined in the lab to determine which bacteria are present. If an abscess has developed, the fluid in the abscess can be removed with a needle. This can also be used to confirm the diagnosis and determine the bacteria present. In most cases, pus will not be present.  Blood tests to determine if your body is fighting a bacterial infection.  Mammogram or ultrasound tests to rule out other problems or diseases. TREATMENT  Mastitis that occurs with breastfeeding will sometimes go away on its own. Your health care provider may choose to wait 24 hours after first seeing you to decide whether a prescription medicine is needed. If your symptoms are worse after 24 hours, your health care provider will likely prescribe an antibiotic medicine to treat the mastitis. He or she will determine which bacteria are most likely causing the infection and will then select an appropriate antibiotic medicine. This is sometimes changed based on the results of tests performed to identify the bacteria, or if there is no response to the antibiotic medicine selected. Antibiotic medicines are usually given by mouth. You may also be given medicine for pain. HOME CARE INSTRUCTIONS  Only take over-the-counter or prescription medicines for pain, fever, or discomfort as directed by your health care provider.  If your health care provider prescribed an antibiotic medicine, take the medicine as directed. Make sure you finish it even if you start to feel better.  Do not wear a  tight or underwire bra. Wear a soft, supportive bra.  Increase your fluid intake, especially if you have a fever.  Continue to empty the breast. Your health care provider can tell you whether this milk is safe for your infant or needs to be thrown out. You may be told to stop nursing until your health care  provider thinks it is safe for your baby. Use a breast pump if you are advised to stop nursing.  Keep your nipples clean and dry.  Empty the first breast completely before going to the other breast. If your baby is not emptying your breasts completely for some reason, use a breast pump to empty your breasts.  If you go back to work, pump your breasts while at work to stay in time with your nursing schedule.  Avoid allowing your breasts to become overly filled with milk (engorged). SEEK MEDICAL CARE IF:  You have pus-like discharge from the breast.  Your symptoms do not improve with the treatment prescribed by your health care provider within 2 days. SEEK IMMEDIATE MEDICAL CARE IF:  Your pain and swelling are getting worse.  You have pain that is not controlled with medicine.  You have a red line extending from the breast toward your armpit.  You have a fever or persistent symptoms for more than 2-3 days.  You have a fever and your symptoms suddenly get worse. MAKE SURE YOU:   Understand these instructions.  Will watch your condition.  Will get help right away if you are not doing well or get worse.   This information is not intended to replace advice given to you by your health care provider. Make sure you discuss any questions you have with your health care provider.   Document Released: 12/27/2004 Document Revised: 09/06/2013 Document Reviewed: 04/07/2013 Elsevier Interactive Patient Education [employer].  Breastfeeding Deciding to breastfeed is one of the best choices you can make for you and your baby. A change in hormones during pregnancy causes your breast tissue to grow and increases the number and size of your milk ducts. These hormones also allow proteins, sugars, and fats from your blood supply to make breast milk in your milk-producing glands. Hormones prevent breast milk from being released before your baby is born as well as prompt milk flow after birth.  Once breastfeeding has begun, thoughts of your baby, as well as his or her sucking or crying, can stimulate the release of milk from your milk-producing glands.  BENEFITS OF BREASTFEEDING For Your Baby  Your first milk (colostrum) helps your baby's digestive system function better.  There are antibodies in your milk that help your baby fight off infections.  Your baby has a lower incidence of asthma, allergies, and sudden infant death syndrome.  The nutrients in breast milk are better for your baby than infant formulas and are designed uniquely for your baby's needs.  Breast milk improves your baby's brain development.  Your baby is less likely to develop other conditions, such as childhood obesity, asthma, or type 2 diabetes mellitus. For You  Breastfeeding helps to create a very special bond between you and your baby.  Breastfeeding is convenient. Breast milk is always available at the correct temperature and costs nothing.  Breastfeeding helps to burn calories and helps you lose the weight gained during pregnancy.  Breastfeeding makes your uterus contract to its prepregnancy size faster and slows bleeding (lochia) after you give birth.   Breastfeeding helps to lower your risk of developing  type 2 diabetes mellitus, osteoporosis, and breast or ovarian cancer later in life. SIGNS THAT YOUR BABY IS HUNGRY Early Signs of Hunger  Increased alertness or activity.  Stretching.  Movement of the head from side to side.  Movement of the head and opening of the mouth when the corner of the mouth or cheek is stroked (rooting).  Increased sucking sounds, smacking lips, cooing, sighing, or squeaking.  Hand-to-mouth movements.  Increased sucking of fingers or hands. Late Signs of Hunger  Fussing.  Intermittent crying. Extreme Signs of Hunger Signs of extreme hunger will require calming and consoling before your baby will be able to breastfeed successfully. Do not wait for the  following signs of extreme hunger to occur before you initiate breastfeeding:  Restlessness.  A loud, strong cry.  Screaming. BREASTFEEDING BASICS Breastfeeding Initiation  Find a comfortable place to sit or lie down, with your neck and back well supported.  Place a pillow or rolled up blanket under your baby to bring him or her to the level of your breast (if you are seated). Nursing pillows are specially designed to help support your arms and your baby while you breastfeed.  Make sure that your baby's abdomen is facing your abdomen.  Gently massage your breast. With your fingertips, massage from your chest wall toward your nipple in a circular motion. This encourages milk flow. You may need to continue this action during the feeding if your milk flows slowly.  Support your breast with 4 fingers underneath and your thumb above your nipple. Make sure your fingers are well away from your nipple and your baby's mouth.  Stroke your baby's lips gently with your finger or nipple.  When your baby's mouth is open wide enough, quickly bring your baby to your breast, placing your entire nipple and as much of the colored area around your nipple (areola) as possible into your baby's mouth.  More areola should be visible above your baby's upper lip than below the lower lip.  Your baby's tongue should be between his or her lower gum and your breast.  Ensure that your baby's mouth is correctly positioned around your nipple (latched). Your baby's lips should create a seal on your breast and be turned out (everted).  It is common for your baby to suck about 2-3 minutes in order to start the flow of breast milk. Latching Teaching your baby how to latch on to your breast properly is very important. An improper latch can cause nipple pain and decreased milk supply for you and poor weight gain in your baby. Also, if your baby is not latched onto your nipple properly, he or she may swallow some air during  feeding. This can make your baby fussy. Burping your baby when you switch breasts during the feeding can help to get rid of the air. However, teaching your baby to latch on properly is still the best way to prevent fussiness from swallowing air while breastfeeding. Signs that your baby has successfully latched on to your nipple:  Silent tugging or silent sucking, without causing you pain.  Swallowing heard between every 3-4 sucks.  Muscle movement above and in front of his or her ears while sucking. Signs that your baby has not successfully latched on to nipple:  Sucking sounds or smacking sounds from your baby while breastfeeding.  Nipple pain. If you think your baby has not latched on correctly, slip your finger into the corner of your baby's mouth to break the suction and place  it between your baby's gums. Attempt breastfeeding initiation again. Signs of Successful Breastfeeding Signs from your baby:  A gradual decrease in the number of sucks or complete cessation of sucking.  Falling asleep.  Relaxation of his or her body.  Retention of a small amount of milk in his or her mouth.  Letting go of your breast by himself or herself. Signs from you:  Breasts that have increased in firmness, weight, and size 1-3 hours after feeding.  Breasts that are softer immediately after breastfeeding.  Increased milk volume, as well as a change in milk consistency and color by the fifth day of breastfeeding.  Nipples that are not sore, cracked, or bleeding. Signs That Your [employer] is Getting Enough Milk  Wetting at least 3 diapers in a 24-hour period. The urine should be clear and pale yellow by age 21 days.  At least 3 stools in a 24-hour period by age 21 days. The stool should be soft and yellow.  At least 3 stools in a 24-hour period by age 73 days. The stool should be seedy and yellow.  No loss of weight greater than 10% of birth weight during the first 30 days of age.  Average weight  gain of 4-7 ounces (113-198 g) per week after age 12 days.  Consistent daily weight gain by age 20 days, without weight loss after the age of 2 weeks. After a feeding, your baby may spit up a small amount. This is common. BREASTFEEDING FREQUENCY AND DURATION Frequent feeding will help you make more milk and can prevent sore nipples and breast engorgement. Breastfeed when you feel the need to reduce the fullness of your breasts or when your baby shows signs of hunger. This is called "breastfeeding on demand." Avoid introducing a pacifier to your baby while you are working to establish breastfeeding (the first 4-6 weeks after your baby is born). After this time you may choose to use a pacifier. Research has shown that pacifier use during the first year of a baby's life decreases the risk of sudden infant death syndrome (SIDS). Allow your baby to feed on each breast as long as he or she wants. Breastfeed until your baby is finished feeding. When your baby unlatches or falls asleep while feeding from the first breast, offer the second breast. Because newborns are often sleepy in the first few weeks of life, you may need to awaken your baby to get him or her to feed. Breastfeeding times will vary from baby to baby. However, the following rules can serve as a guide to help you ensure that your baby is properly fed:  Newborns (babies 72 weeks of age or younger) may breastfeed every 1-3 hours.  Newborns should not go longer than 3 hours during the day or 5 hours during the night without breastfeeding.  You should breastfeed your baby a minimum of 8 times in a 24-hour period until you begin to introduce solid foods to your baby at around 69 months of age. BREAST MILK PUMPING Pumping and storing breast milk allows you to ensure that your baby is exclusively fed your breast milk, even at times when you are unable to breastfeed. This is especially important if you are going back to work while you are still  breastfeeding or when you are not able to be present during feedings. Your lactation consultant can give you guidelines on how long it is safe to store breast milk. A breast pump is a machine that allows you to pump  milk from your breast into a sterile bottle. The pumped breast milk can then be stored in a refrigerator or freezer. Some breast pumps are operated by hand, while others use electricity. Ask your lactation consultant which type will work best for you. Breast pumps can be purchased, but some hospitals and breastfeeding support groups lease breast pumps on a monthly basis. A lactation consultant can teach you how to hand express breast milk, if you prefer not to use a pump. CARING FOR YOUR BREASTS WHILE YOU BREASTFEED Nipples can become dry, cracked, and sore while breastfeeding. The following recommendations can help keep your breasts moisturized and healthy:  Avoid using soap on your nipples.  Wear a supportive bra. Although not required, special nursing bras and tank tops are designed to allow access to your breasts for breastfeeding without taking off your entire bra or top. Avoid wearing underwire-style bras or extremely tight bras.  Air dry your nipples for 3-58mnutes after each feeding.  Use only cotton bra pads to absorb leaked breast milk. Leaking of breast milk between feedings is normal.  Use lanolin on your nipples after breastfeeding. Lanolin helps to maintain your skin's normal moisture barrier. If you use pure lanolin, you do not need to wash it off before feeding your baby again. Pure lanolin is not toxic to your baby. You may also hand express a few drops of breast milk and gently massage that milk into your nipples and allow the milk to air dry. In the first few weeks after giving birth, some women experience extremely full breasts (engorgement). Engorgement can make your breasts feel heavy, warm, and tender to the touch. Engorgement peaks within 3-5 days after you give  birth. The following recommendations can help ease engorgement:  Completely empty your breasts while breastfeeding or pumping. You may want to start by applying warm, moist heat (in the shower or with warm water-soaked hand towels) just before feeding or pumping. This increases circulation and helps the milk flow. If your baby does not completely empty your breasts while breastfeeding, pump any extra milk after he or she is finished.  Wear a snug bra (nursing or regular) or tank top for 1-2 days to signal your body to slightly decrease milk production.  Apply ice packs to your breasts, unless this is too uncomfortable for you.  Make sure that your baby is latched on and positioned properly while breastfeeding. If engorgement persists after 48 hours of following these recommendations, contact your health care provider or a lScience writer OVERALL HEALTH CARE RECOMMENDATIONS WHILE BREASTFEEDING  Eat healthy foods. Alternate between meals and snacks, eating 3 of each per day. Because what you eat affects your breast milk, some of the foods may make your baby more irritable than usual. Avoid eating these foods if you are sure that they are negatively affecting your baby.  Drink milk, fruit juice, and water to satisfy your thirst (about 10 glasses a day).  Rest often, relax, and continue to take your prenatal vitamins to prevent fatigue, stress, and anemia.  Continue breast self-awareness checks.  Avoid chewing and smoking tobacco. Chemicals from cigarettes that pass into breast milk and exposure to secondhand smoke may harm your baby.  Avoid alcohol and drug use, including marijuana. Some medicines that may be harmful to your baby can pass through breast milk. It is important to ask your health care provider before taking any medicine, including all over-the-counter and prescription medicine as well as vitamin and herbal supplements. It is possible to  become pregnant while breastfeeding. If  birth control is desired, ask your health care provider about options that will be safe for your baby. SEEK MEDICAL CARE IF:  You feel like you want to stop breastfeeding or have become frustrated with breastfeeding.  You have painful breasts or nipples.  Your nipples are cracked or bleeding.  Your breasts are red, tender, or warm.  You have a swollen area on either breast.  You have a fever or chills.  You have nausea or vomiting.  You have drainage other than breast milk from your nipples.  Your breasts do not become full before feedings by the fifth day after you give birth.  You feel sad and depressed.  Your baby is too sleepy to eat well.  Your baby is having trouble sleeping.   Your baby is wetting less than 3 diapers in a 24-hour period.  Your baby has less than 3 stools in a 24-hour period.  Your baby's skin or the white part of his or her eyes becomes yellow.   Your baby is not gaining weight by 24 days of age. SEEK IMMEDIATE MEDICAL CARE IF:  Your baby is overly tired (lethargic) and does not want to wake up and feed.  Your baby develops an unexplained fever.   This information is not intended to replace advice given to you by your health care provider. Make sure you discuss any questions you have with your health care provider.   Document Released: 09/01/2005 Document Revised: 05/23/2015 Document Reviewed: 02/23/2013 Elsevier Interactive Patient Education [employer].

## 2016-05-25 NOTE — Progress Notes (Signed)
Patient ID: Mia Parker, female   DOB: 03/12/1971, 45 y.o.   MRN: [id number] Subjective: S/P Elective Repeat Cesarean Delivery / BTL POD# 2 Information for the patient's newborn:  Mia, Parker Girl Mia [[id number]]  female   Reports feeling well. Ready for d/c home today. Feeding: breast Patient reports tolerating PO.  Breast symptoms: none Pain minimally controlled with ibuprofen (OTC) and narcotic analgesics including Percocet 2 tabs every 4 hours. Denies HA/SOB/C/P/N/V/dizziness. Flatus present. (+) BM. She reports vaginal bleeding as normal, without clots.  She is ambulating, urinating without difficult.     Objective:   VS:  Vitals:   05/24/16 1100 05/24/16 1825 05/24/16 1902 05/25/16 0625  BP: (!) 119/56 (!) 146/84 (!) 119/58 (!) 143/83  Pulse: 86 (!) 111  90  Resp: 18 18  17   Temp: 98.3 F (36.8 C) 97.3 F (36.3 C)  98.1 F (36.7 C)  TempSrc: Oral Oral  Oral  SpO2: 96%     Weight:      Height:      BP prior to Labetalol administration: 129/70    Intake/Output Summary (Last 24 hours) at 05/25/16 1252 Last data filed at 05/24/16 1700  Gross per 24 hour  Intake                0 ml  Output              800 ml  Net             -800 ml        Recent Labs  05/23/16 1411 05/24/16 0529  WBC 13.3* 10.7*  HGB 14.0 11.7*  HCT 39.8 34.4*  PLT 323 238     Blood type: --/--/B POS (09/08 1411)     Physical Exam:   General: alert, cooperative, no distress and morbidly obese  CV: Regular rate and rhythm, S1S2 present or without murmur or extra heart sounds  Resp: clear  Abdomen: soft, nontender, normal bowel sounds  Incision: clean, dry, intact and skin well-approximated with sutures  Uterine Fundus: firm, 2 FB below umbilicus, nontender  Lochia: minimal  Ext: edema 2+ and Homans sign is negative, no sign of DVT   Assessment/Plan: 45 y.o.   POD# 2.  S/P Cesarean Delivery.  Indications: elective repeat / BTL                Principal Problem:   Postpartum care  following RC/S & BTL (9/8) Active Problems:   Gestational hypertension   Preeclampsia Sleep Apnea   Doing well, stable.               Regular diet as tolerated Ambulate Routine post-op care Start Labetalol 100 mg BID for BP >140/>90 - hold d/t BP WNL  D/C home after 5pm  *Dr. Ronita Hipps consulted concerning labile BPs / ok to Rx HCTZ 25 mg x 7 days - agrees  Mia Congress, MSN, CNM 05/25/2016, 12:45 PM

## 2016-05-25 NOTE — Progress Notes (Signed)
Rolitta Dawson aware of BP of 129/70 and that we held Labetalol dose at this time. Will  Monitor patient until 5pm per Sunday Corn.

## 2016-05-27 ENCOUNTER — Encounter (HOSPITAL_COMMUNITY): Payer: Self-pay | Admitting: Obstetrics and Gynecology

## 2016-07-02 ENCOUNTER — Telehealth: Payer: Self-pay

## 2016-07-02 NOTE — Telephone Encounter (Signed)
Pt states that she needs a refill of Adderall, she gave birth 6 weeks ago and is ready to start back on this. She is trying to breastfeed, not sure if Adderall will cross the breastmilk. She is having issues with latching and pumping- she is not sure she wants to keep trying to breastfeed. Her number is [phone].

## 2016-07-03 ENCOUNTER — Telehealth: Payer: Self-pay | Admitting: Family Medicine

## 2016-07-03 MED ORDER — AMPHETAMINE-DEXTROAMPHET ER 25 MG PO CP24: 25.0000 mg | ORAL_CAPSULE | Freq: Two times a day (BID) | ORAL | 0 refills | Status: DC

## 2016-07-03 NOTE — Telephone Encounter (Signed)
Called and informed pt that rx was ready to be picked up

## 2016-07-03 NOTE — Telephone Encounter (Signed)
She would like to start back on her Adderall. Review of the literature indicates that high levels can occur in the breast milk and cause symptoms with the child. I discussed this with her and recommend that she either breast-feed or pump her breasts prior to taking the next dose of the Adderall and be aware of possible irritability and jitteriness and her child. She was comfortable with this. She is not sure how long she will continue to breast-feed.

## 2016-07-04 LAB — RESULTS CONSOLE HPV: CHL HPV: NEGATIVE

## 2016-07-04 LAB — HM PAP SMEAR

## 2016-07-10 ENCOUNTER — Telehealth: Payer: Self-pay | Admitting: Family Medicine

## 2016-07-10 NOTE — Telephone Encounter (Signed)
Pt states while in the office that it was time for her P.A. For Adderall.  I looked at last approval and it was approved til 07/25/18.  Called pt & informed & she will let me know if she recv's notice of expiration of P.A.

## 2016-08-25 ENCOUNTER — Ambulatory Visit (INDEPENDENT_AMBULATORY_CARE_PROVIDER_SITE_OTHER): Payer: 59 | Admitting: Family Medicine

## 2016-08-25 ENCOUNTER — Encounter: Payer: Self-pay | Admitting: Family Medicine

## 2016-08-25 VITALS — BP 120/80 | HR 66 | Ht 69.0 in | Wt 286.0 lb

## 2016-08-25 DIAGNOSIS — F900 Attention-deficit hyperactivity disorder, predominantly inattentive type: Secondary | ICD-10-CM

## 2016-08-25 DIAGNOSIS — F53 Postpartum depression: Secondary | ICD-10-CM

## 2016-08-25 DIAGNOSIS — O99345 Other mental disorders complicating the puerperium: Principal | ICD-10-CM

## 2016-08-25 MED ORDER — AMPHETAMINE-DEXTROAMPHETAMINE 20 MG PO TABS: 20.0000 mg | ORAL_TABLET | Freq: Two times a day (BID) | ORAL | 0 refills | Status: DC

## 2016-08-25 MED ORDER — CITALOPRAM HYDROBROMIDE 20 MG PO TABS: 20.0000 mg | ORAL_TABLET | Freq: Every day | ORAL | 3 refills | Status: DC

## 2016-08-25 NOTE — Progress Notes (Signed)
   Subjective:    Patient ID: Mia Parker, female    DOB: 1970/10/07, 45 y.o.   MRN: [id number]  HPI She is here for medication management visit. She has not been taking her Adderall as she is now breast-feeding her 45-monthold daughter. She would like to avoid this as much as possible and would like to therefore switched to a shorter acting medication to have less evidence of breast milk. She also has been having difficulty since delivery with being quite angry and crying. She did not have this trouble with the first pregnancy. She is taking this out on her husband.   Review of Systems     Objective:   Physical Exam Alert and tearful. Appropriately dressed.       Assessment & Plan:  Postpartum depression - Plan: citalopram (CELEXA) 20 MG tablet  Attention deficit hyperactivity disorder (ADHD), predominantly inattentive type - Plan: amphetamine-dextroamphetamine (ADDERALL) 20 MG tablet  I will give her the 20 mg Adderall. She will let me know how well this works. We will adjust accordingly. I to eliminate the Adderall around the time of her breast-feeding. She will return here in about one month for recheck.

## 2016-08-27 ENCOUNTER — Telehealth: Payer: Self-pay

## 2016-08-27 NOTE — Telephone Encounter (Signed)
LMTCB

## 2016-08-27 NOTE — Telephone Encounter (Signed)
Pt returned call to office- she states you only wrote the one script of the 20 d/t f/u in 1 month since you changed dose.

## 2016-08-27 NOTE — Telephone Encounter (Signed)
Tell her to come by and pick it up in the morning

## 2016-08-27 NOTE — Telephone Encounter (Signed)
Find out if it was just one prescription or all of them that got ruined and let me know so I can write the correct ones

## 2016-08-27 NOTE — Telephone Encounter (Signed)
Pt states that she tried to pick up adderall script but something spilled on it in her diaper bag and pharmacy would not take it. Pt states she can bring back the original script if you can re-write script for her.

## 2016-08-28 NOTE — Telephone Encounter (Signed)
Pt notified that scripts will be ready for pick up.

## 2016-09-24 ENCOUNTER — Encounter: Payer: Self-pay | Admitting: Family Medicine

## 2016-09-24 ENCOUNTER — Ambulatory Visit (INDEPENDENT_AMBULATORY_CARE_PROVIDER_SITE_OTHER): Payer: 59 | Admitting: Family Medicine

## 2016-09-24 VITALS — BP 110/60 | HR 77 | Wt 272.0 lb

## 2016-09-24 DIAGNOSIS — F900 Attention-deficit hyperactivity disorder, predominantly inattentive type: Secondary | ICD-10-CM | POA: Diagnosis not present

## 2016-09-24 DIAGNOSIS — F53 Postpartum depression: Secondary | ICD-10-CM

## 2016-09-24 DIAGNOSIS — O99345 Other mental disorders complicating the puerperium: Principal | ICD-10-CM

## 2016-09-24 MED ORDER — CITALOPRAM HYDROBROMIDE 40 MG PO TABS: 40.0000 mg | ORAL_TABLET | Freq: Every day | ORAL | 3 refills | Status: DC

## 2016-09-24 MED ORDER — AMPHETAMINE-DEXTROAMPHETAMINE 20 MG PO TABS: 20.0000 mg | ORAL_TABLET | Freq: Three times a day (TID) | ORAL | 0 refills | Status: DC

## 2016-09-24 NOTE — Progress Notes (Signed)
   Subjective:    Patient ID: Mia Parker, female    DOB: [birth year], 46 y.o.   MRN: [id number]  HPI She is here for follow-up visit. Presently she is taking Celexa 20 mg and notes a huge improvement in her overall psychological health and well-being. She thinks she has essentially back to normal. She would also like to take her Adderall on a 3 times per day dosing. She is trying to work this around breast-feeding her child who is now 60 months old. She will try to reach 6 months before possibly switching to a different formulation.   Review of Systems     Objective:   Physical Exam Alert and in no distress with appropriate affect       Assessment & Plan:  Postpartum depression - Plan: citalopram (CELEXA) 40 MG tablet  Attention deficit hyperactivity disorder (ADHD), predominantly inattentive type - Plan: amphetamine-dextroamphetamine (ADDERALL) 20 MG tablet, amphetamine-dextroamphetamine (ADDERALL) 20 MG tablet, amphetamine-dextroamphetamine (ADDERALL) 20 MG tablet  I will increase her to 40 mg to see if that makes a difference. She will call me in one month and let me know. Discussed likelihood of keeping her on 1 dosing or the other for at least 6 months.

## 2016-09-28 DIAGNOSIS — G4733 Obstructive sleep apnea (adult) (pediatric): Secondary | ICD-10-CM | POA: Diagnosis not present

## 2016-10-29 DIAGNOSIS — G4733 Obstructive sleep apnea (adult) (pediatric): Secondary | ICD-10-CM | POA: Diagnosis not present

## 2016-11-26 DIAGNOSIS — G4733 Obstructive sleep apnea (adult) (pediatric): Secondary | ICD-10-CM | POA: Diagnosis not present

## 2016-12-16 ENCOUNTER — Telehealth: Payer: Self-pay | Admitting: Family Medicine

## 2016-12-16 DIAGNOSIS — F900 Attention-deficit hyperactivity disorder, predominantly inattentive type: Secondary | ICD-10-CM

## 2016-12-16 MED ORDER — AMPHETAMINE-DEXTROAMPHETAMINE 20 MG PO TABS: 20.0000 mg | ORAL_TABLET | Freq: Three times a day (TID) | ORAL | 0 refills | Status: DC

## 2016-12-16 MED ORDER — AMPHETAMINE-DEXTROAMPHETAMINE 20 MG PO TABS
20.0000 mg | ORAL_TABLET | Freq: Three times a day (TID) | ORAL | 0 refills | Status: DC
Start: 2016-12-23 — End: 2017-03-16

## 2016-12-16 NOTE — Telephone Encounter (Signed)
Pt called for refills of adderall. Please call pt at [phone].

## 2016-12-18 ENCOUNTER — Telehealth: Payer: Self-pay | Admitting: Family Medicine

## 2016-12-18 NOTE — Telephone Encounter (Signed)
Call pt to inform rx is ready. Unable to leave message, mail box full.

## 2017-01-20 ENCOUNTER — Other Ambulatory Visit: Payer: Self-pay | Admitting: Family Medicine

## 2017-01-20 DIAGNOSIS — O99345 Other mental disorders complicating the puerperium: Principal | ICD-10-CM

## 2017-01-20 DIAGNOSIS — F53 Postpartum depression: Secondary | ICD-10-CM

## 2017-01-20 NOTE — Telephone Encounter (Signed)
Is this okay to refill? 

## 2017-03-16 ENCOUNTER — Telehealth: Payer: Self-pay | Admitting: Family Medicine

## 2017-03-16 DIAGNOSIS — F900 Attention-deficit hyperactivity disorder, predominantly inattentive type: Secondary | ICD-10-CM

## 2017-03-16 MED ORDER — AMPHETAMINE-DEXTROAMPHETAMINE 20 MG PO TABS: 20.0000 mg | ORAL_TABLET | Freq: Three times a day (TID) | ORAL | 0 refills | Status: DC

## 2017-03-16 NOTE — Telephone Encounter (Signed)
Pt called for 3 month supply of Addderall refill.   Pt ph [phone]

## 2017-03-17 ENCOUNTER — Telehealth: Payer: Self-pay | Admitting: Family Medicine

## 2017-03-17 NOTE — Telephone Encounter (Signed)
Left message advising pt rx is ready.

## 2017-06-23 ENCOUNTER — Telehealth: Payer: Self-pay | Admitting: Family Medicine

## 2017-06-23 DIAGNOSIS — F900 Attention-deficit hyperactivity disorder, predominantly inattentive type: Secondary | ICD-10-CM

## 2017-06-23 MED ORDER — AMPHETAMINE-DEXTROAMPHETAMINE 20 MG PO TABS: 20.0000 mg | ORAL_TABLET | Freq: Three times a day (TID) | ORAL | 0 refills | Status: DC

## 2017-06-23 NOTE — Telephone Encounter (Signed)
Pt needs refill on Adderall, please call when ready

## 2017-06-24 ENCOUNTER — Telehealth: Payer: Self-pay | Admitting: Family Medicine

## 2017-06-24 NOTE — Telephone Encounter (Signed)
Left message for pt that rx was ready for pick up

## 2017-07-17 ENCOUNTER — Other Ambulatory Visit: Payer: Self-pay | Admitting: Family Medicine

## 2017-07-17 DIAGNOSIS — F53 Postpartum depression: Secondary | ICD-10-CM

## 2017-07-17 DIAGNOSIS — O99345 Other mental disorders complicating the puerperium: Principal | ICD-10-CM

## 2017-07-17 NOTE — Telephone Encounter (Signed)
I will call this and did have her set up an appointment

## 2017-07-17 NOTE — Telephone Encounter (Signed)
Is this okay to refill? Last appt was January 2018

## 2017-07-17 NOTE — Telephone Encounter (Signed)
Tried to call pt but VM is full. Pt is due for an appt within 30 days

## 2017-09-21 ENCOUNTER — Telehealth: Payer: Self-pay | Admitting: Family Medicine

## 2017-09-21 NOTE — Telephone Encounter (Signed)
[facility] NEW PHARMACY   Pt called for refills of Adderall. Please send to new pharmacy [facility]. Pt can be reached at [phone].

## 2017-09-21 NOTE — Telephone Encounter (Signed)
She needs an appointment but I will call in a prescription if need be

## 2017-09-21 NOTE — Telephone Encounter (Signed)
LMTCB

## 2017-09-24 ENCOUNTER — Telehealth: Payer: Self-pay | Admitting: Family Medicine

## 2017-09-24 DIAGNOSIS — F900 Attention-deficit hyperactivity disorder, predominantly inattentive type: Secondary | ICD-10-CM

## 2017-09-24 MED ORDER — AMPHETAMINE-DEXTROAMPHETAMINE 20 MG PO TABS: 20.0000 mg | ORAL_TABLET | Freq: Three times a day (TID) | ORAL | 0 refills | Status: DC

## 2017-09-24 NOTE — Telephone Encounter (Signed)
Pt would like for Adderall 20 mg to sent [facility] [place] Pt states that [facility] [address] [facility] not [facility] Adderall 74m in stock and the only [facility] in our area that [facility] it in [facility] [address]

## 2017-09-24 NOTE — Telephone Encounter (Signed)
Pt come in and made a appt for a medcheck on  Jan the 18th and would like a 1 month rx for her adderall [facility] [address], [place], [place] [address]  pt can be reached at [phone]

## 2017-10-02 ENCOUNTER — Encounter: Payer: Self-pay | Admitting: Family Medicine

## 2017-10-02 ENCOUNTER — Ambulatory Visit (INDEPENDENT_AMBULATORY_CARE_PROVIDER_SITE_OTHER): Payer: 59 | Admitting: Family Medicine

## 2017-10-02 DIAGNOSIS — F320 Major depressive disorder, single episode, mild: Secondary | ICD-10-CM | POA: Diagnosis not present

## 2017-10-02 DIAGNOSIS — F900 Attention-deficit hyperactivity disorder, predominantly inattentive type: Secondary | ICD-10-CM | POA: Diagnosis not present

## 2017-10-02 DIAGNOSIS — R7301 Impaired fasting glucose: Secondary | ICD-10-CM | POA: Diagnosis not present

## 2017-10-02 MED ORDER — CITALOPRAM HYDROBROMIDE 40 MG PO TABS: 40.0000 mg | ORAL_TABLET | Freq: Every day | ORAL | 3 refills | Status: DC

## 2017-10-02 MED ORDER — AMPHETAMINE-DEXTROAMPHETAMINE 20 MG PO TABS: 20.0000 mg | ORAL_TABLET | Freq: Three times a day (TID) | ORAL | 0 refills | Status: DC

## 2017-10-02 NOTE — Progress Notes (Signed)
   Subjective:    Patient ID: Mia Parker, female    DOB: 1971-03-31, 47 y.o.   MRN: [id number]  HPI She is here for an interval evaluation.  She did have difficulty with postpartum depression and did stay on medicine for several months but stopped the summer.  About 2 months after she stopped she started having more difficulty with anxiety and mood swings and put herself back on Celexa.  She is doing quite nicely on the medication and feels that it has made her back to normal.  She continues have difficulty with weight gain. She states that she has done about as much as she can do to help with her weight loss. She also continues on Adderall.  She does take it 3 times per day.  It lasts 3 or 4 hours and she gets good relief with this. She has 2 children and has had a tubal ligation.   Review of Systems     Objective:   Physical Exam Alert and in no distress.  DTRs are normal she is here for an interval evaluation.  Tympanic membranes and canals are normal. Pharyngeal area is normal. Neck is supple without adenopathy or thyromegaly. Cardiac exam shows a regular sinus rhythm without murmurs or gallops. Lungs are clear to auscultation.        Assessment & Plan:  Morbid obesity due to excess calories ([place]) - Plan: CBC with Differential/Platelet, Comprehensive metabolic panel, Lipid panel, TSH  Attention deficit hyperactivity disorder (ADHD), predominantly inattentive type - Plan: amphetamine-dextroamphetamine (ADDERALL) 20 MG tablet, amphetamine-dextroamphetamine (ADDERALL) 20 MG tablet  Depression, major, single episode, mild ([facility]) - Plan: citalopram (CELEXA) 40 MG tablet I discussed her obesity with her and will refer her to [employer] for research study for weight loss. Continue on her present ADD meds as they are working quite well. I discussed the treatment of depression.  Explained that I would like to keep her on for at least one full year and we will discuss this again next year  probably stopping this sometime in the spring.  She was comfortable with that.

## 2017-10-03 LAB — COMPREHENSIVE METABOLIC PANEL
ALBUMIN: 4.3 g/dL (ref 3.5–5.5)
ALK PHOS: 119 IU/L — AB (ref 39–117)
ALT: 13 IU/L (ref 0–32)
AST: 16 IU/L (ref 0–40)
Albumin/Globulin Ratio: 1.3 (ref 1.2–2.2)
BUN / CREAT RATIO: 12 (ref 9–23)
BUN: 8 mg/dL (ref 6–24)
Bilirubin Total: <0.2 mg/dL (ref 0.0–1.2)
CO2: 20 mmol/L (ref 20–29)
Calcium: 9.3 mg/dL (ref 8.7–10.2)
Chloride: 99 mmol/L (ref 96–106)
Creatinine, Ser: 0.65 mg/dL (ref 0.57–1.00)
GFR calc Af Amer: 125 mL/min/{1.73_m2} (ref >59)
GFR, EST NON AFRICAN AMERICAN: 108 mL/min/{1.73_m2} (ref >59)
GLOBULIN, TOTAL: 3.4 g/dL (ref 1.5–4.5)
GLUCOSE: 145 mg/dL — AB (ref 65–99)
Potassium: 3.9 mmol/L (ref 3.5–5.2)
SODIUM: 137 mmol/L (ref 134–144)
TOTAL PROTEIN: 7.7 g/dL (ref 6.0–8.5)

## 2017-10-03 LAB — CBC WITH DIFFERENTIAL/PLATELET
BASOS ABS: 0.2 10*3/uL (ref 0.0–0.2)
Basos: 2 %
EOS (ABSOLUTE): 1.1 10*3/uL — ABNORMAL HIGH (ref 0.0–0.4)
EOS: 9 %
HEMATOCRIT: 40.7 % (ref 34.0–46.6)
HEMOGLOBIN: 14.1 g/dL (ref 11.1–15.9)
IMMATURE GRANS (ABS): 0 10*3/uL (ref 0.0–0.1)
IMMATURE GRANULOCYTES: 0 %
LYMPHS ABS: 2.9 10*3/uL (ref 0.7–3.1)
LYMPHS: 25 %
MCH: 28.7 pg (ref 26.6–33.0)
MCHC: 34.6 g/dL (ref 31.5–35.7)
MCV: 83 fL (ref 79–97)
MONOCYTES: 7 %
Monocytes Absolute: 0.8 10*3/uL (ref 0.1–0.9)
NEUTROS PCT: 57 %
Neutrophils Absolute: 6.6 10*3/uL (ref 1.4–7.0)
Platelets: 415 10*3/uL — ABNORMAL HIGH (ref 150–379)
RBC: 4.91 x10E6/uL (ref 3.77–5.28)
RDW: 14.7 % (ref 12.3–15.4)
WBC: 11.6 10*3/uL — AB (ref 3.4–10.8)

## 2017-10-03 LAB — LIPID PANEL
CHOL/HDL RATIO: 2.8 ratio (ref 0.0–4.4)
CHOLESTEROL TOTAL: 198 mg/dL (ref 100–199)
HDL: 70 mg/dL (ref >39)
LDL Calculated: 100 mg/dL — ABNORMAL HIGH (ref 0–99)
TRIGLYCERIDES: 140 mg/dL (ref 0–149)
VLDL CHOLESTEROL CAL: 28 mg/dL (ref 5–40)

## 2017-10-03 LAB — TSH: TSH: 3.87 u[IU]/mL (ref 0.450–4.500)

## 2017-10-06 LAB — HGB A1C W/O EAG: Hgb A1c MFr Bld: 6 % — ABNORMAL HIGH (ref 4.8–5.6)

## 2017-10-06 LAB — SPECIMEN STATUS REPORT

## 2017-11-27 DIAGNOSIS — G4733 Obstructive sleep apnea (adult) (pediatric): Secondary | ICD-10-CM | POA: Diagnosis not present

## 2017-11-28 DIAGNOSIS — B829 Intestinal parasitism, unspecified: Secondary | ICD-10-CM | POA: Diagnosis not present

## 2017-11-29 DIAGNOSIS — B829 Intestinal parasitism, unspecified: Secondary | ICD-10-CM | POA: Diagnosis not present

## 2017-11-30 DIAGNOSIS — B829 Intestinal parasitism, unspecified: Secondary | ICD-10-CM | POA: Diagnosis not present

## 2017-11-30 DIAGNOSIS — E8881 Metabolic syndrome: Secondary | ICD-10-CM | POA: Diagnosis not present

## 2017-12-01 ENCOUNTER — Telehealth: Payer: Self-pay | Admitting: Family Medicine

## 2017-12-01 NOTE — Telephone Encounter (Signed)
Pt dropped off lab work that she had done and would like for you to go over it and see if you see anything on it put in your folder pt can be reached at [phone]

## 2018-01-14 ENCOUNTER — Telehealth: Payer: Self-pay

## 2018-01-14 ENCOUNTER — Ambulatory Visit (INDEPENDENT_AMBULATORY_CARE_PROVIDER_SITE_OTHER): Payer: 59 | Admitting: Family Medicine

## 2018-01-14 ENCOUNTER — Encounter: Payer: Self-pay | Admitting: Family Medicine

## 2018-01-14 ENCOUNTER — Ambulatory Visit
Admission: RE | Admit: 2018-01-14 | Discharge: 2018-01-14 | Disposition: A | Discharge status: Home / Self Care | Payer: 59 | Source: Ambulatory Visit | Attending: Family Medicine | Admitting: Family Medicine

## 2018-01-14 VITALS — BP 140/86 | HR 80 | Temp 98.2°F | Ht 68.0 in | Wt 290.6 lb

## 2018-01-14 DIAGNOSIS — M25511 Pain in right shoulder: Secondary | ICD-10-CM | POA: Diagnosis not present

## 2018-01-14 DIAGNOSIS — S4991XA Unspecified injury of right shoulder and upper arm, initial encounter: Secondary | ICD-10-CM | POA: Diagnosis not present

## 2018-01-14 NOTE — Telephone Encounter (Signed)
Please advise of x ray results when read. Thanks [employer]

## 2018-01-14 NOTE — Progress Notes (Signed)
   Subjective:    Patient ID: Mia Parker, female    DOB: 1971/02/20, 47 y.o.   MRN: [id number]  HPI She injured her right shoulder in a fall at home on Tuesday.  She apparently slipped over something while carrying her child.  She does not remember the exact mechanism of the injury.  She complains of pain mainly in the anterior shoulder area.  She has been using ibuprofen.   Review of Systems     Objective:   Physical Exam Alert and in no distress.  No ecchymosis noted.  Tender to palpation over the [facility] joint.  Abduction of the shoulder does cause some discomfort.  Internal and external rotation of the shoulder abducted close to the torso causes no discomfort.  Negative sulcus sign.       Assessment & Plan:  Acute pain of right shoulder - Plan: DG Shoulder Right The x-ray is negative.  I will treat her conservatively and have her return here if continued difficulty.  Recommend 2 Aleve twice per day.

## 2018-01-14 NOTE — Telephone Encounter (Signed)
Pt called wanting her Xray results.  Please call her with results.   Thank you

## 2018-01-15 ENCOUNTER — Telehealth: Payer: Self-pay

## 2018-01-15 NOTE — Telephone Encounter (Signed)
Patient called wanting her Xray results.  I advised the patient that they were negative and to use anti inflammatory (2aleve) for a week.

## 2018-01-19 ENCOUNTER — Encounter: Payer: Self-pay | Admitting: Family Medicine

## 2018-01-20 ENCOUNTER — Telehealth: Payer: Self-pay

## 2018-01-20 DIAGNOSIS — F900 Attention-deficit hyperactivity disorder, predominantly inattentive type: Secondary | ICD-10-CM

## 2018-01-20 MED ORDER — AMPHETAMINE-DEXTROAMPHETAMINE 20 MG PO TABS: 20.0000 mg | ORAL_TABLET | Freq: Three times a day (TID) | ORAL | 0 refills | Status: DC

## 2018-01-20 NOTE — Telephone Encounter (Signed)
Pt called requesting a refill on her [facility]derall [facility] [address] . Please advise [facility]

## 2018-02-11 ENCOUNTER — Encounter: Payer: Self-pay | Admitting: Family Medicine

## 2018-02-11 ENCOUNTER — Ambulatory Visit
Admission: RE | Admit: 2018-02-11 | Discharge: 2018-02-11 | Disposition: A | Discharge status: Home / Self Care | Payer: 59 | Source: Ambulatory Visit | Attending: Family Medicine | Admitting: Family Medicine

## 2018-02-11 ENCOUNTER — Ambulatory Visit (INDEPENDENT_AMBULATORY_CARE_PROVIDER_SITE_OTHER): Payer: 59 | Admitting: Family Medicine

## 2018-02-11 VITALS — BP 122/76 | HR 97 | Temp 97.7°F | Ht 68.0 in | Wt 291.4 lb

## 2018-02-11 DIAGNOSIS — M25562 Pain in left knee: Secondary | ICD-10-CM

## 2018-02-11 DIAGNOSIS — M1712 Unilateral primary osteoarthritis, left knee: Secondary | ICD-10-CM | POA: Diagnosis not present

## 2018-02-11 NOTE — Progress Notes (Signed)
   Subjective:    Patient ID: Mia Parker, female    DOB: [birth year], 47 y.o.   MRN: [id number]  HPI She has a 2-week history of left knee pain.  She states that she woke up with this.  No history of injury or overuse.  She does feel that it is slightly swollen.  No other joints are involved.  She does have a popping sensation in that area.   Review of Systems     Objective:   Physical Exam Left knee exam shows no effusion.  Exquisite tenderness to palpation over the lateral pole of the patella.  Compression testing was negative.  Ileal pole was nontender.  Anterior drawer negative.  McMurray's testing negative.  Medial and lateral collateral ligaments intact.       Assessment & Plan:  Acute pain of left knee - Plan: DG Knee Complete 4 Views Left Hard to say what is causing the lateral aspect of the patella to be so sore.  I will get an x-ray and follow-up on this.  If the x-ray shows only minor changes or not at all, I think an injection would be worthwhile.

## 2018-02-12 ENCOUNTER — Other Ambulatory Visit: Payer: Self-pay

## 2018-02-12 DIAGNOSIS — M25562 Pain in left knee: Secondary | ICD-10-CM

## 2018-03-05 ENCOUNTER — Encounter (INDEPENDENT_AMBULATORY_CARE_PROVIDER_SITE_OTHER): Payer: Self-pay | Admitting: Orthopaedic Surgery

## 2018-03-05 ENCOUNTER — Ambulatory Visit (INDEPENDENT_AMBULATORY_CARE_PROVIDER_SITE_OTHER): Payer: 59 | Admitting: Orthopaedic Surgery

## 2018-03-05 VITALS — BP 127/74 | HR 96 | Resp 16 | Ht 69.0 in | Wt 280.0 lb

## 2018-03-05 DIAGNOSIS — G8929 Other chronic pain: Secondary | ICD-10-CM

## 2018-03-05 DIAGNOSIS — M25562 Pain in left knee: Secondary | ICD-10-CM

## 2018-03-05 MED ORDER — BUPIVACAINE HCL 0.5 % IJ SOLN
2.0000 mL | INTRAMUSCULAR | Status: AC | PRN
  Administered 2018-03-05: 2 mL via INTRA_ARTICULAR

## 2018-03-05 MED ORDER — METHYLPREDNISOLONE ACETATE 40 MG/ML IJ SUSP
80.0000 mg | INTRAMUSCULAR | Status: AC | PRN
  Administered 2018-03-05: 80 mg

## 2018-03-05 MED ORDER — LIDOCAINE HCL 1 % IJ SOLN
2.0000 mL | INTRAMUSCULAR | Status: AC | PRN
  Administered 2018-03-05: 2 mL

## 2018-03-05 NOTE — Progress Notes (Signed)
Office Visit Note   Patient: Mia Parker           Date of Birth: [birth year]           MRN: [id number] Visit Date: 03/05/2018              Requested by: Denita Lung, MD [address] [place], [place] [address] PCP: Denita Lung, MD   Assessment & Plan: Visit Diagnoses:  1. Chronic pain of left knee     Plan: Recent onset of left knee pain without injury or trauma.  Saw Dr. Redmond School who obtained x-rays.  Films demonstrate some arthritis but no acute change.  Has had a prior right knee arthroscopy for "cleanout" and has done well.  Will inject cortisone left knee and monitor her response.  If no improvement would consider MRI scan.  I believe the majority of her pain is related to the arthritis  Follow-Up Instructions: Return if symptoms worsen or fail to improve.   Orders:  Orders Placed This Encounter  Procedures  . Large Joint Inj: L knee   No orders of the defined types were placed in this encounter.     Procedures: Large Joint Inj: L knee on 03/05/2018 9:45 AM Indications: pain and diagnostic evaluation Details: 25 G 1.5 in needle, anteromedial approach  Arthrogram: No  Medications: 2 mL lidocaine 1 %; 2 mL bupivacaine 0.5 %; 80 mg methylPREDNISolone acetate 40 MG/ML Procedure, treatment alternatives, risks and benefits explained, specific risks discussed. Consent was given by the patient. Patient was prepped and draped in the usual sterile fashion.       Clinical Data: No additional findings.   Subjective: Chief Complaint  Patient presents with  . Left Knee - Pain  . Knee Pain    Left knee pain since May, 2019, swelling, popping, clicking, grinding noises, difficulty walking, medial and lateral pain, difficulty bearing weight, no injury, not diabetic, no surgery to left knee, IBU helps some  Mia Parker awoke one morning recently with considerable pain in her left knee.  She does not remember any specific injury or trauma.  She is had some  swelling, popping and clicking.  She saw Dr. Leda Roys to obtain films.  I reviewed these on the PACS system.  There appears to be tricompartmental degenerative change with some mild osteophytes more medial than lateral.  May be slight decrease the medial joint space.  No ectopic calcification but was some evidence of loose body formation in the popliteal fossa with a possible small popliteal cyst.  There were also degenerative changes of the patellofemoral joint.  No acute changes.  He had performed a right knee arthroscopy in 2015 "clean out" and she is done well since that time  HPI  Review of Systems  Constitutional: Positive for activity change.  HENT: Negative for trouble swallowing.   Eyes: Negative for pain.  Respiratory: Negative for shortness of breath.   Cardiovascular: Negative for chest pain.  Gastrointestinal: Negative for constipation.  Endocrine: Positive for heat intolerance.  Genitourinary: Negative for difficulty urinating.  Musculoskeletal: Positive for joint swelling.  Skin: Negative for rash.  Allergic/Immunologic: Negative for food allergies.  Neurological: Negative for numbness.  Hematological: Bruises/bleeds easily.  Psychiatric/Behavioral: Positive for sleep disturbance.     Objective: Vital Signs: BP 127/74 (BP Location: Left Arm, Patient Position: Sitting, Cuff Size: Normal)   Pulse 96   Resp 16   Ht 5' 9"  (1.753 m)   Wt 280 lb (127 kg)  LMP 01/27/2018   BMI 41.35 kg/m   Physical Exam  Constitutional: She is oriented to person, place, and time. She appears well-developed and well-nourished.  HENT:  Mouth/Throat: Oropharynx is clear and moist.  Eyes: Pupils are equal, round, and reactive to light. EOM are normal.  Pulmonary/Chest: Effort normal.  Neurological: She is alert and oriented to person, place, and time.  Skin: Skin is warm and dry.  Psychiatric: She has a normal mood and affect. Her behavior is normal.    Ortho Exam awake alert and  oriented x3.  Comfortable sitting.  Examination of the left knee reveals considerable patellar crepitation.  Mild medial lateral joint pain.  No instability.  No popliteal pain but there was some fullness.  No calf pain.  No edema.  Straight leg raise negative.  Vascular exam intact. no effusion.  Specialty Comments:  No specialty comments available.  Imaging: No results found.   PMFS History: Patient Active Problem List   Diagnosis Date Noted  . Allergic eosinophilia 10/11/2015  . Morbid obesity due to excess calories ([place] [facility]) 09/14/2015  . ADHD (attention deficit hyperactivity disorder) 07/08/2011   Past Medical History:  Diagnosis Date  . ADD (attention deficit disorder)   . AMA (advanced maternal age) multigravida 18+   . Diabetes mellitus without complication ([place])    GDM with 2017 pregnancy  . Hypertension    with 2017 pregnancy, no meds  . Infertility, female   . Obesity   . Postpartum edema 06/10/2013  . Sleep apnea    use CPAP nightly    Family History  Problem Relation Age of Onset  . Hypertension Mother   . Diabetes Father   . Hypertension Father   . Heart attack Father     Past Surgical History:  Procedure Laterality Date  . CESAREAN SECTION N/A 06/07/2013   Procedure: Primary Cesarean Section Delivery Baby  Boy @ 7902, Apgars   ;  Surgeon: Lovenia Kim, MD;  Location: [facility];  Service: Obstetrics;  Laterality: N/A;  . CESAREAN SECTION N/A 05/23/2016   Procedure: CESAREAN SECTION;  Surgeon: Brien Few, MD;  Location: [facility];  Service: Obstetrics;  Laterality: N/A;  . KNEE CARTILAGE SURGERY Right   . LEEP    . TUBAL LIGATION Bilateral 05/23/2016   Procedure: BILATERAL TUBAL LIGATION;  Surgeon: Brien Few, MD;  Location: [facility];  Service: Obstetrics;  Laterality: Bilateral;  . WISDOM TOOTH EXTRACTION     Social History   Occupational History  . Not on file  Tobacco Use  . Smoking status: Former Smoker    Packs/day: 0.50     Years: 15.00    Pack years: 7.50    Types: Cigarettes    Last attempt to quit: 09/29/2012    Years since quitting: 5.4  . Smokeless tobacco: Never Used  . Tobacco comment: quit in august of 2013  Substance and Sexual Activity  . Alcohol use: No    Alcohol/week: 0.0 oz  . Drug use: No  . 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

## 2018-03-19 ENCOUNTER — Telehealth: Payer: Self-pay

## 2018-03-19 NOTE — Telephone Encounter (Signed)
Patient called and wanted to see if we could call [facility] to authorize for her to get Adderall XR early, she is going on vacation today and can not pick it up until Monday.  Called Dr Redmond School and he approved to [facility] and give verbal approval that patient can get her RX early.  Patient notified .

## 2018-04-14 ENCOUNTER — Telehealth: Payer: Self-pay | Admitting: Family Medicine

## 2018-04-14 DIAGNOSIS — F900 Attention-deficit hyperactivity disorder, predominantly inattentive type: Secondary | ICD-10-CM

## 2018-04-14 MED ORDER — AMPHETAMINE-DEXTROAMPHETAMINE 20 MG PO TABS: 20.0000 mg | ORAL_TABLET | Freq: Three times a day (TID) | ORAL | 0 refills | Status: DC

## 2018-04-14 NOTE — Telephone Encounter (Signed)
Pt called and requested refills on Adderall. Please send to [facility].

## 2018-05-24 ENCOUNTER — Telehealth: Payer: Self-pay | Admitting: Family Medicine

## 2018-05-24 DIAGNOSIS — F900 Attention-deficit hyperactivity disorder, predominantly inattentive type: Secondary | ICD-10-CM

## 2018-05-24 MED ORDER — AMPHETAMINE-DEXTROAMPHETAMINE 20 MG PO TABS: 20.0000 mg | ORAL_TABLET | Freq: Three times a day (TID) | ORAL | 0 refills | Status: DC

## 2018-05-24 NOTE — Telephone Encounter (Signed)
Let her know that I called in a one-month supply and hopefully she can get her next one filled at her regular store

## 2018-05-24 NOTE — Telephone Encounter (Signed)
[facility]. Pt needs refills on Adderall. She has called around and the only pharmacy she can find that has it is [facility]. All [facility] stores she has called is out. Please send to [place], Mia Parker at [place]. Pt can be reached at  [phone].

## 2018-05-24 NOTE — Telephone Encounter (Signed)
Pt was called no answer. LVM

## 2018-08-19 ENCOUNTER — Telehealth: Payer: Self-pay

## 2018-08-19 DIAGNOSIS — F900 Attention-deficit hyperactivity disorder, predominantly inattentive type: Secondary | ICD-10-CM

## 2018-08-19 MED ORDER — AMPHETAMINE-DEXTROAMPHETAMINE 20 MG PO TABS: 20.0000 mg | ORAL_TABLET | Freq: Three times a day (TID) | ORAL | 0 refills | Status: DC

## 2018-08-19 NOTE — Telephone Encounter (Signed)
Pt called to advise she needs a script for her adderall. Please advise [facility]

## 2018-11-16 ENCOUNTER — Telehealth: Payer: Self-pay | Admitting: Family Medicine

## 2018-11-16 DIAGNOSIS — F900 Attention-deficit hyperactivity disorder, predominantly inattentive type: Secondary | ICD-10-CM

## 2018-11-16 MED ORDER — AMPHETAMINE-DEXTROAMPHETAMINE 20 MG PO TABS: 20.0000 mg | ORAL_TABLET | Freq: Three times a day (TID) | ORAL | 0 refills | Status: DC

## 2018-11-16 NOTE — Telephone Encounter (Signed)
She needs an appointment

## 2018-11-16 NOTE — Telephone Encounter (Signed)
Pt needs refill on Adderall [facility] [address]

## 2018-11-18 NOTE — Telephone Encounter (Signed)
Pt was called and no answer LVM for pt to call back and make appt. [place]

## 2018-12-03 ENCOUNTER — Encounter: Payer: Self-pay | Admitting: Family Medicine

## 2018-12-03 ENCOUNTER — Ambulatory Visit (INDEPENDENT_AMBULATORY_CARE_PROVIDER_SITE_OTHER): Payer: Managed Care, Other (non HMO) | Admitting: Family Medicine

## 2018-12-03 ENCOUNTER — Other Ambulatory Visit: Payer: Self-pay

## 2018-12-03 VITALS — BP 122/82 | HR 81 | Wt 263.0 lb

## 2018-12-03 DIAGNOSIS — M25562 Pain in left knee: Secondary | ICD-10-CM

## 2018-12-03 DIAGNOSIS — F900 Attention-deficit hyperactivity disorder, predominantly inattentive type: Secondary | ICD-10-CM | POA: Diagnosis not present

## 2018-12-03 MED ORDER — AMPHETAMINE-DEXTROAMPHETAMINE 20 MG PO TABS: 20.0000 mg | ORAL_TABLET | Freq: Three times a day (TID) | ORAL | 0 refills | Status: DC

## 2018-12-03 NOTE — Progress Notes (Signed)
   Subjective:    Patient ID: Mia Parker, female    DOB: 01-17-1971, 48 y.o.   MRN: [id number]  HPI She is here for a med check appointment.  She continues on Adderall and is using 3 times daily dosing which gives her flexibility toward use when she really needs this.  She is comfortable with this. She also had a knee injection approximately 9 months ago and is done well until the last 3 weeks.  The pain is now causing trouble even with minimal physical activity.  No popping locking or grinding.   Review of Systems     Objective:   Physical Exam Alert and in no distress.  Full motion of the knee.  No effusion noted.       Assessment & Plan:  Attention deficit hyperactivity disorder (ADHD), predominantly inattentive type - Plan: amphetamine-dextroamphetamine (ADDERALL) 20 MG tablet, amphetamine-dextroamphetamine (ADDERALL) 20 MG tablet  Left knee pain, unspecified chronicity The knee was prepped laterally with Betadine.  40 mg of Kenalog and 3 cc of Xylocaine was injected into the joint without difficulty.  She tolerated the procedure well.  I discussed the fact that as long she does not need shots very often we should continue to do this however if the interval between starts to strength we might need to reevaluate the whole issue.

## 2019-01-17 ENCOUNTER — Telehealth: Payer: Self-pay | Admitting: Family Medicine

## 2019-01-17 DIAGNOSIS — F900 Attention-deficit hyperactivity disorder, predominantly inattentive type: Secondary | ICD-10-CM

## 2019-01-17 MED ORDER — AMPHETAMINE-DEXTROAMPHETAMINE 20 MG PO TABS: 20.0000 mg | ORAL_TABLET | Freq: Three times a day (TID) | ORAL | 0 refills | Status: DC

## 2019-01-17 NOTE — Telephone Encounter (Signed)
Pt called and states her pharmacy is out of adderall.  She found it at [facility].  Please send her Adderall 20 mg tid to new location.

## 2019-01-17 NOTE — Telephone Encounter (Signed)
[facility] [address] her current pharmacy was out . Pharmacy was called and they confirmed that they would be out until wed. [place]

## 2019-04-15 ENCOUNTER — Telehealth: Payer: Self-pay | Admitting: Family Medicine

## 2019-04-15 DIAGNOSIS — F900 Attention-deficit hyperactivity disorder, predominantly inattentive type: Secondary | ICD-10-CM

## 2019-04-15 MED ORDER — AMPHETAMINE-DEXTROAMPHETAMINE 20 MG PO TABS: 20.0000 mg | ORAL_TABLET | Freq: Three times a day (TID) | ORAL | 0 refills | Status: DC

## 2019-04-15 NOTE — Telephone Encounter (Signed)
Pt needs refill on Adderall sent to the [facility] [address]

## 2019-05-16 ENCOUNTER — Telehealth: Payer: Self-pay | Admitting: Family Medicine

## 2019-05-16 DIAGNOSIS — F900 Attention-deficit hyperactivity disorder, predominantly inattentive type: Secondary | ICD-10-CM

## 2019-05-16 NOTE — Telephone Encounter (Signed)
Pt called and wanted to know if she could get refill on Adderall sent to the [facility] on [address] thursdsay instead of Saturday because she will be going out of town, Thursday night or Friday morning

## 2019-05-16 NOTE — Telephone Encounter (Signed)
Called [facility] and advised [facility]

## 2019-05-16 NOTE — Telephone Encounter (Signed)
Pt was advised Kh

## 2019-05-16 NOTE — Telephone Encounter (Signed)
Call pharmacy.  Okay to fill early.

## 2019-06-25 ENCOUNTER — Telehealth: Payer: Self-pay | Admitting: Family Medicine

## 2019-06-25 NOTE — Telephone Encounter (Signed)
P.A. ADDERALL done and approved, called pharmacy went thru for $52, but only $45 with discount card. Left message for pt

## 2019-07-20 ENCOUNTER — Telehealth: Payer: Self-pay | Admitting: Family Medicine

## 2019-07-20 DIAGNOSIS — F900 Attention-deficit hyperactivity disorder, predominantly inattentive type: Secondary | ICD-10-CM

## 2019-07-20 NOTE — Telephone Encounter (Signed)
Pt called for refills of adderall. Please send to [facility]. Pt made aware that JCL has left for the day.

## 2019-07-21 MED ORDER — AMPHETAMINE-DEXTROAMPHETAMINE 20 MG PO TABS: 20.0000 mg | ORAL_TABLET | Freq: Three times a day (TID) | ORAL | 0 refills | Status: DC

## 2019-10-20 ENCOUNTER — Telehealth: Payer: Self-pay

## 2019-10-20 DIAGNOSIS — F900 Attention-deficit hyperactivity disorder, predominantly inattentive type: Secondary | ICD-10-CM

## 2019-10-20 MED ORDER — AMPHETAMINE-DEXTROAMPHETAMINE 20 MG PO TABS: 20.0000 mg | ORAL_TABLET | Freq: Three times a day (TID) | ORAL | 0 refills | Status: DC

## 2019-10-20 NOTE — Telephone Encounter (Signed)
Pt. Called stating that she needs a refill on her Adderall to the [facility] [address]. Pt. Last apt. Was 12/03/18.

## 2019-11-14 IMAGING — CR DG SHOULDER 2+V*R*
3 series · 3 of 3 positions shown · non-contrast
Comparison: None.

CLINICAL DATA: Fall.  Shoulder pain with decreased range of motion.

EXAM:
RIGHT SHOULDER - 2+ VIEW

[w shoulder grashey right]
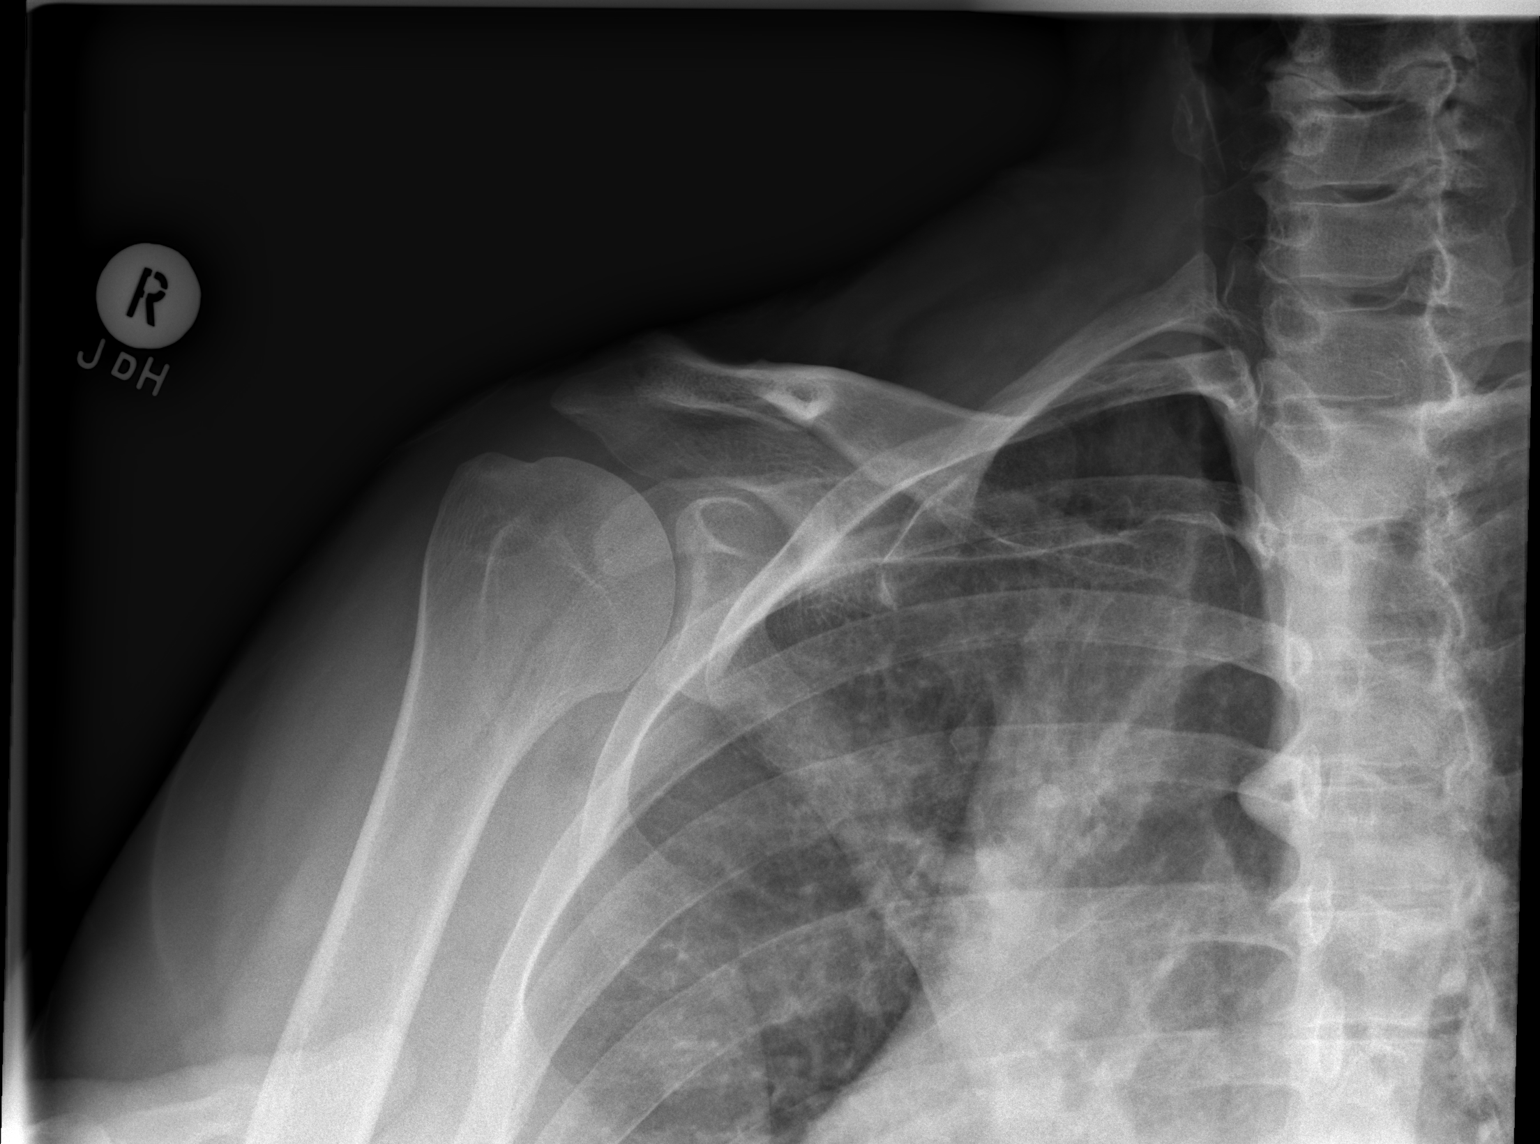

[w shoulder y-view right]
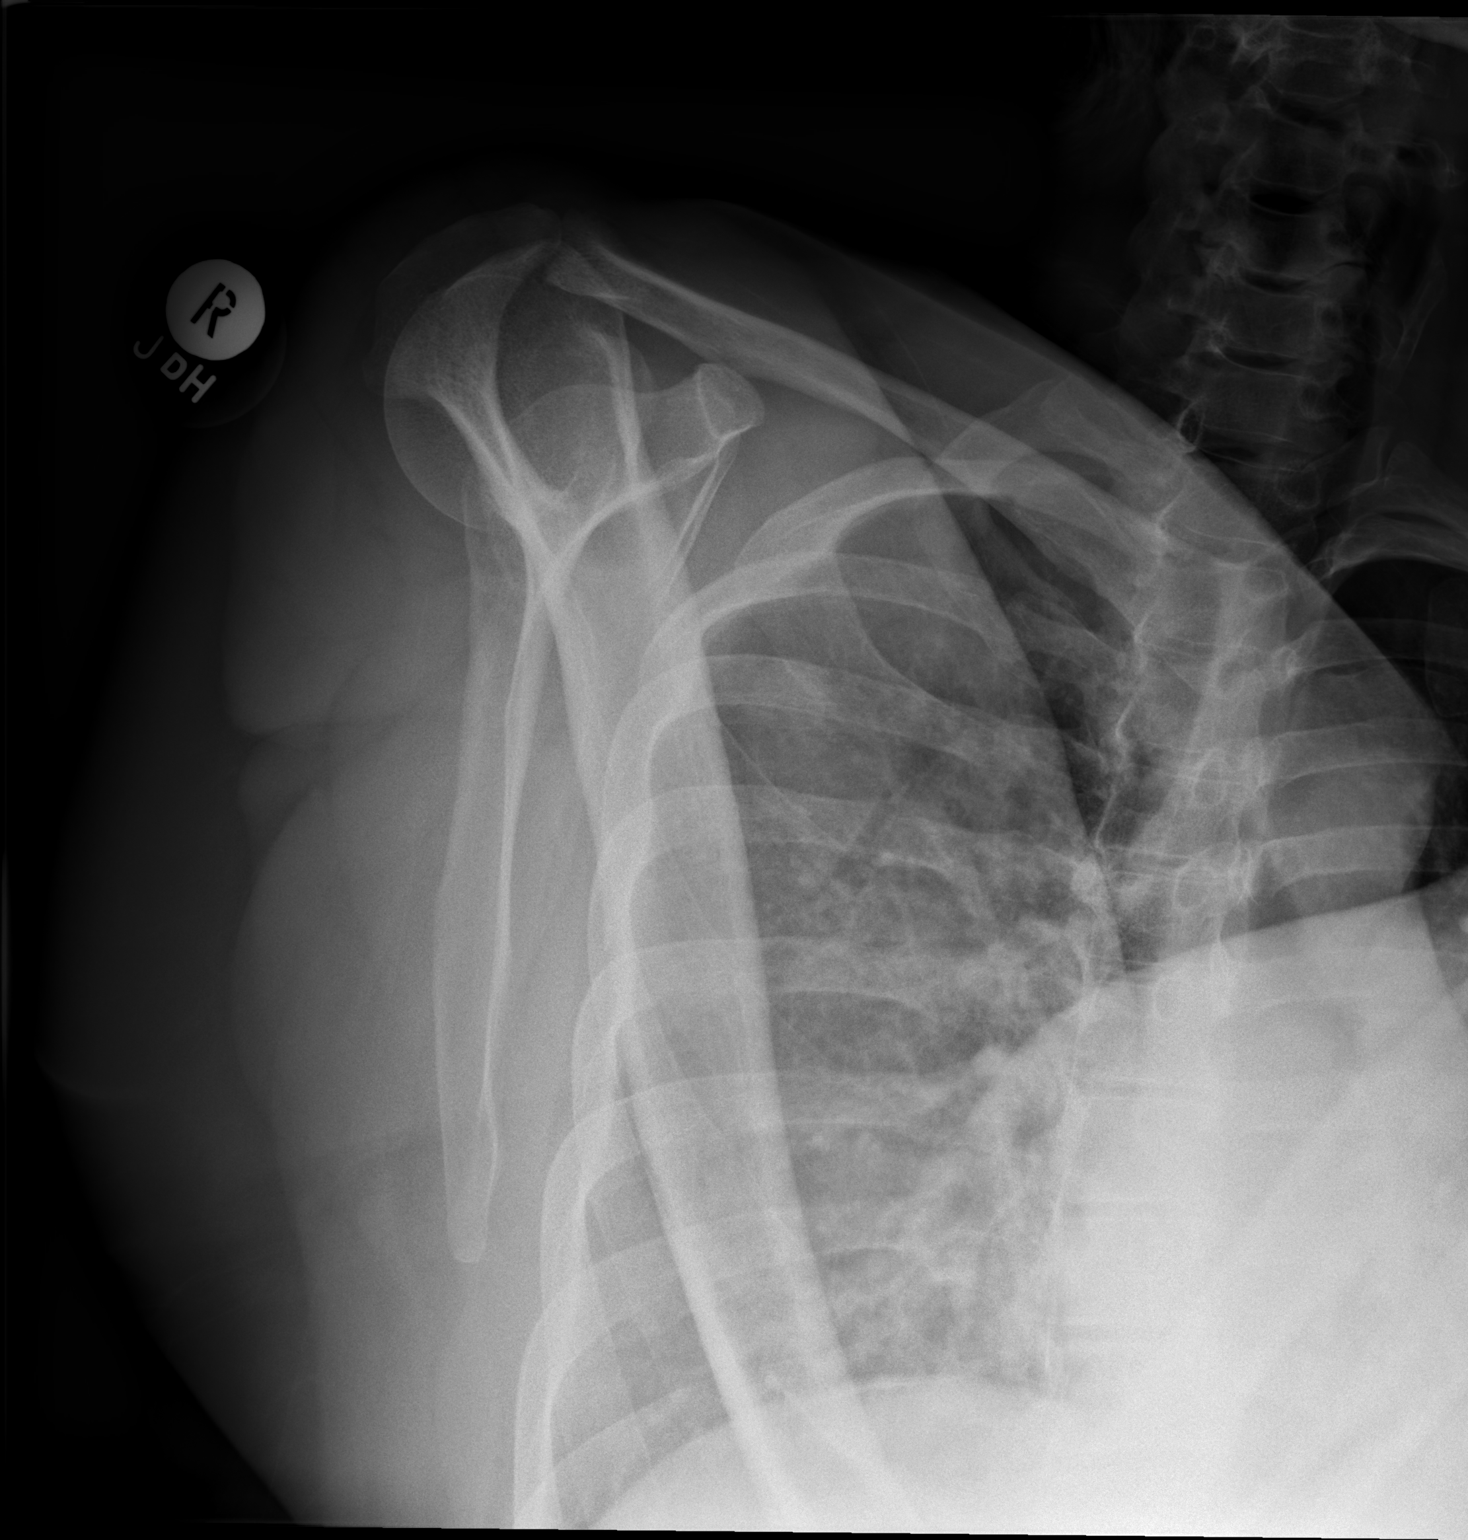

[w shoulder axillary right]
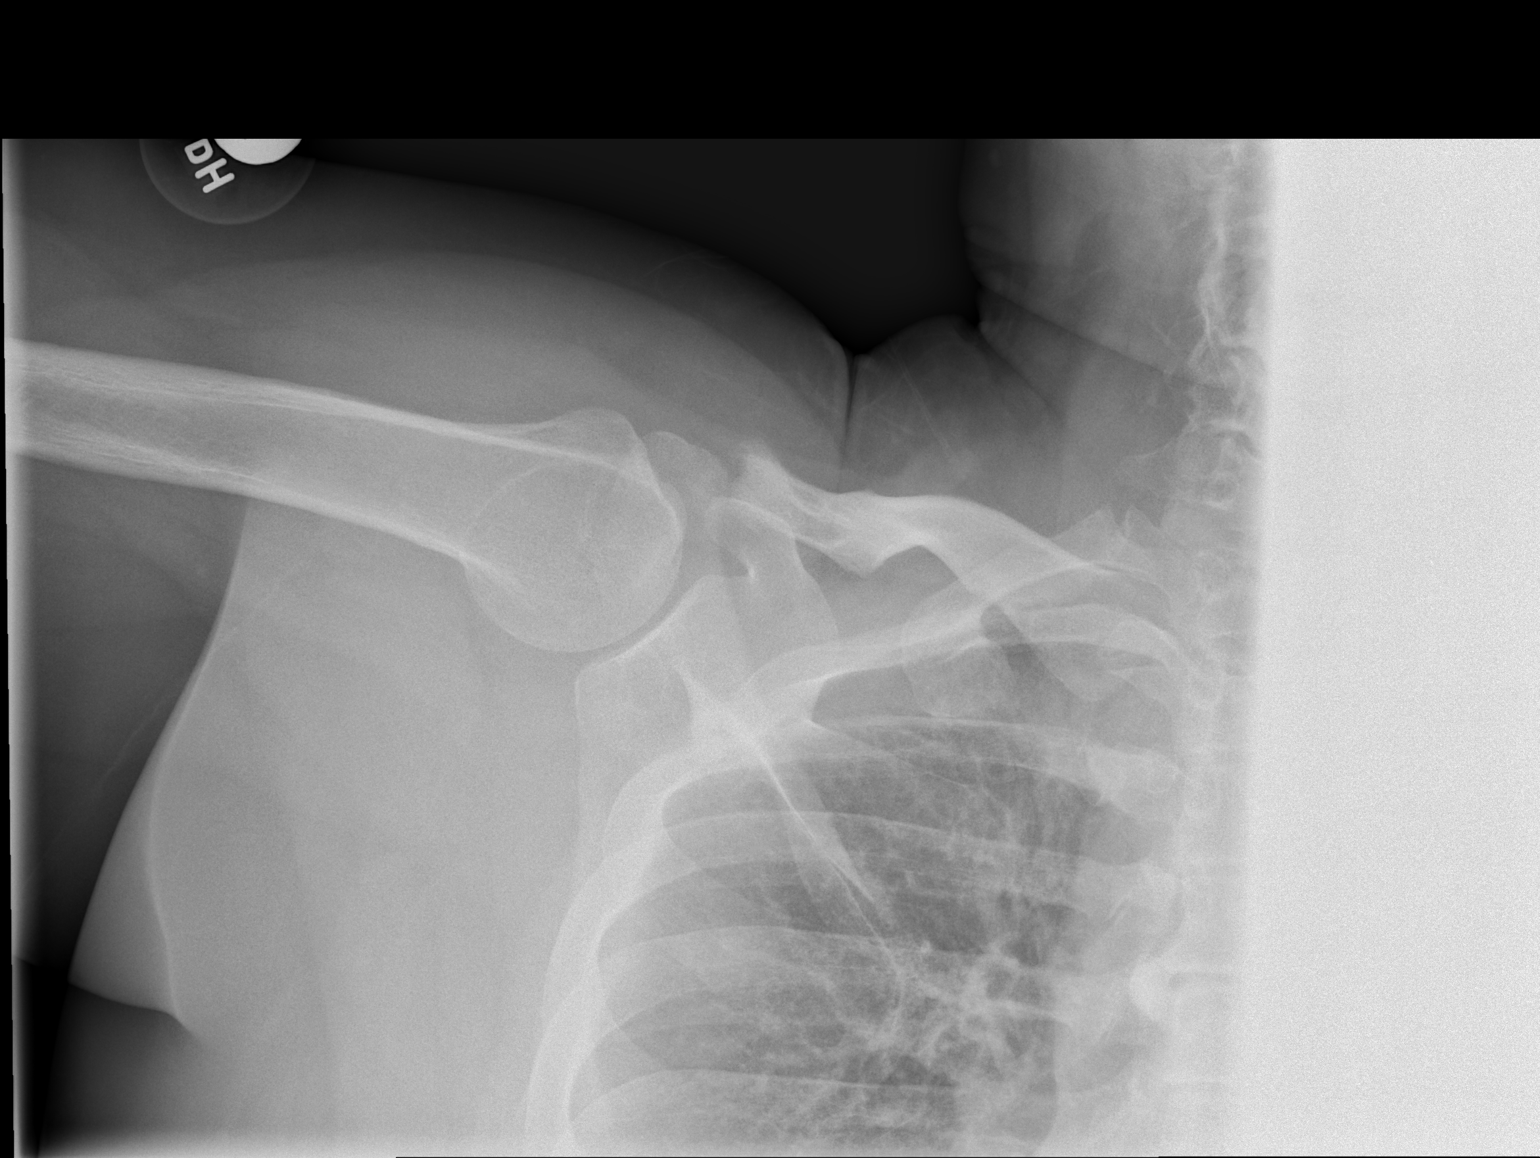

[3 of 3 positions shown; findings below may reference images not displayed]

FINDINGS: There is no evidence of fracture or dislocation. There is no
evidence of arthropathy or other focal bone abnormality. Soft
tissues are unremarkable.
IMPRESSION: Negative.

## 2020-01-19 ENCOUNTER — Telehealth: Payer: Self-pay | Admitting: Family Medicine

## 2020-01-19 MED ORDER — AMPHETAMINE-DEXTROAMPHETAMINE 20 MG PO TABS: 20.0000 mg | ORAL_TABLET | Freq: Three times a day (TID) | ORAL | 0 refills | Status: DC

## 2020-01-19 NOTE — Telephone Encounter (Signed)
It is time for a virtual office visit

## 2020-01-19 NOTE — Telephone Encounter (Signed)
Pt called for refills of adderall. Please send to [facility].

## 2020-01-24 NOTE — Telephone Encounter (Signed)
LVM for pt to contact office to schedule a follow appt on pt med. [place]

## 2020-02-08 ENCOUNTER — Telehealth: Payer: Self-pay

## 2020-02-08 NOTE — Telephone Encounter (Signed)
Called pt to advise that it is time to be seen for a med check [facility]

## 2020-02-22 ENCOUNTER — Other Ambulatory Visit: Payer: Self-pay

## 2020-02-22 ENCOUNTER — Ambulatory Visit (INDEPENDENT_AMBULATORY_CARE_PROVIDER_SITE_OTHER): Payer: 59 | Admitting: Family Medicine

## 2020-02-22 ENCOUNTER — Encounter: Payer: Self-pay | Admitting: Family Medicine

## 2020-02-22 VITALS — BP 128/84 | HR 82 | Temp 98.4°F | Wt 264.6 lb

## 2020-02-22 DIAGNOSIS — Z8632 Personal history of gestational diabetes: Secondary | ICD-10-CM

## 2020-02-22 DIAGNOSIS — E7439 Other disorders of intestinal carbohydrate absorption: Secondary | ICD-10-CM

## 2020-02-22 DIAGNOSIS — Z0000 Encounter for general adult medical examination without abnormal findings: Secondary | ICD-10-CM | POA: Diagnosis not present

## 2020-02-22 DIAGNOSIS — Z1211 Encounter for screening for malignant neoplasm of colon: Secondary | ICD-10-CM

## 2020-02-22 DIAGNOSIS — F900 Attention-deficit hyperactivity disorder, predominantly inattentive type: Secondary | ICD-10-CM

## 2020-02-22 DIAGNOSIS — Z8659 Personal history of other mental and behavioral disorders: Secondary | ICD-10-CM

## 2020-02-22 DIAGNOSIS — Z1159 Encounter for screening for other viral diseases: Secondary | ICD-10-CM | POA: Diagnosis not present

## 2020-02-22 DIAGNOSIS — Z8759 Personal history of other complications of pregnancy, childbirth and the puerperium: Secondary | ICD-10-CM

## 2020-02-22 LAB — POCT GLYCOSYLATED HEMOGLOBIN (HGB A1C): Hemoglobin A1C: 5.7 % — AB (ref 4.0–5.6)

## 2020-02-22 MED ORDER — AMPHETAMINE-DEXTROAMPHETAMINE 20 MG PO TABS: 20.0000 mg | ORAL_TABLET | Freq: Three times a day (TID) | ORAL | 0 refills | Status: DC

## 2020-02-22 NOTE — Progress Notes (Signed)
   Subjective:    Patient ID: Mia Parker, female    DOB: 1970-09-16, 49 y.o.   MRN: [id number]  HPI She is here for complete examination.  She states that she did have Covid last November and since then is totally symptom-free.  At this time she does not plan to get the vaccination.  She does have underlying ADHD and has done quite nicely on Adderall three times per day.  Sometimes she does not take the full three times per day.  With her last pregnancy she did have gestational diabetes and also had postpartum depression.  Presently she is on no psychotropic medications and states that she is doing quite nicely.  She was given Metformin while she was pregnant but has not been on it in several years.  She has started yoga and has lost several pounds.  Her marriage and home life as well as work are going quite well.  She plans to follow-up with her gynecologist concerning Pap and pelvic as well as mammogram.  Family and social history as well as health maintenance and immunizations was reviewed   Review of Systems  All other systems reviewed and are negative.      Objective:   Physical Exam Alert and in no distress. Tympanic membranes and canals are normal. Pharyngeal area is normal. Neck is supple without adenopathy or thyromegaly. Cardiac exam shows a regular sinus rhythm without murmurs or gallops. Lungs are clear to auscultation. Hemoglobin A1c is 5.7.      Assessment & Plan:  Routine general medical examination at a health care facility - Plan: CBC with Differential/Platelet, Comprehensive metabolic panel, Lipid panel  Attention deficit hyperactivity disorder (ADHD), predominantly inattentive type - Plan: amphetamine-dextroamphetamine (ADDERALL) 20 MG tablet, amphetamine-dextroamphetamine (ADDERALL) 20 MG tablet, amphetamine-dextroamphetamine (ADDERALL) 20 MG tablet  Morbid obesity due to excess calories ([facility])  Need for hepatitis C screening test - Plan: Hepatitis C  antibody  History of gestational diabetes  Glucose intolerance - Plan: HgB A1c  Screening for colon cancer - Plan: Cologuard  History of postpartum depression Discussed colon cancer screening with her and will therefore order the Cologuard as there is no family history of polyps. She is doing quite nicely on her present Adderall dosing. I discussed the gestational diabetes as well as the hemoglobin A1c of 5.7.  Explained she now considered glucose intolerant.  Encouraged her to continue with her exercise regimen and cut back on carbohydrates. I discussed the postpartum depression with her but at this point do not feel any further intervention is needed.

## 2020-02-23 LAB — CBC WITH DIFFERENTIAL/PLATELET
Basophils Absolute: 0.2 10*3/uL (ref 0.0–0.2)
Basos: 1 %
EOS (ABSOLUTE): 1.3 10*3/uL — ABNORMAL HIGH (ref 0.0–0.4)
Eos: 11 %
Hematocrit: 41 % (ref 34.0–46.6)
Hemoglobin: 13.7 g/dL (ref 11.1–15.9)
Immature Grans (Abs): 0 10*3/uL (ref 0.0–0.1)
Immature Granulocytes: 0 %
Lymphocytes Absolute: 3.2 10*3/uL — ABNORMAL HIGH (ref 0.7–3.1)
Lymphs: 28 %
MCH: 29 pg (ref 26.6–33.0)
MCHC: 33.4 g/dL (ref 31.5–35.7)
MCV: 87 fL (ref 79–97)
Monocytes Absolute: 0.9 10*3/uL (ref 0.1–0.9)
Monocytes: 8 %
Neutrophils Absolute: 6.2 10*3/uL (ref 1.4–7.0)
Neutrophils: 52 %
Platelets: 335 10*3/uL (ref 150–450)
RBC: 4.73 x10E6/uL (ref 3.77–5.28)
RDW: 12.8 % (ref 11.7–15.4)
WBC: 11.8 10*3/uL — ABNORMAL HIGH (ref 3.4–10.8)

## 2020-02-23 LAB — COMPREHENSIVE METABOLIC PANEL
ALT: 11 IU/L (ref 0–32)
AST: 18 IU/L (ref 0–40)
Albumin/Globulin Ratio: 1.5 (ref 1.2–2.2)
Albumin: 4.4 g/dL (ref 3.8–4.8)
Alkaline Phosphatase: 91 IU/L (ref 48–121)
BUN/Creatinine Ratio: 16 (ref 9–23)
BUN: 13 mg/dL (ref 6–24)
Bilirubin Total: 0.3 mg/dL (ref 0.0–1.2)
CO2: 23 mmol/L (ref 20–29)
Calcium: 9.6 mg/dL (ref 8.7–10.2)
Chloride: 103 mmol/L (ref 96–106)
Creatinine, Ser: 0.83 mg/dL (ref 0.57–1.00)
GFR calc Af Amer: 97 mL/min/{1.73_m2} (ref >59)
GFR calc non Af Amer: 84 mL/min/{1.73_m2} (ref >59)
Globulin, Total: 3 g/dL (ref 1.5–4.5)
Glucose: 96 mg/dL (ref 65–99)
Potassium: 4.4 mmol/L (ref 3.5–5.2)
Sodium: 137 mmol/L (ref 134–144)
Total Protein: 7.4 g/dL (ref 6.0–8.5)

## 2020-02-23 LAB — LIPID PANEL
Chol/HDL Ratio: 2.4 ratio (ref 0.0–4.4)
Cholesterol, Total: 186 mg/dL (ref 100–199)
HDL: 79 mg/dL (ref >39)
LDL Chol Calc (NIH): 94 mg/dL (ref 0–99)
Triglycerides: 71 mg/dL (ref 0–149)
VLDL Cholesterol Cal: 13 mg/dL (ref 5–40)

## 2020-02-23 LAB — HEPATITIS C ANTIBODY: Hep C Virus Ab: <0.1 s/co ratio (ref 0.0–0.9)

## 2020-04-09 ENCOUNTER — Encounter: Payer: Managed Care, Other (non HMO) | Admitting: Family Medicine

## 2020-04-10 LAB — COLOGUARD: Cologuard: NEGATIVE

## 2020-04-11 ENCOUNTER — Ambulatory Visit (INDEPENDENT_AMBULATORY_CARE_PROVIDER_SITE_OTHER): Payer: 59 | Admitting: Family Medicine

## 2020-04-11 ENCOUNTER — Other Ambulatory Visit: Payer: Self-pay

## 2020-04-11 ENCOUNTER — Encounter: Payer: Self-pay | Admitting: Family Medicine

## 2020-04-11 VITALS — BP 128/76 | HR 76 | Temp 98.1°F | Wt 261.8 lb

## 2020-04-11 DIAGNOSIS — M533 Sacrococcygeal disorders, not elsewhere classified: Secondary | ICD-10-CM | POA: Diagnosis not present

## 2020-04-11 NOTE — Progress Notes (Signed)
   Subjective:    Patient ID: Mia Parker, female    DOB: 05/19/1971, 49 y.o.   MRN: [id number]  HPI She complains of intermittent coccygeal discomfort for the last 4 months however in the last 3 days it has been more or less constant.  No history of injury to the area.  No rectal or vaginal complaints.  She does do yoga but has not done anything unusual.   Review of Systems     Objective:   Physical Exam No tenderness palpation over the lumbar spine, SI joint or sciatic notch.  Tender over coccygeal area.  No palpable tenderness to the rectal or vaginal area.       Assessment & Plan:  Coccydynia Recommend warm tub baths and 800 mg ibuprofen 3 times daily.  She is to do this for the next week or 2.  If she has more pain or the symptoms do not go away, she is to return here for further evaluation.  Explained that I did not think this was rectal or vaginal but may need to readdress the issue if she continues to have discomfort.

## 2020-04-13 LAB — COLOGUARD: COLOGUARD: NEGATIVE

## 2020-04-13 LAB — EXTERNAL GENERIC LAB PROCEDURE: COLOGUARD: NEGATIVE

## 2020-04-16 NOTE — Progress Notes (Signed)
Called pt to advise of negative cologurad. No answer. LVM [place]

## 2020-04-18 ENCOUNTER — Encounter: Payer: Self-pay | Admitting: Family Medicine

## 2020-05-28 ENCOUNTER — Telehealth: Payer: Self-pay | Admitting: Family Medicine

## 2020-05-28 DIAGNOSIS — F900 Attention-deficit hyperactivity disorder, predominantly inattentive type: Secondary | ICD-10-CM

## 2020-05-28 MED ORDER — AMPHETAMINE-DEXTROAMPHETAMINE 20 MG PO TABS: 20.0000 mg | ORAL_TABLET | Freq: Three times a day (TID) | ORAL | 0 refills | Status: DC

## 2020-05-28 NOTE — Telephone Encounter (Signed)
Needs refill on adderral  [facility]

## 2020-06-14 ENCOUNTER — Ambulatory Visit
Admission: RE | Admit: 2020-06-14 | Discharge: 2020-06-14 | Disposition: A | Discharge status: Home / Self Care | Payer: 59 | Source: Ambulatory Visit | Attending: Family Medicine | Admitting: Family Medicine

## 2020-06-14 ENCOUNTER — Encounter: Payer: Self-pay | Admitting: Family Medicine

## 2020-06-14 ENCOUNTER — Other Ambulatory Visit: Payer: Self-pay

## 2020-06-14 ENCOUNTER — Ambulatory Visit (INDEPENDENT_AMBULATORY_CARE_PROVIDER_SITE_OTHER): Payer: 59 | Admitting: Family Medicine

## 2020-06-14 VITALS — BP 146/92 | HR 83 | Temp 98.5°F | Wt 257.0 lb

## 2020-06-14 DIAGNOSIS — M533 Sacrococcygeal disorders, not elsewhere classified: Secondary | ICD-10-CM | POA: Diagnosis not present

## 2020-06-14 LAB — POCT URINALYSIS DIP (PROADVANTAGE DEVICE)
Bilirubin, UA: NEGATIVE
Glucose, UA: NEGATIVE mg/dL
Ketones, POC UA: NEGATIVE mg/dL
Leukocytes, UA: NEGATIVE
Nitrite, UA: NEGATIVE
Specific Gravity, Urine: 1.03
Urobilinogen, Ur: 0.2
pH, UA: 6 (ref 5.0–8.0)

## 2020-06-14 NOTE — Progress Notes (Signed)
   Subjective:    Patient ID: Mia Parker, female    DOB: Jul 31, 1971, 49 y.o.   MRN: [id number]  HPI She is here for a recheck.  She does have a previous history of coccydynia and did use anti-inflammatories and heat to the area but notes that this is gotten worse and she is now having problems superior to this on the left and right side.  Her menses are normal and she has had her tubes tied.  Bowel habits and urinary symptoms are negative.   Review of Systems     Objective:   Physical Exam Palpable tenderness noted over the sacral area and to a lesser extent over the coccygeal area.  Stork test was difficult to do due to pain.  Good hip motion.  Urinalysis was negative. Urine microscopic showed scattered red cells.      Assessment & Plan:  Sacral back pain - Plan: DG Lumbar Spine Complete, POCT Urinalysis DIP (Proadvantage Device) I will do x-rays to make sure that there is nothing abnormal down there.  If no improvement may need to get a second opinion concerning this.

## 2020-06-18 ENCOUNTER — Telehealth: Payer: Self-pay | Admitting: Family Medicine

## 2020-06-18 NOTE — Telephone Encounter (Signed)
Pt called and states that she was to call and let JCL know how she is. She states that she is the same. Back still hurts. She is no longer constipated.

## 2020-06-18 NOTE — Telephone Encounter (Signed)
Lets have her come in and let me do another urinalysis.  He can be a nurse visit please let me say hi to her when she comes in

## 2020-06-18 NOTE — Telephone Encounter (Signed)
Appt made. [place]

## 2020-06-19 ENCOUNTER — Other Ambulatory Visit: Payer: Self-pay

## 2020-06-19 ENCOUNTER — Other Ambulatory Visit (INDEPENDENT_AMBULATORY_CARE_PROVIDER_SITE_OTHER): Payer: 59

## 2020-06-19 ENCOUNTER — Encounter: Payer: Self-pay | Admitting: Family Medicine

## 2020-06-19 DIAGNOSIS — M533 Sacrococcygeal disorders, not elsewhere classified: Secondary | ICD-10-CM | POA: Diagnosis not present

## 2020-06-19 DIAGNOSIS — R319 Hematuria, unspecified: Secondary | ICD-10-CM

## 2020-06-19 LAB — POCT URINALYSIS DIP (PROADVANTAGE DEVICE)
Bilirubin, UA: NEGATIVE
Glucose, UA: NEGATIVE mg/dL
Ketones, POC UA: NEGATIVE mg/dL
Leukocytes, UA: NEGATIVE
Nitrite, UA: NEGATIVE
Specific Gravity, Urine: 1.03
Urobilinogen, Ur: NEGATIVE
pH, UA: 6 (ref 5.0–8.0)

## 2020-06-20 LAB — URINE CULTURE

## 2020-06-21 ENCOUNTER — Other Ambulatory Visit: Payer: Self-pay

## 2020-06-21 DIAGNOSIS — M533 Sacrococcygeal disorders, not elsewhere classified: Secondary | ICD-10-CM

## 2020-06-27 ENCOUNTER — Encounter: Payer: Self-pay | Admitting: Family Medicine

## 2020-06-27 ENCOUNTER — Ambulatory Visit (INDEPENDENT_AMBULATORY_CARE_PROVIDER_SITE_OTHER): Payer: 59 | Admitting: Family Medicine

## 2020-06-27 ENCOUNTER — Other Ambulatory Visit: Payer: Self-pay

## 2020-06-27 DIAGNOSIS — M533 Sacrococcygeal disorders, not elsewhere classified: Secondary | ICD-10-CM | POA: Diagnosis not present

## 2020-06-27 MED ORDER — BACLOFEN 10 MG PO TABS: 5.0000 mg | ORAL_TABLET | Freq: Every evening | ORAL | 3 refills | Status: DC | PRN

## 2020-06-27 MED ORDER — PREDNISONE 10 MG PO TABS: ORAL_TABLET | ORAL | 0 refills | Status: DC

## 2020-06-27 NOTE — Progress Notes (Signed)
Pain since May No known injury  Tried HEP and ibuprofen

## 2020-06-27 NOTE — Progress Notes (Signed)
Office Visit Note   Patient: Mia Parker           Date of Birth: March 19, 1971           MRN: [id number] Visit Date: 06/27/2020 Requested by: Denita Lung, MD [place],  [place] [address] PCP: Denita Lung, MD  Subjective: Chief Complaint  Patient presents with  . Lower Back - Pain    HPI: 49yo F presenting to clinic with chronic, severe sacral/coccygeal pain. Patient has been trouble by severe lower back pain since May, and was told by her PCM to try Ibuprofen and hot baths, which hasn't offered any relief. She also tried to look up stretches she can do on her own at home, though these similarly where not helpful. She states she has severe, stabbing back in her tail bone, which is worsened by sitting, standing, laying. Her husband will try to massage the area, though she says 'there are times its so painful I just want to hit him!' She says that the pain will radiate across her back or into her buttocks, but not down her legs. She said she may have noticed tingling in her toes once, but this was very transient. No bowel/bladder dysfunction. She denies any rashes to the area. She says she is otherwise in good health, and has never experienced pain like this before.               ROS:   All other systems were reviewed and are negative.  Objective: Vital Signs: There were no vitals taken for this visit.  Physical Exam:  General:  Alert and oriented, in no acute distress. Pulm:  Breathing unlabored. Psy:  Normal mood, congruent affect. Skin:  Lower back/buttocks with no bruises, no rashes. Skin intact.   Lower back Exam:  Antalgic gait.  5/5 strength with hip flexion (though this is very painful to test on left), and knee flexion/extesion, as well as ankle dorsi/plantar flexion. Sensaiton intact thorughout BLE.  2+ Patellar reflexes.   Significant tenderness to palpation over left aspect of sacrum and most notably over coccyx, as well as within the left  gluteal musculature.   Imaging: None Today.  Assessment & Plan: 49yo F presenting to clinic with chronic coccygeal pain for the past 6 months. Examination with significant tenderness focused over coccyx, though neurological examination is reassuring.  - Will try prednisone to calm inflammation within the area, as well as muscle relaxer for spasm -Physical therapy referral placed -If not improving, RTC for consideration of advanced imaging.      Procedures: No procedures performed  No notes on file     PMFS History: Patient Active Problem List   Diagnosis Date Noted  . History of postpartum depression 02/22/2020  . History of gestational diabetes 02/22/2020  . Glucose intolerance 02/22/2020  . Allergic eosinophilia 10/11/2015  . Morbid obesity due to excess calories ([place]) 09/14/2015  . ADHD (attention deficit hyperactivity disorder) 07/08/2011   Past Medical History:  Diagnosis Date  . ADD (attention deficit disorder)   . AMA (advanced maternal age) multigravida 63+   . Diabetes mellitus without complication ([place])    GDM with 2017 pregnancy  . Hypertension    with 2017 pregnancy, no meds  . Infertility, female   . Obesity   . Postpartum edema 06/10/2013  . Sleep apnea    use CPAP nightly    Family History  Problem Relation Age of Onset  . Hypertension Mother   .  Diabetes Father   . Hypertension Father   . Heart attack Father     Past Surgical History:  Procedure Laterality Date  . CESAREAN SECTION N/A 06/07/2013   Procedure: Primary Cesarean Section Delivery Baby  Boy @ 6153, Apgars   ;  Surgeon: Lovenia Kim, MD;  Location: [facility];  Service: Obstetrics;  Laterality: N/A;  . CESAREAN SECTION N/A 05/23/2016   Procedure: CESAREAN SECTION;  Surgeon: Brien Few, MD;  Location: [facility];  Service: Obstetrics;  Laterality: N/A;  . KNEE CARTILAGE SURGERY Right   . LEEP    . TUBAL LIGATION Bilateral 05/23/2016   Procedure: BILATERAL TUBAL LIGATION;   Surgeon: Brien Few, MD;  Location: [facility];  Service: Obstetrics;  Laterality: Bilateral;  . WISDOM TOOTH EXTRACTION     Social History   Occupational History  . Not on file  Tobacco Use  . Smoking status: Former Smoker    Packs/day: 0.50    Years: 15.00    Pack years: 7.50    Types: Cigarettes    Quit date: 09/29/2012    Years since quitting: 7.7  . Smokeless tobacco: Never Used  . Tobacco comment: quit in august of 2013  Vaping Use  . Vaping Use: Never used  Substance and Sexual Activity  . Alcohol use: No    Alcohol/week: 0.0 standard drinks  . Drug use: No  . 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

## 2020-06-27 NOTE — Progress Notes (Signed)
I saw and examined the patient with Dr. Elouise Munroe and agree with assessment and plan as outlined.    Sacral pain since May, no injury.  Very tender to palpation, hurts constantly.  Will try PT, meds.  Consider MRI sacrum to rule out insufficiency fracture if doesn't improve.

## 2020-07-05 ENCOUNTER — Encounter: Payer: Self-pay | Admitting: Family Medicine

## 2020-08-22 ENCOUNTER — Telehealth: Payer: Self-pay | Admitting: Family Medicine

## 2020-08-22 NOTE — Telephone Encounter (Signed)
Dismissal letter in guarantor snapshot

## 2020-08-27 ENCOUNTER — Telehealth: Payer: Self-pay | Admitting: Family Medicine

## 2020-08-27 DIAGNOSIS — F900 Attention-deficit hyperactivity disorder, predominantly inattentive type: Secondary | ICD-10-CM

## 2020-08-27 MED ORDER — AMPHETAMINE-DEXTROAMPHETAMINE 20 MG PO TABS: 20.0000 mg | ORAL_TABLET | Freq: Three times a day (TID) | ORAL | 0 refills | Status: DC

## 2020-08-27 NOTE — Telephone Encounter (Signed)
Pt called for refills. She was dismissed on 08/22/2020 so is within the 30 day window. Please refills [facility] to [facility]. Pt can be reached at [phone].

## 2020-10-13 ENCOUNTER — Other Ambulatory Visit: Payer: Self-pay | Admitting: Family Medicine

## 2020-10-18 ENCOUNTER — Telehealth: Payer: Self-pay | Admitting: Family Medicine

## 2020-10-18 DIAGNOSIS — M5418 Radiculopathy, sacral and sacrococcygeal region: Secondary | ICD-10-CM | POA: Diagnosis not present

## 2020-10-18 DIAGNOSIS — M545 Low back pain, unspecified: Secondary | ICD-10-CM | POA: Diagnosis not present

## 2020-10-18 NOTE — Telephone Encounter (Signed)
Pt left message wanting adderall refillled.  It appears pt dismissed by billing office.  Called pt reached vm lmtrc.

## 2020-10-31 DIAGNOSIS — M5418 Radiculopathy, sacral and sacrococcygeal region: Secondary | ICD-10-CM | POA: Diagnosis not present

## 2020-10-31 DIAGNOSIS — M545 Low back pain, unspecified: Secondary | ICD-10-CM | POA: Diagnosis not present

## 2020-11-06 ENCOUNTER — Encounter: Payer: 59 | Admitting: Family Medicine

## 2020-11-07 ENCOUNTER — Encounter: Payer: Self-pay | Admitting: Family Medicine

## 2020-11-07 ENCOUNTER — Other Ambulatory Visit: Payer: Self-pay

## 2020-11-07 ENCOUNTER — Ambulatory Visit (INDEPENDENT_AMBULATORY_CARE_PROVIDER_SITE_OTHER): Payer: BC Managed Care – PPO | Admitting: Family Medicine

## 2020-11-07 DIAGNOSIS — F900 Attention-deficit hyperactivity disorder, predominantly inattentive type: Secondary | ICD-10-CM

## 2020-11-07 MED ORDER — AMPHETAMINE-DEXTROAMPHETAMINE 20 MG PO TABS: 20.0000 mg | ORAL_TABLET | Freq: Three times a day (TID) | ORAL | 0 refills | Status: DC

## 2020-11-07 NOTE — Progress Notes (Signed)
   Subjective:    Patient ID: Mia Parker, female    DOB: Jan 27, 1971, 50 y.o.   MRN: [id number]  HPI She is here for recheck.  She is doing quite nicely on Adderall.  She takes it 3 times per day each pill lasts roughly 4 hours.  It wears off when she notes increased distractibility.  She has no other symptoms.  And weight is constant.   Review of Systems     Objective:   Physical Exam Alert and in no distress otherwise not examined       Assessment & Plan:  Attention deficit hyperactivity disorder (ADHD), predominantly inattentive type - Plan: amphetamine-dextroamphetamine (ADDERALL) 20 MG tablet, amphetamine-dextroamphetamine (ADDERALL) 20 MG tablet, amphetamine-dextroamphetamine (ADDERALL) 20 MG tablet

## 2020-11-14 ENCOUNTER — Ambulatory Visit: Payer: Self-pay

## 2020-11-14 ENCOUNTER — Other Ambulatory Visit: Payer: Self-pay

## 2020-11-14 ENCOUNTER — Other Ambulatory Visit: Payer: Self-pay | Admitting: Occupational Medicine

## 2020-11-14 DIAGNOSIS — M533 Sacrococcygeal disorders, not elsewhere classified: Secondary | ICD-10-CM

## 2020-11-14 MED ORDER — HYDROCODONE-ACETAMINOPHEN 10-325 MG PO TABS: 1.0000 | ORAL_TABLET | Freq: Three times a day (TID) | ORAL | 0 refills | Status: AC | PRN

## 2021-03-27 ENCOUNTER — Telehealth: Payer: Self-pay

## 2021-03-27 DIAGNOSIS — F900 Attention-deficit hyperactivity disorder, predominantly inattentive type: Secondary | ICD-10-CM

## 2021-03-27 NOTE — Telephone Encounter (Signed)
Pt. Called stating she need a refill on her Adderall pt. Last apt was 11/07/20.

## 2021-03-29 MED ORDER — AMPHETAMINE-DEXTROAMPHETAMINE 20 MG PO TABS: 20.0000 mg | ORAL_TABLET | Freq: Three times a day (TID) | ORAL | 0 refills | Status: DC

## 2021-03-29 MED ORDER — AMPHETAMINE-DEXTROAMPHETAMINE 20 MG PO TABS
20.0000 mg | ORAL_TABLET | Freq: Three times a day (TID) | ORAL | 0 refills | Status: DC
Start: 2021-05-30 — End: 2022-10-08

## 2021-03-29 MED ORDER — AMPHETAMINE-DEXTROAMPHETAMINE 20 MG PO TABS
20.0000 mg | ORAL_TABLET | Freq: Three times a day (TID) | ORAL | 0 refills | Status: DC
Start: 2021-03-29 — End: 2022-10-08

## 2021-04-01 ENCOUNTER — Telehealth: Payer: Self-pay

## 2021-04-01 NOTE — Telephone Encounter (Signed)
Called pt to schedule a surgery clearance. Form is at my desk in the hold folder. [place]

## 2021-04-08 ENCOUNTER — Encounter: Payer: Self-pay | Admitting: Family Medicine

## 2021-04-08 ENCOUNTER — Other Ambulatory Visit: Payer: Self-pay

## 2021-04-08 ENCOUNTER — Ambulatory Visit (INDEPENDENT_AMBULATORY_CARE_PROVIDER_SITE_OTHER): Payer: BC Managed Care – PPO | Admitting: Family Medicine

## 2021-04-08 VITALS — BP 138/82 | HR 84 | Wt 275.6 lb

## 2021-04-08 DIAGNOSIS — Z01818 Encounter for other preprocedural examination: Secondary | ICD-10-CM

## 2021-04-08 DIAGNOSIS — M48061 Spinal stenosis, lumbar region without neurogenic claudication: Secondary | ICD-10-CM | POA: Diagnosis not present

## 2021-04-08 DIAGNOSIS — Z8616 Personal history of COVID-19: Secondary | ICD-10-CM | POA: Diagnosis not present

## 2021-04-08 NOTE — Progress Notes (Signed)
She cannot lay down EKG  Subjective:    Patient ID: Mia Parker, female    DOB: Jun 01, 1971, 50 y.o.   MRN: [id number]  HPI She is here for preoperative evaluation.  She is scheduled in the near future to have spinal stenosis surgery.  This occurred while she worked.  She has been through extensive evaluation including x-rays, MRI, PT, injections.  She is now on multiple medications to help control her discomfort.  She has had COVID twice in the past.  She has not had vaccinations for that. She has no underlying heart or lung disease.  She has not been sick recently with colds or other respiratory symptoms.  No history of chest pain or shortness of breath.  Review of Systems     Objective:   Physical Exam Alert and in no distress. Tympanic membranes and canals are normal. Pharyngeal area is normal. Neck is supple without adenopathy or thyromegaly. Cardiac exam shows a regular sinus rhythm without murmurs or gallops. Lungs are clear to auscultation. EKG read by me shows a rate of 86 with other parameters normal.  Totally normal EKG.       Assessment & Plan:  Preoperative evaluation to rule out surgical contraindication - Plan: EKG 12-Lead  Spinal stenosis at L4-L5 level  History of COVID-19 She is cleared for surgery.

## 2021-05-03 DIAGNOSIS — Z01812 Encounter for preprocedural laboratory examination: Secondary | ICD-10-CM | POA: Diagnosis not present

## 2021-05-03 DIAGNOSIS — M4316 Spondylolisthesis, lumbar region: Secondary | ICD-10-CM | POA: Diagnosis not present

## 2022-05-21 ENCOUNTER — Encounter: Payer: Self-pay | Admitting: Internal Medicine

## 2022-06-17 ENCOUNTER — Ambulatory Visit (INDEPENDENT_AMBULATORY_CARE_PROVIDER_SITE_OTHER): Payer: Medicaid Other | Admitting: Family Medicine

## 2022-06-17 ENCOUNTER — Encounter (INDEPENDENT_AMBULATORY_CARE_PROVIDER_SITE_OTHER): Payer: Self-pay | Admitting: Family Medicine

## 2022-06-17 VITALS — BP 133/96 | HR 77 | Temp 98.2°F | Ht 68.0 in | Wt 288.0 lb

## 2022-06-17 DIAGNOSIS — Z6841 Body Mass Index (BMI) 40.0 and over, adult: Secondary | ICD-10-CM

## 2022-06-17 DIAGNOSIS — F909 Attention-deficit hyperactivity disorder, unspecified type: Secondary | ICD-10-CM

## 2022-06-17 DIAGNOSIS — Z0289 Encounter for other administrative examinations: Secondary | ICD-10-CM

## 2022-06-17 DIAGNOSIS — M48061 Spinal stenosis, lumbar region without neurogenic claudication: Secondary | ICD-10-CM

## 2022-06-17 NOTE — Progress Notes (Signed)
Office: [phone]  /  Fax: [phone]  Initial Visit  Mia Parker was seen in clinic today to evaluate for obesity. She is interested in losing weight to improve overall health and reduce the risk of weight related complications.   She presents today to review program treatment options, initial physical assessment, and evaluation.     She has struggled with her weight since having her first child at age 51.  She has 2 daughters ages 51 and 27.  She had lumbar fusion done Sept 2022 and had previously worked a very active job for [employer].  She is in school for IT career.  She is able to do some walking for exercise but limits weight lifting due to back fusion.  She would like to get back down to 190 lb.  Past medical history includes:   Past Medical History:  Diagnosis Date   ADD (attention deficit disorder)    AMA (advanced maternal age) multigravida 35+    Diabetes mellitus without complication (HCC)    GDM with 2017 pregnancy   Hypertension    with 2017 pregnancy, no meds   Infertility, female    Obesity    Postpartum edema 06/10/2013   Sleep apnea    use CPAP nightly     Objective:   BP (!) 133/96   Pulse 77   Temp 98.2 F (36.8 C)   Ht 5\' 8"  (1.727 m)   Wt 288 lb (130.6 kg)   SpO2 99%   BMI 43.79 kg/m  She was weighed on the bioimpedance scale:  Body mass index is 43.79 kg/m.  General:  Alert, oriented and cooperative. Patient is in no acute distress.  Respiratory: Normal respiratory effort, no problems with respiration noted  Extremities: Normal range of motion.    Mental Status: Normal mood and affect. Normal behavior. Normal judgment and thought content.   Assessment and Plan:  1. Obesity, Class III, BMI 40-49.9 (morbid obesity) (HCC)  2. BMI 40.0-44.9, adult (HCC)  3. Attention deficit hyperactivity disorder (ADHD), unspecified ADHD type  4. Spinal stenosis of lumbar region, unspecified whether neurogenic claudication present      Obesity Treatment  Plan:  She will work on garnering support from family and friends to begin weight loss journey. Work on eliminating or reducing the presence of highly processed, calorie dense foods in the home. Complete provided nutritional and psychosocial assessment questionnaire.    Mia Parker will follow up in the next 1-2 weeks to review the above steps and continue evaluation and treatment.  Obesity Education Performed Today:  She was weighed on the bioimpedance scale and results were discussed and documented in the synopsis.  We discussed obesity as a disease and the importance of a more detailed evaluation of all the factors contributing to the disease.  We discussed the importance of long term lifestyle changes which include nutrition, exercise and behavioral modifications as well as the importance of customizing this to her specific health and social needs.  We discussed the benefits of reaching a healthier weight to alleviate the symptoms of existing conditions and reduce the risks of the biomechanical, metabolic and psychological effects of obesity.  We discussed the goals of this program is to improve her overall health and not simply achieve a specific BMI.  Frequent visits are very important to patient success. I plan to see her every 2 weeks for the first 3 months and then evaluate the visit frequency after that time. I explained obesity is a life-long  chronic disease and long term treatments would be required. Medications to help her follow his eating plan may be offered as appropriate but are not required. All medication decisions will be made together after the initial workup is done and benefits and side effects are discussed in depth.  The clinic rules were reviewed including the late policy, cancellation policy, no show and program fees.  Mia Parker appears to be in the action stage of change and states they are ready to start intensive lifestyle modifications and behavioral  modifications.  30  minutes was spent today on this visit including the above counseling, pre-visit chart review, and post-visit documentation.  Dell Ponto, DO

## 2022-06-24 ENCOUNTER — Encounter: Payer: Self-pay | Admitting: Internal Medicine

## 2022-06-24 ENCOUNTER — Encounter (INDEPENDENT_AMBULATORY_CARE_PROVIDER_SITE_OTHER): Payer: BC Managed Care – PPO | Admitting: Family Medicine

## 2022-07-02 ENCOUNTER — Ambulatory Visit (INDEPENDENT_AMBULATORY_CARE_PROVIDER_SITE_OTHER): Payer: Medicaid Other | Admitting: Family Medicine

## 2022-07-02 ENCOUNTER — Encounter (INDEPENDENT_AMBULATORY_CARE_PROVIDER_SITE_OTHER): Payer: Self-pay | Admitting: Family Medicine

## 2022-07-02 VITALS — BP 108/70 | HR 70 | Temp 97.8°F | Ht 68.0 in | Wt 288.0 lb

## 2022-07-02 DIAGNOSIS — M47816 Spondylosis without myelopathy or radiculopathy, lumbar region: Secondary | ICD-10-CM | POA: Diagnosis not present

## 2022-07-02 DIAGNOSIS — R5383 Other fatigue: Secondary | ICD-10-CM

## 2022-07-02 DIAGNOSIS — F908 Attention-deficit hyperactivity disorder, other type: Secondary | ICD-10-CM

## 2022-07-02 DIAGNOSIS — F32A Depression, unspecified: Secondary | ICD-10-CM | POA: Insufficient documentation

## 2022-07-02 DIAGNOSIS — G4733 Obstructive sleep apnea (adult) (pediatric): Secondary | ICD-10-CM | POA: Diagnosis not present

## 2022-07-02 DIAGNOSIS — R0602 Shortness of breath: Secondary | ICD-10-CM

## 2022-07-02 DIAGNOSIS — F3289 Other specified depressive episodes: Secondary | ICD-10-CM

## 2022-07-02 DIAGNOSIS — Z6841 Body Mass Index (BMI) 40.0 and over, adult: Secondary | ICD-10-CM

## 2022-07-02 DIAGNOSIS — E669 Obesity, unspecified: Secondary | ICD-10-CM

## 2022-07-02 DIAGNOSIS — F988 Other specified behavioral and emotional disorders with onset usually occurring in childhood and adolescence: Secondary | ICD-10-CM | POA: Insufficient documentation

## 2022-07-03 LAB — COMPREHENSIVE METABOLIC PANEL
ALT: 17 IU/L (ref 0–32)
AST: 20 IU/L (ref 0–40)
Albumin/Globulin Ratio: 1.7 (ref 1.2–2.2)
Albumin: 4.7 g/dL (ref 3.9–4.9)
Alkaline Phosphatase: 108 IU/L (ref 44–121)
BUN/Creatinine Ratio: 10 (ref 9–23)
BUN: 8 mg/dL (ref 6–24)
Bilirubin Total: 0.6 mg/dL (ref 0.0–1.2)
CO2: 22 mmol/L (ref 20–29)
Calcium: 9.6 mg/dL (ref 8.7–10.2)
Chloride: 101 mmol/L (ref 96–106)
Creatinine, Ser: 0.8 mg/dL (ref 0.57–1.00)
Globulin, Total: 2.8 g/dL (ref 1.5–4.5)
Glucose: 106 mg/dL — ABNORMAL HIGH (ref 70–99)
Potassium: 4.3 mmol/L (ref 3.5–5.2)
Sodium: 138 mmol/L (ref 134–144)
Total Protein: 7.5 g/dL (ref 6.0–8.5)
eGFR: 90 mL/min/{1.73_m2} (ref >59)

## 2022-07-03 LAB — CBC WITH DIFFERENTIAL/PLATELET
Basophils Absolute: 0.2 10*3/uL (ref 0.0–0.2)
Basos: 2 %
EOS (ABSOLUTE): 1.2 10*3/uL — ABNORMAL HIGH (ref 0.0–0.4)
Eos: 13 %
Hematocrit: 45.2 % (ref 34.0–46.6)
Hemoglobin: 15.3 g/dL (ref 11.1–15.9)
Immature Grans (Abs): 0 10*3/uL (ref 0.0–0.1)
Immature Granulocytes: 0 %
Lymphocytes Absolute: 1.9 10*3/uL (ref 0.7–3.1)
Lymphs: 21 %
MCH: 28.6 pg (ref 26.6–33.0)
MCHC: 33.8 g/dL (ref 31.5–35.7)
MCV: 85 fL (ref 79–97)
Monocytes Absolute: 0.8 10*3/uL (ref 0.1–0.9)
Monocytes: 8 %
Neutrophils Absolute: 5.1 10*3/uL (ref 1.4–7.0)
Neutrophils: 56 %
Platelets: 335 10*3/uL (ref 150–450)
RBC: 5.35 x10E6/uL — ABNORMAL HIGH (ref 3.77–5.28)
RDW: 13.1 % (ref 11.7–15.4)
WBC: 9.2 10*3/uL (ref 3.4–10.8)

## 2022-07-03 LAB — VITAMIN D 25 HYDROXY (VIT D DEFICIENCY, FRACTURES): Vit D, 25-Hydroxy: 19 ng/mL — ABNORMAL LOW (ref 30.0–100.0)

## 2022-07-03 LAB — FOLATE: Folate: 6.4 ng/mL (ref >3.0)

## 2022-07-03 LAB — VITAMIN B12: Vitamin B-12: 408 pg/mL (ref 232–1245)

## 2022-07-03 LAB — T4, FREE: Free T4: 0.94 ng/dL (ref 0.82–1.77)

## 2022-07-03 LAB — INSULIN, RANDOM: INSULIN: 16.7 u[IU]/mL (ref 2.6–24.9)

## 2022-07-03 LAB — HEMOGLOBIN A1C
Est. average glucose Bld gHb Est-mCnc: 120 mg/dL
Hgb A1c MFr Bld: 5.8 % — ABNORMAL HIGH (ref 4.8–5.6)

## 2022-07-03 LAB — LIPID PANEL
Chol/HDL Ratio: 2.6 ratio (ref 0.0–4.4)
Cholesterol, Total: 235 mg/dL — ABNORMAL HIGH (ref 100–199)
HDL: 89 mg/dL (ref >39)
LDL Chol Calc (NIH): 129 mg/dL — ABNORMAL HIGH (ref 0–99)
Triglycerides: 97 mg/dL (ref 0–149)
VLDL Cholesterol Cal: 17 mg/dL (ref 5–40)

## 2022-07-03 LAB — TSH: TSH: 2.24 u[IU]/mL (ref 0.450–4.500)

## 2022-07-07 ENCOUNTER — Encounter: Payer: Self-pay | Admitting: Internal Medicine

## 2022-07-14 NOTE — Progress Notes (Unsigned)
Chief Complaint:   Mia Parker (MR# [id number]) is a 51 y.o. female who presents for evaluation and treatment of Mia and related comorbidities. Current BMI is Body mass index is 43.79 kg/m. Mia Parker has been struggling with her weight for many years and has been unsuccessful in either losing weight, maintaining weight loss, or reaching her healthy weight goal.  She would like to lose 100 lbs.  She sometimes skips lunch.  She tends to eat bigger portions if drinking diet coke.  She tends to snack on sweets, boredom and stress eating.  She averaged 180-200 lbs in her 30's.  She used to work an active job for [employer], but Parker lumbar fusion.  She drinks mostly diet coke, water and some sweet tea.  Mia Parker is currently in the action stage of change and ready to dedicate time achieving and maintaining a healthier weight. Mia Parker is interested in becoming our patient and working on intensive lifestyle modifications including (but not limited to) diet and exercise for weight loss.  Mia Parker's habits were reviewed today and are as follows: Her family eats meals together, she thinks her family will eat healthier with her, her desired weight loss is 108 lbs, she started gaining weight in her 40's, her heaviest weight ever was 288 pounds, she has significant food cravings issues, she skips meals frequently, she is frequently drinking liquids with calories, she frequently makes poor food choices, she has problems with excessive hunger, she frequently eats larger portions than normal, and she struggles with emotional eating.  Depression Screen Mia Parker's Food and Mood (modified PHQ-9) score was 18.     07/02/2022    8:04 AM  Depression screen PHQ 2/9  Decreased Interest 3  Down, Depressed, Hopeless 3  PHQ - 2 Score 6  Altered sleeping 0  Tired, decreased energy 3  Change in appetite 3  Feeling bad or failure about yourself  2  Trouble concentrating 3  Moving slowly or fidgety/restless 1   Suicidal thoughts 0  PHQ-9 Score 18  Difficult doing work/chores Very difficult   Subjective:   1. Other fatigue Mia Parker to daytime somnolence and Parker to waking up still tired. Patient has a history of symptoms of morning headache and but uses a CPAP machine. Mia Parker generally gets 8 hours of sleep per night, and states that she has generally restful sleep. Snoring is present. Apneic episodes are present. Epworth Sleepiness Score is 6.   Reviewed EKG, IC test today.   2. SOBOE (shortness of breath on exertion) Mia Parker notes increasing shortness of breath with exercising and seems to be worsening over time with weight gain. She notes getting out of breath sooner with activity than she used to. This has not gotten worse recently. Mia Parker denies shortness of breath at rest or orthopnea.  3. Attention deficit hyperactivity disorder (ADHD), other type On Adderall 20 mg TID, has run out of her current prescription.  4. OSA on CPAP Epworth sleepiness score 6.  She wears a CPAP and gets 8-9 hours of sleep at night.    5. Lumbar spondylosis S/P Lumbar fusion.  She is on gabapentin 300 mg TID prn and Celebrex daily.  She is able to walk without pain.   6. Other depression with emotional eating History of anxiety and depression, no longer on treatment.  PHQ9:18.  Took Wellbutrin in the past that helped.    Assessment/Plan:   1. Other fatigue Joselynn does feel that her weight is causing her energy to  be lower than it should be. Fatigue may be related to Mia, depression or many other causes. Labs will be ordered, and in the meanwhile, Mia Parker will focus on self care including making healthy food choices, increasing physical activity and focusing on stress reduction.  Update labs today.   - EKG 12-Lead - VITAMIN D 25 Hydroxy (Vit-D Deficiency, Fractures) - TSH - T4, free - Lipid panel - Insulin, random - Hemoglobin A1c - Folate - Comprehensive metabolic panel - Vitamin B12 -  CBC with Differential/Platelet  2. SOBOE (shortness of breath on exertion) Mia Parker does feel that she gets out of breath more easily that she used to when she exercises. Mia Parker's shortness of breath appears to be Mia related and exercise induced. She has agreed to work on weight loss and gradually increase exercise to treat her exercise induced shortness of breath. Will continue to monitor closely.  3. Attention deficit hyperactivity disorder (ADHD), other type Avoid use of any stimulant anti-Mia medications.   4. OSA on CPAP Continue CPAP nightly and begin active plan for weight loss.   5. Lumbar spondylosis Begin active plan for weight loss and focus on walking time.   6. Other depression with emotional eating Mia Parker a positive depression screening. Depression is commonly associated with Mia and often results in emotional eating behaviors. We will monitor this closely and work on CBT to help improve the non-hunger eating patterns. Referral to Psychology may be required if no improvement is seen as she continues in our clinic.   7. Mia,current BMI 43.9 Consider using Wellbutrin in the future.   Mia Parker is currently in the action stage of change and her goal is to continue with weight loss efforts. I recommend Mia Parker begin the structured treatment plan as follows:  She has agreed to the Category 3 Plan.  Exercise goals:  Increase walking to 30 minutes 5 times per week.     Behavioral modification strategies: increasing lean protein intake, decreasing simple carbohydrates, increasing vegetables, decreasing eating out, no skipping meals, meal planning and cooking strategies, and decreasing junk food.  She was informed of the importance of frequent follow-up visits to maximize her success with intensive lifestyle modifications for her multiple health conditions. She was informed we would discuss her lab results at her next visit unless there is a critical issue that needs  to be addressed sooner. Mia Parker agreed to keep her next visit at the agreed upon time to discuss these results.  Objective:   Blood pressure 108/70, pulse 70, temperature 97.8 F (36.6 C), height 5\' 8"  (1.727 m), weight 288 lb (130.6 kg), SpO2 98 %. Body mass index is 43.79 kg/m.  EKG: Normal sinus rhythm, rate 76 BPM.  Indirect Calorimeter completed today shows a VO2 of 321 and a REE of 2218.  Her calculated basal metabolic rate is 2219 thus her basal metabolic rate is worse than expected.  General: Cooperative, alert, well developed, in no acute distress. HEENT: Conjunctivae and lids unremarkable. Cardiovascular: Regular rhythm.  Lungs: Normal work of breathing. Neurologic: No focal deficits.   Lab Results  Component Value Date   CREATININE 0.80 07/02/2022   BUN 8 07/02/2022   NA 138 07/02/2022   K 4.3 07/02/2022   CL 101 07/02/2022   CO2 22 07/02/2022   Lab Results  Component Value Date   ALT 17 07/02/2022   AST 20 07/02/2022   ALKPHOS 108 07/02/2022   BILITOT 0.6 07/02/2022   Lab Results  Component Value Date   HGBA1C  5.8 (H) 07/02/2022   HGBA1C 5.7 (A) 02/22/2020   HGBA1C 6.0 (H) 10/02/2017   Lab Results  Component Value Date   INSULIN 16.7 07/02/2022   Lab Results  Component Value Date   TSH 2.240 07/02/2022   Lab Results  Component Value Date   CHOL 235 (H) 07/02/2022   HDL 89 07/02/2022   LDLCALC 129 (H) 07/02/2022   TRIG 97 07/02/2022   CHOLHDL 2.6 07/02/2022   Lab Results  Component Value Date   WBC 9.2 07/02/2022   HGB 15.3 07/02/2022   HCT 45.2 07/02/2022   MCV 85 07/02/2022   PLT 335 07/02/2022   No results found for: "IRON", "TIBC", "FERRITIN"  Attestation Statements:   Reviewed by clinician on day of visit: allergies, medications, problem list, medical history, surgical history, family history, social history, and previous encounter notes.  Time spent on visit including pre-visit chart review and post-visit care and charting was 45  minutes.   I, Davy Pique, am acting as Location manager for Loyal Gambler, DO.  I have reviewed the above documentation for accuracy and completeness, and I agree with the above. Dell Ponto, DO

## 2022-07-15 ENCOUNTER — Ambulatory Visit (INDEPENDENT_AMBULATORY_CARE_PROVIDER_SITE_OTHER): Payer: Medicaid Other | Admitting: Family Medicine

## 2022-07-15 ENCOUNTER — Encounter (INDEPENDENT_AMBULATORY_CARE_PROVIDER_SITE_OTHER): Payer: Self-pay | Admitting: Family Medicine

## 2022-07-15 VITALS — BP 134/64 | HR 83 | Temp 97.7°F | Ht 68.0 in | Wt 286.0 lb

## 2022-07-15 DIAGNOSIS — G4733 Obstructive sleep apnea (adult) (pediatric): Secondary | ICD-10-CM

## 2022-07-15 DIAGNOSIS — M47816 Spondylosis without myelopathy or radiculopathy, lumbar region: Secondary | ICD-10-CM | POA: Diagnosis not present

## 2022-07-15 DIAGNOSIS — Z6841 Body Mass Index (BMI) 40.0 and over, adult: Secondary | ICD-10-CM | POA: Diagnosis not present

## 2022-07-15 DIAGNOSIS — E669 Obesity, unspecified: Secondary | ICD-10-CM

## 2022-07-15 DIAGNOSIS — E559 Vitamin D deficiency, unspecified: Secondary | ICD-10-CM | POA: Diagnosis not present

## 2022-07-15 DIAGNOSIS — F908 Attention-deficit hyperactivity disorder, other type: Secondary | ICD-10-CM

## 2022-07-15 DIAGNOSIS — M48061 Spinal stenosis, lumbar region without neurogenic claudication: Secondary | ICD-10-CM

## 2022-07-15 DIAGNOSIS — R7303 Prediabetes: Secondary | ICD-10-CM

## 2022-07-15 MED ORDER — CONTRAVE 8-90 MG PO TB12: ORAL_TABLET | ORAL | 0 refills | Status: DC

## 2022-07-15 MED ORDER — VITAMIN D (ERGOCALCIFEROL) 1.25 MG (50000 UNIT) PO CAPS: 50000.0000 [IU] | ORAL_CAPSULE | ORAL | 0 refills | Status: DC

## 2022-07-28 NOTE — Progress Notes (Unsigned)
Chief Complaint:   OBESITY Mia Parker is here to discuss her progress with her obesity treatment plan along with follow-up of her obesity related diagnoses. [name] is on the Category 3 Plan and states she is following her eating plan approximately 80% of the time. [name] states she is walking 20 minutes 5 times per week.  Today's visit was #: 2 Starting weight: 288 lbs Starting date: 07/02/2022 Today's weight: 286 lbs Today's date: 07/15/2022 Total lbs lost to date: 2 lbs Total lbs lost since last in-office visit: 2 lbs  Interim History: Struggling with dinner planning.  Denies meal skipping.  Does well with breakfast and lunch daily.  She denies hunger usually.  Increased water intake.  Has more sugar cravings.  Walking outside more. Has emotional eating history.   Subjective:   1. OSA on CPAP She aims for 8 hours of sleeping, she using CPAP.   2. Lumbar spondylosis Has not been taking gabapentin daily.  Using Celebrex 200 mg daily, it is helping.   3. Attention deficit hyperactivity disorder (ADHD), other type She is on Adderall 20 mg TID currently out of prescription.   4. Vitamin D deficiency Discussed labs with patient today. Vitamin D level 19, not taking a Vitamin D supplement.  5. Pre-diabetes Discussed labs with patient today. A1c 5.8, fasting insulin 16.7.  She is working on reducing intake of sugar and starches.   Assessment/Plan:   1. OSA on CPAP Continue to wear CPAP nightly. Look for improvements with weight loss.    2. Lumbar spondylosis Back pain is not limiting walking, please continue.   3. Attention deficit hyperactivity disorder (ADHD), other type Avoid use of phentermine and Qsymia. Follow up with PCP for refills.   4. Vitamin D deficiency Low Vitamin D level contributes to fatigue and are associated with obesity, breast, and colon cancer. She agrees to continue to take prescription Vitamin D @50 ,000 IU every week and will follow-up for routine  testing of Vitamin D, at least 2-3 times per year to avoid over-replacement.  Begin - Vitamin D, Ergocalciferol, (DRISDOL) 1.25 MG (50000 UNIT) CAPS capsule; Take 1 capsule (50,000 Units total) by mouth every 7 (seven) days.  Dispense: 5 capsule; Refill: 0  5. Pre-diabetes Continue healthy lifestyle changes. Consider adding metformin.   Begin- Naltrexone-buPROPion HCl ER (CONTRAVE) 8-90 MG TB12; 2 tabs po with breakfast and 2 tabs po with dinner daily  Dispense: 120 tablet; Refill: 0  6. Obesity,current BMI 43.6 Use mail order pharmacy due to lack of insurance coverage.  Reviewed MOA and potential adverse side effects.   Begin- Naltrexone-buPROPion HCl ER (CONTRAVE) 8-90 MG TB12; 2 tabs po with breakfast and 2 tabs po with dinner daily  Dispense: 120 tablet; Refill: 0  Mia Parker is currently in the action stage of change. As such, her goal is to continue with weight loss efforts. She has agreed to the Category 3 Plan.   Exercise goals: All adults should avoid inactivity. Some physical activity is better than none, and adults who participate in any amount of physical activity gain some health benefits.  Behavioral modification strategies: increasing lean protein intake, increasing vegetables, increasing water intake, increasing high fiber foods, decreasing eating out, no skipping meals, meal planning and cooking strategies, keeping healthy foods in the home, and planning for success.  Mia Parker has agreed to follow-up with our clinic in 3 weeks. She was informed of the importance of frequent follow-up visits to maximize her success with intensive lifestyle modifications for her  multiple health conditions.   Objective:   Blood pressure 134/64, pulse 83, temperature 97.7 F (36.5 C), height 5\' 8"  (1.727 m), weight 286 lb (129.7 kg), SpO2 100 %. Body mass index is 43.49 kg/m.  General: Cooperative, alert, well developed, in no acute distress. HEENT: Conjunctivae and lids  unremarkable. Cardiovascular: Regular rhythm.  Lungs: Normal work of breathing. Neurologic: No focal deficits.   Lab Results  Component Value Date   CREATININE 0.80 07/02/2022   BUN 8 07/02/2022   NA 138 07/02/2022   K 4.3 07/02/2022   CL 101 07/02/2022   CO2 22 07/02/2022   Lab Results  Component Value Date   ALT 17 07/02/2022   AST 20 07/02/2022   ALKPHOS 108 07/02/2022   BILITOT 0.6 07/02/2022   Lab Results  Component Value Date   HGBA1C 5.8 (H) 07/02/2022   HGBA1C 5.7 (A) 02/22/2020   HGBA1C 6.0 (H) 10/02/2017   Lab Results  Component Value Date   INSULIN 16.7 07/02/2022   Lab Results  Component Value Date   TSH 2.240 07/02/2022   Lab Results  Component Value Date   CHOL 235 (H) 07/02/2022   HDL 89 07/02/2022   LDLCALC 129 (H) 07/02/2022   TRIG 97 07/02/2022   CHOLHDL 2.6 07/02/2022   Lab Results  Component Value Date   VD25OH 19.0 (L) 07/02/2022   Lab Results  Component Value Date   WBC 9.2 07/02/2022   HGB 15.3 07/02/2022   HCT 45.2 07/02/2022   MCV 85 07/02/2022   PLT 335 07/02/2022   No results found for: "IRON", "TIBC", "FERRITIN"  Attestation Statements:   Reviewed by clinician on day of visit: allergies, medications, problem list, medical history, surgical history, family history, social history, and previous encounter notes.  I, 07/04/2022, am acting as [place] for Energy manager, DO.  I have reviewed the above documentation for accuracy and completeness, and I agree with the above. Seymour Bars, DO

## 2022-08-04 NOTE — Progress Notes (Unsigned)
TeleHealth Visit:  This visit was completed with telemedicine (audio/video) technology. Mia Parker has verbally consented to this TeleHealth visit. The patient is located at home, the provider is located at home. The participants in this visit include the listed provider and patient. The visit was conducted today via MyChart video.  OBESITY Mia Parker is here to discuss her progress with her obesity treatment plan along with follow-up of her obesity related diagnoses.   Today's visit was # 3 Starting weight: 288 lbs Starting date: 07/02/2022 Weight at last in office visit: 286 lbs on 07/15/22 Total weight loss: 2 lbs at last in office visit on 07/15/22. Today's reported weight: *** lbs No weight reported.  Nutrition Plan: the Category 3 Plan.   Current exercise: {exercise types:16438} walking 20 minutes 5 times per week.  Interim History: ***  Assessment/Plan:  1. ***  2. ***  3. ***  Obesity: Current BMI *** [name] {CHL AMB IS/IS NOT:210130109} currently in the action stage of change. As such, her goal is to {MWMwtloss#1:210800005}.  She has agreed to {MWMwtlossportion/plan2:23431}.   Exercise goals: {MWM EXERCISE RECS:23473}  Behavioral modification strategies: {MWMwtlossdietstrategies3:23432}.  [name] has agreed to follow-up with our clinic in {NUMBER 1-10:22536} weeks.   No orders of the defined types were placed in this encounter.   There are no discontinued medications.   No orders of the defined types were placed in this encounter.     Objective:   VITALS: Per patient if applicable, see vitals. GENERAL: Alert and in no acute distress. CARDIOPULMONARY: No increased WOB. Speaking in clear sentences.  PSYCH: Pleasant and cooperative. Speech normal rate and rhythm. Affect is appropriate. Insight and judgement are appropriate. Attention is focused, linear, and appropriate.  NEURO: Oriented as arrived to appointment on time with no prompting.   Lab Results   Component Value Date   CREATININE 0.80 07/02/2022   BUN 8 07/02/2022   NA 138 07/02/2022   K 4.3 07/02/2022   CL 101 07/02/2022   CO2 22 07/02/2022   Lab Results  Component Value Date   ALT 17 07/02/2022   AST 20 07/02/2022   ALKPHOS 108 07/02/2022   BILITOT 0.6 07/02/2022   Lab Results  Component Value Date   HGBA1C 5.8 (H) 07/02/2022   HGBA1C 5.7 (A) 02/22/2020   HGBA1C 6.0 (H) 10/02/2017   Lab Results  Component Value Date   INSULIN 16.7 07/02/2022   Lab Results  Component Value Date   TSH 2.240 07/02/2022   Lab Results  Component Value Date   CHOL 235 (H) 07/02/2022   HDL 89 07/02/2022   LDLCALC 129 (H) 07/02/2022   TRIG 97 07/02/2022   CHOLHDL 2.6 07/02/2022   Lab Results  Component Value Date   WBC 9.2 07/02/2022   HGB 15.3 07/02/2022   HCT 45.2 07/02/2022   MCV 85 07/02/2022   PLT 335 07/02/2022   No results found for: "IRON", "TIBC", "FERRITIN" Lab Results  Component Value Date   VD25OH 19.0 (L) 07/02/2022    Attestation Statements:   Reviewed by clinician on day of visit: allergies, medications, problem list, medical history, surgical history, family history, social history, and previous encounter notes.  ***(delete if time-based billing not used) Time spent on visit including the items listed below was *** minutes.  -preparing to see the patient (e.g., review of tests, history, previous notes) -obtaining and/or reviewing separately obtained history -counseling and educating the patient/family/caregiver -documenting clinical information in the electronic or other health record -ordering medications, tests, or procedures -  independently interpreting results and communicating results to the patient/ family/caregiver -referring and communicating with other health care professionals  -care coordination

## 2022-08-05 ENCOUNTER — Telehealth (INDEPENDENT_AMBULATORY_CARE_PROVIDER_SITE_OTHER): Payer: Medicaid Other | Admitting: Family Medicine

## 2022-08-05 ENCOUNTER — Encounter (INDEPENDENT_AMBULATORY_CARE_PROVIDER_SITE_OTHER): Payer: Self-pay | Admitting: Family Medicine

## 2022-08-05 DIAGNOSIS — E559 Vitamin D deficiency, unspecified: Secondary | ICD-10-CM | POA: Diagnosis not present

## 2022-08-05 DIAGNOSIS — R632 Polyphagia: Secondary | ICD-10-CM | POA: Diagnosis not present

## 2022-08-05 DIAGNOSIS — Z6841 Body Mass Index (BMI) 40.0 and over, adult: Secondary | ICD-10-CM | POA: Diagnosis not present

## 2022-08-05 DIAGNOSIS — E669 Obesity, unspecified: Secondary | ICD-10-CM | POA: Diagnosis not present

## 2022-08-18 ENCOUNTER — Encounter (INDEPENDENT_AMBULATORY_CARE_PROVIDER_SITE_OTHER): Payer: Self-pay | Admitting: Family Medicine

## 2022-08-18 ENCOUNTER — Ambulatory Visit (INDEPENDENT_AMBULATORY_CARE_PROVIDER_SITE_OTHER): Payer: Medicaid Other | Admitting: Family Medicine

## 2022-08-18 VITALS — BP 121/80 | HR 83 | Temp 97.7°F | Ht 68.0 in | Wt 282.0 lb

## 2022-08-18 DIAGNOSIS — E669 Obesity, unspecified: Secondary | ICD-10-CM

## 2022-08-18 DIAGNOSIS — Z6841 Body Mass Index (BMI) 40.0 and over, adult: Secondary | ICD-10-CM | POA: Diagnosis not present

## 2022-08-18 DIAGNOSIS — R632 Polyphagia: Secondary | ICD-10-CM | POA: Diagnosis not present

## 2022-08-18 DIAGNOSIS — E559 Vitamin D deficiency, unspecified: Secondary | ICD-10-CM

## 2022-08-18 MED ORDER — VITAMIN D (ERGOCALCIFEROL) 1.25 MG (50000 UNIT) PO CAPS: 50000.0000 [IU] | ORAL_CAPSULE | ORAL | 0 refills | Status: DC

## 2022-08-18 MED ORDER — CONTRAVE 8-90 MG PO TB12: ORAL_TABLET | ORAL | 0 refills | Status: DC

## 2022-08-26 NOTE — Progress Notes (Signed)
Chief Complaint:   OBESITY Mia Parker is here to discuss her progress with her obesity treatment plan along with follow-up of her obesity related diagnoses. Mia Parker is on the Category 3 Plan and states she is following her eating plan approximately 50% of the time. Mia Parker states she is walking 20-30 minutes 5 times per week.  Today's visit was #: 4 Starting weight: 288 LBS Starting date: 04/01/2022 Today's weight: 282 LBS Today's date: 08/18/2022 Total lbs lost to date: 6 LBS Total lbs lost since last in-office visit: 4 LBS  Interim History: Patient admits to getting plan 1 week with Thanksgiving.  She had a more walking and plans to restart yoga.  She is getting 3 meals and, fruits and veggies.  She has been practicing mindful eating and portion control.  She is enjoying improved satiety on Contrave.  Subjective:   1. Polyphagia Appetite has been improving on Contrave.  She is currently on 2 tabs with breakfast and 1 tab with dinner.  She has an increase in jaw clenching, not sure if it is related.  2. Vitamin D deficiency 07/02/2022, vitamin D level at 19.  She is on prescription vitamin D 50,000 IU every week.  Energy level is improving.  Assessment/Plan:   1. Polyphagia Refill - Naltrexone-buPROPion HCl ER (CONTRAVE) 8-90 MG TB12; 2 tabs po with breakfast and 2 tabs po with dinner daily  Dispense: 120 tablet; Refill: 0  2. Vitamin D deficiency Refill- Vitamin D, Ergocalciferol, (DRISDOL) 1.25 MG (50000 UNIT) CAPS capsule; Take 1 capsule (50,000 Units total) by mouth every 7 (seven) days.  Dispense: 5 capsule; Refill: 0  3. Obesity,current BMI 43.0 1.  Increase protein intake to 100 g/day. 2.  Increase exercise to 30 minutes 4-5 times per week.  Mia Parker is currently in the action stage of change. As such, her goal is to continue with weight loss efforts. She has agreed to the Category 3 Plan +100 protein daily.  Exercise goals:  30 minutes 4-5 times per week  Behavioral  modification strategies: increasing lean protein intake, increasing water intake, decreasing eating out, no skipping meals, meal planning and cooking strategies, keeping healthy foods in the home, better snacking choices, and planning for success.  Mia Parker has agreed to follow-up with our clinic in 2 weeks. She was informed of the importance of frequent follow-up visits to maximize her success with intensive lifestyle modifications for her multiple health conditions.   Objective:   Blood pressure 121/80, pulse 83, temperature 97.7 F (36.5 C), height 5\' 8"  (1.727 m), weight 282 lb (127.9 kg), SpO2 100 %. Body mass index is 42.88 kg/m.  General: Cooperative, alert, well developed, in no acute distress. HEENT: Conjunctivae and lids unremarkable. Cardiovascular: Regular rhythm.  Lungs: Normal work of breathing. Neurologic: No focal deficits.   Lab Results  Component Value Date   CREATININE 0.80 07/02/2022   BUN 8 07/02/2022   NA 138 07/02/2022   K 4.3 07/02/2022   CL 101 07/02/2022   CO2 22 07/02/2022   Lab Results  Component Value Date   ALT 17 07/02/2022   AST 20 07/02/2022   ALKPHOS 108 07/02/2022   BILITOT 0.6 07/02/2022   Lab Results  Component Value Date   HGBA1C 5.8 (H) 07/02/2022   HGBA1C 5.7 (A) 02/22/2020   HGBA1C 6.0 (H) 10/02/2017   Lab Results  Component Value Date   INSULIN 16.7 07/02/2022   Lab Results  Component Value Date   TSH 2.240 07/02/2022   Lab  Results  Component Value Date   CHOL 235 (H) 07/02/2022   HDL 89 07/02/2022   LDLCALC 129 (H) 07/02/2022   TRIG 97 07/02/2022   CHOLHDL 2.6 07/02/2022   Lab Results  Component Value Date   VD25OH 19.0 (L) 07/02/2022   Lab Results  Component Value Date   WBC 9.2 07/02/2022   HGB 15.3 07/02/2022   HCT 45.2 07/02/2022   MCV 85 07/02/2022   PLT 335 07/02/2022   No results found for: "IRON", "TIBC", "FERRITIN"  Attestation Statements:   Reviewed by clinician on day of visit: allergies,  medications, problem list, medical history, surgical history, family history, social history, and previous encounter notes.  I, Malcolm Metro, am acting as Energy manager for Seymour Bars, DO.  I have reviewed the above documentation for accuracy and completeness, and I agree with the above. Glennis Brink, DO

## 2022-09-01 NOTE — Progress Notes (Signed)
TeleHealth Visit:  This visit was completed with telemedicine (audio/video) technology. Kenetra has verbally consented to this TeleHealth visit. The patient is located at home, the provider is located at home. The participants in this visit include the listed provider and patient. The visit was conducted today via MyChart video.  OBESITY Damyra is here to discuss her progress with her obesity treatment plan along with follow-up of her obesity related diagnoses.   Today's visit was # 5 Starting weight: 288 lbs Starting date: 04/01/22 Weight at last in office visit: 282 lbs on 08/18/22 Total weight loss: 6 lbs at last in office visit on 08/18/22. Today's reported weight: s No weight reported.  Nutrition Plan: the Category 3 Plan plus 100 calories-60% adherence  Interim History:  Robena reports compounding factors that has made it difficult to stick to the plan during the holiday season such as more social activities and family bringing sweets.  She is doing the best she can.  No intake of sugar sweetened beverages. Her family is traveling to the mountains of [place] to see her sister on December 27.  It is her kids first plane trip.  Staying at airbnb in the hopes to cook most meals other than eat out.  Assessment/Plan:  1. Polyphagia Currently on Contrave 2 pills in a.m. and 1 pill in p.m.  Appetite well-controlled.  Denies side effects other than jaw clenching.  She is unsure if this is related.  Plan: Contrave-May increase to 2 pills twice daily if appetite increases.  2. Vitamin D Deficiency Vitamin D is not at goal of 50.  She is on weekly prescription Vitamin D 50,000 IU.  Lab Results  Component Value Date   VD25OH 19.0 (L) 07/02/2022    Plan: Continue prescription vitamin D 50,000 IU weekly. Check vitamin D level in 1 to 2 months.   3. Obesity: Current BMI 42.9 Dynesha is currently in the action stage of change. As such, her goal is to continue with weight loss  efforts.  She has agreed to the Category 3 Plan + 100   Exercise goals: Walk as much as possible.  Behavioral modification strategies: increasing lean protein intake, decreasing simple carbohydrates, meal planning and cooking strategies, travel eating strategies, and planning for success.  Sanoe has agreed to follow-up with our clinic in 3 weeks.   No orders of the defined types were placed in this encounter.   There are no discontinued medications.   No orders of the defined types were placed in this encounter.     Objective:   VITALS: Per patient if applicable, see vitals. GENERAL: Alert and in no acute distress. CARDIOPULMONARY: No increased WOB. Speaking in clear sentences.  PSYCH: Pleasant and cooperative. Speech normal rate and rhythm. Affect is appropriate. Insight and judgement are appropriate. Attention is focused, linear, and appropriate.  NEURO: Oriented as arrived to appointment on time with no prompting.   Lab Results  Component Value Date   CREATININE 0.80 07/02/2022   BUN 8 07/02/2022   NA 138 07/02/2022   K 4.3 07/02/2022   CL 101 07/02/2022   CO2 22 07/02/2022   Lab Results  Component Value Date   ALT 17 07/02/2022   AST 20 07/02/2022   ALKPHOS 108 07/02/2022   BILITOT 0.6 07/02/2022   Lab Results  Component Value Date   HGBA1C 5.8 (H) 07/02/2022   HGBA1C 5.7 (A) 02/22/2020   HGBA1C 6.0 (H) 10/02/2017   Lab Results  Component Value Date   INSULIN 16.7  07/02/2022   Lab Results  Component Value Date   TSH 2.240 07/02/2022   Lab Results  Component Value Date   CHOL 235 (H) 07/02/2022   HDL 89 07/02/2022   LDLCALC 129 (H) 07/02/2022   TRIG 97 07/02/2022   CHOLHDL 2.6 07/02/2022   Lab Results  Component Value Date   WBC 9.2 07/02/2022   HGB 15.3 07/02/2022   HCT 45.2 07/02/2022   MCV 85 07/02/2022   PLT 335 07/02/2022   No results found for: "IRON", "TIBC", "FERRITIN" Lab Results  Component Value Date   VD25OH 19.0 (L) 07/02/2022     Attestation Statements:   Reviewed by clinician on day of visit: allergies, medications, problem list, medical history, surgical history, family history, social history, and previous encounter notes.

## 2022-09-02 ENCOUNTER — Encounter (INDEPENDENT_AMBULATORY_CARE_PROVIDER_SITE_OTHER): Payer: Self-pay | Admitting: Family Medicine

## 2022-09-02 ENCOUNTER — Telehealth (INDEPENDENT_AMBULATORY_CARE_PROVIDER_SITE_OTHER): Payer: Medicaid Other | Admitting: Family Medicine

## 2022-09-02 DIAGNOSIS — E669 Obesity, unspecified: Secondary | ICD-10-CM | POA: Diagnosis not present

## 2022-09-02 DIAGNOSIS — E559 Vitamin D deficiency, unspecified: Secondary | ICD-10-CM | POA: Diagnosis not present

## 2022-09-02 DIAGNOSIS — Z6841 Body Mass Index (BMI) 40.0 and over, adult: Secondary | ICD-10-CM | POA: Diagnosis not present

## 2022-09-02 DIAGNOSIS — R632 Polyphagia: Secondary | ICD-10-CM | POA: Diagnosis not present

## 2022-09-23 ENCOUNTER — Encounter (INDEPENDENT_AMBULATORY_CARE_PROVIDER_SITE_OTHER): Payer: Self-pay | Admitting: Family Medicine

## 2022-09-23 ENCOUNTER — Ambulatory Visit (INDEPENDENT_AMBULATORY_CARE_PROVIDER_SITE_OTHER): Payer: Medicaid Other | Admitting: Family Medicine

## 2022-09-23 VITALS — BP 122/82 | HR 67 | Temp 97.6°F | Ht 68.0 in | Wt 286.0 lb

## 2022-09-23 DIAGNOSIS — E669 Obesity, unspecified: Secondary | ICD-10-CM

## 2022-09-23 DIAGNOSIS — R632 Polyphagia: Secondary | ICD-10-CM

## 2022-09-23 DIAGNOSIS — Z6841 Body Mass Index (BMI) 40.0 and over, adult: Secondary | ICD-10-CM | POA: Diagnosis not present

## 2022-09-23 DIAGNOSIS — E559 Vitamin D deficiency, unspecified: Secondary | ICD-10-CM | POA: Diagnosis not present

## 2022-09-23 DIAGNOSIS — R7303 Prediabetes: Secondary | ICD-10-CM

## 2022-09-23 DIAGNOSIS — E88819 Insulin resistance, unspecified: Secondary | ICD-10-CM

## 2022-09-23 MED ORDER — VITAMIN D (ERGOCALCIFEROL) 1.25 MG (50000 UNIT) PO CAPS: 50000.0000 [IU] | ORAL_CAPSULE | ORAL | 0 refills | Status: DC

## 2022-09-23 MED ORDER — CONTRAVE 8-90 MG PO TB12: ORAL_TABLET | ORAL | 0 refills | Status: DC

## 2022-09-24 DIAGNOSIS — E88819 Insulin resistance, unspecified: Secondary | ICD-10-CM | POA: Insufficient documentation

## 2022-10-06 NOTE — Progress Notes (Unsigned)
TeleHealth Visit:  This visit was completed with telemedicine (audio/video) technology. Mia Parker has verbally consented to this TeleHealth visit. The patient is located at home, the provider is located at home. The participants in this visit include the listed provider and patient. The visit was conducted today via MyChart video.  OBESITY Mia Parker is here to discuss her progress with her obesity treatment plan along with follow-up of her obesity related diagnoses.   Today's visit was # 7 Starting weight: 288 lbs Starting date: 04/01/22 Weight at last in office visit: 286 lbs on 09/23/22 Total weight loss: 2 lbs at last in office visit on 09/23/22. Today's reported weight: No weight reported.  Nutrition Plan: Category 3 Plan plus 100 calories- 80%  Current exercise: Cardio and resistance ([employer] virtual) 5-6 days per week for 30 minutes. Walks 20 minutes 5 days per week   Interim History:  Mia Parker struggled with adherence to the meal plan over the holidays and Mia Parker was up 4 pounds at her last office visit.  Mia Parker traveled to [place] for one week end of December. Mia Parker estimates about 80% plan compliance.  Mia Parker has been getting in all of the food on the meal plan.  Mia Parker usually cooks dinner at home.  Mia Parker is making good snacking choices-[place] Breaks, [place] bars, yogurt. Drinks only water.  Mia Parker is currently on Contrave.  Mia Parker forgot to take it with her on her trip to [place] so Mia Parker has had to titrate the dose back up.  Mia Parker is currently taking 2 pills in the morning and 1 pill at night. Mia Parker says that it causes her to clench her jaws.  Mia Parker is on the fence about whether the benefit is worth the side effect. We talked about GLP-1 therapy briefly.  Mia Parker thinks Mia Parker will have a different insurance in February.  Assessment/Plan:  1. Prediabetes Last A1c was 5.8. Medication(s): None Lab Results  Component Value Date   HGBA1C 5.8 (H) 07/02/2022   Lab Results  Component Value Date    INSULIN 16.7 07/02/2022    Plan: Continue meal plan and exercise. Consider stopping Contrave and starting GLP-1 if Mia Parker has coverage with new insurance.   2. Obesity: Current BMI 43.5 Continue Contrave-May increase to 2 pills twice daily. Consider GLP-1 if Mia Parker has coverage with new insurance. [name] is currently in the action stage of change. As such, her goal is to continue with weight loss efforts.  Mia Parker has agreed to the Category 3 Plan plus 100 calories.  1.  Increase plan compliance to 90% or better. 2.  Keep snack calories less than 400/day.  Exercise goals:  as is  Behavioral modification strategies: increasing lean protein intake, decreasing simple carbohydrates, and planning for success.  Shavaun has agreed to follow-up with our clinic in 3 weeks.   No orders of the defined types were placed in this encounter.   Medications Discontinued During This Encounter  Medication Reason   amphetamine-dextroamphetamine (ADDERALL) 20 MG tablet    amphetamine-dextroamphetamine (ADDERALL) 20 MG tablet    amphetamine-dextroamphetamine (ADDERALL) 20 MG tablet      No orders of the defined types were placed in this encounter.     Objective:   VITALS: Per patient if applicable, see vitals. GENERAL: Alert and in no acute distress. CARDIOPULMONARY: No increased WOB. Speaking in clear sentences.  PSYCH: Pleasant and cooperative. Speech normal rate and rhythm. Affect is appropriate. Insight and judgement are appropriate. Attention is focused, linear, and appropriate.  NEURO: Oriented as arrived to appointment on  time with no prompting.   Lab Results  Component Value Date   CREATININE 0.80 07/02/2022   BUN 8 07/02/2022   NA 138 07/02/2022   K 4.3 07/02/2022   CL 101 07/02/2022   CO2 22 07/02/2022   Lab Results  Component Value Date   ALT 17 07/02/2022   AST 20 07/02/2022   ALKPHOS 108 07/02/2022   BILITOT 0.6 07/02/2022   Lab Results  Component Value Date   HGBA1C 5.8 (H)  07/02/2022   HGBA1C 5.7 (A) 02/22/2020   HGBA1C 6.0 (H) 10/02/2017   Lab Results  Component Value Date   INSULIN 16.7 07/02/2022   Lab Results  Component Value Date   TSH 2.240 07/02/2022   Lab Results  Component Value Date   CHOL 235 (H) 07/02/2022   HDL 89 07/02/2022   LDLCALC 129 (H) 07/02/2022   TRIG 97 07/02/2022   CHOLHDL 2.6 07/02/2022   Lab Results  Component Value Date   WBC 9.2 07/02/2022   HGB 15.3 07/02/2022   HCT 45.2 07/02/2022   MCV 85 07/02/2022   PLT 335 07/02/2022   No results found for: "IRON", "TIBC", "FERRITIN" Lab Results  Component Value Date   VD25OH 19.0 (L) 07/02/2022    Attestation Statements:   Reviewed by clinician on day of visit: allergies, medications, problem list, medical history, surgical history, family history, social history, and previous encounter notes.

## 2022-10-08 ENCOUNTER — Telehealth (INDEPENDENT_AMBULATORY_CARE_PROVIDER_SITE_OTHER): Payer: Medicaid Other | Admitting: Family Medicine

## 2022-10-08 ENCOUNTER — Encounter (INDEPENDENT_AMBULATORY_CARE_PROVIDER_SITE_OTHER): Payer: Self-pay | Admitting: Family Medicine

## 2022-10-08 DIAGNOSIS — E669 Obesity, unspecified: Secondary | ICD-10-CM | POA: Diagnosis not present

## 2022-10-08 DIAGNOSIS — R7303 Prediabetes: Secondary | ICD-10-CM | POA: Diagnosis not present

## 2022-10-08 DIAGNOSIS — Z6841 Body Mass Index (BMI) 40.0 and over, adult: Secondary | ICD-10-CM | POA: Diagnosis not present

## 2022-10-13 NOTE — Progress Notes (Unsigned)
Chief Complaint:   OBESITY Mia Parker is here to discuss her progress with her obesity treatment plan along with follow-up of her obesity related diagnoses. Mia Parker is on the Category 3 Plan+ 100 calories and states she is following her eating plan approximately 50% of the time. Mia Parker states she is walking, hiking for 20-30 minutes 5 times per week.  Today's visit was #: 6 Starting weight: 288 lbs Starting date: 04/01/22 Today's weight: 286 lbs Today's date: 09/23/22 Total lbs lost to date: 2 Total lbs lost since last in-office visit: +4  Interim History: Forgot to take Contrave while in [place] on vacation.  Has more food/sugar cravings due to getting off track.  Gained 2 pounds of muscle mass.  Subjective:   1. Vitamin D deficiency On vitamin D 50,000 IU weekly. 10/18 vitamin D level 19.0  2. Pre-diabetes 07/02/2022 A1c 5.8 Actively reducing intake of starches and sweets.  3. Insulin resistance 07/02/2022 fasting insulin level 16.7 Cravings are improving with dietary changes.   Assessment/Plan:   1. Vitamin D deficiency Recheck vitamin D level in 1 to 2 months. Refilled: - Vitamin D, Ergocalciferol, (DRISDOL) 1.25 MG (50000 UNIT) CAPS capsule; Take 1 capsule (50,000 Units total) by mouth every 7 (seven) days.  Dispense: 5 capsule; Refill: 0  2. Pre-diabetes Recheck A1c in the next 1 to 2 months. Increase walking time.  3. Insulin resistance Consider use of metformin if fasting insulin does not improve. - Naltrexone-buPROPion HCl ER (CONTRAVE) 8-90 MG TB12; 2 tabs po with breakfast and 2 tabs po with dinner daily  Dispense: 120 tablet; Refill: 0  4. Obesity,current BMI 43.6 1.  Restart Contrave-at ramping dose.  Refilled Contrave - Naltrexone-buPROPion HCl ER (CONTRAVE) 8-90 MG TB12; 2 tabs po with breakfast and 2 tabs po with dinner daily  Dispense: 120 tablet; Refill: 0 2.  Plans to try workouts on [employer].  Mia Parker is currently in the action stage of change.  As such, her goal is to continue with weight loss efforts. She has agreed to Category 3 Plan+ 100 calories.   Exercise goals: Increase home exercises 2-3 times per week.  Behavioral modification strategies: increasing lean protein intake, increasing vegetables, increasing water intake, decreasing eating out, no skipping meals, meal planning and cooking strategies, keeping healthy foods in the home, planning for success, and decreasing junk food.  Mia Parker has agreed to follow-up with our clinic in 3 weeks. She was informed of the importance of frequent follow-up visits to maximize her success with intensive lifestyle modifications for her multiple health conditions.    Objective:   Blood pressure 122/82, pulse 67, temperature 97.6 F (36.4 C), height 5\' 8"  (1.727 m), weight 286 lb (129.7 kg), SpO2 98 %. Body mass index is 43.49 kg/m.  General: Cooperative, alert, well developed, in no acute distress. HEENT: Conjunctivae and lids unremarkable. Cardiovascular: Regular rhythm.  Lungs: Normal work of breathing. Neurologic: No focal deficits.   Lab Results  Component Value Date   CREATININE 0.80 07/02/2022   BUN 8 07/02/2022   NA 138 07/02/2022   K 4.3 07/02/2022   CL 101 07/02/2022   CO2 22 07/02/2022   Lab Results  Component Value Date   ALT 17 07/02/2022   AST 20 07/02/2022   ALKPHOS 108 07/02/2022   BILITOT 0.6 07/02/2022   Lab Results  Component Value Date   HGBA1C 5.8 (H) 07/02/2022   HGBA1C 5.7 (A) 02/22/2020   HGBA1C 6.0 (H) 10/02/2017   Lab Results  Component Value  Date   INSULIN 16.7 07/02/2022   Lab Results  Component Value Date   TSH 2.240 07/02/2022   Lab Results  Component Value Date   CHOL 235 (H) 07/02/2022   HDL 89 07/02/2022   LDLCALC 129 (H) 07/02/2022   TRIG 97 07/02/2022   CHOLHDL 2.6 07/02/2022   Lab Results  Component Value Date   VD25OH 19.0 (L) 07/02/2022   Lab Results  Component Value Date   WBC 9.2 07/02/2022   HGB 15.3 07/02/2022    HCT 45.2 07/02/2022   MCV 85 07/02/2022   PLT 335 07/02/2022   No results found for: "IRON", "TIBC", "FERRITIN"   Attestation Statements:   Reviewed by clinician on day of visit: allergies, medications, problem list, medical history, surgical history, family history, social history, and previous encounter notes.  I, Georgianne Fick, FNP, am acting as transcriptionist for Dr. Loyal Gambler.  I have reviewed the above documentation for accuracy and completeness, and I agree with the above. Dell Ponto, DO

## 2022-10-24 DIAGNOSIS — R5383 Other fatigue: Secondary | ICD-10-CM | POA: Diagnosis not present

## 2022-10-24 DIAGNOSIS — R051 Acute cough: Secondary | ICD-10-CM | POA: Diagnosis not present

## 2022-10-24 DIAGNOSIS — Z6841 Body Mass Index (BMI) 40.0 and over, adult: Secondary | ICD-10-CM | POA: Diagnosis not present

## 2022-10-24 DIAGNOSIS — M791 Myalgia, unspecified site: Secondary | ICD-10-CM | POA: Diagnosis not present

## 2022-10-27 ENCOUNTER — Telehealth (INDEPENDENT_AMBULATORY_CARE_PROVIDER_SITE_OTHER): Payer: Medicaid Other | Admitting: Physician Assistant

## 2022-10-27 DIAGNOSIS — Z6841 Body Mass Index (BMI) 40.0 and over, adult: Secondary | ICD-10-CM

## 2022-10-27 DIAGNOSIS — R7303 Prediabetes: Secondary | ICD-10-CM | POA: Diagnosis not present

## 2022-10-27 DIAGNOSIS — E669 Obesity, unspecified: Secondary | ICD-10-CM | POA: Diagnosis not present

## 2022-10-27 DIAGNOSIS — E559 Vitamin D deficiency, unspecified: Secondary | ICD-10-CM | POA: Diagnosis not present

## 2022-10-30 ENCOUNTER — Ambulatory Visit (INDEPENDENT_AMBULATORY_CARE_PROVIDER_SITE_OTHER): Payer: Medicaid Other | Admitting: Family Medicine

## 2022-11-03 DIAGNOSIS — H6692 Otitis media, unspecified, left ear: Secondary | ICD-10-CM | POA: Diagnosis not present

## 2022-11-03 DIAGNOSIS — J209 Acute bronchitis, unspecified: Secondary | ICD-10-CM | POA: Diagnosis not present

## 2022-11-05 ENCOUNTER — Encounter (INDEPENDENT_AMBULATORY_CARE_PROVIDER_SITE_OTHER): Payer: Self-pay | Admitting: Physician Assistant

## 2022-11-05 NOTE — Progress Notes (Signed)
TeleHealth Visit:  This visit was completed with telemedicine (audio/video) technology. Mia Parker has verbally consented to this TeleHealth visit. The patient is located at home, the provider is located at home. The participants in this visit include the listed provider and patient. The visit was conducted today via MyChart video.  OBESITY Mia Parker is here to discuss her progress with her obesity treatment plan along with follow-up of her obesity related diagnoses.   Today's visit was # 8 Starting weight: 288 lbs Starting date: 04/01/22 Weight at last in office visit: 286 lbs on 09/23/22 Total weight loss: 2 lbs at last in office visit on 09/23/22. Today's reported weight:  No weight reported.  Nutrition Plan: the Category 3 plan and Other +100 calories .  Current exercise: none  Interim History:  Mia Parker has been very sick with initially a gastrointestinal virus and now is very sick with bronchitis.  She has struggled to say on plan and was off plan much over the past couple of weeks.  Reports trying to stay hydrated, drinking lots of orange juice and eating when has appetite.  Off Contrave due to the GI virus and plans to resume Contrave once she is feeling better.  She does feel the Contrave was helping to decrease appetite and hunger when she was taking.   Antiobesity Medication Mia Parker is on Contrave 8-24m and was taking 2 tabs twice daily, but has been off during her illness.  Adverse side effects: Nausea Hunger is well controlled when taking Contrave.  Assessment/Plan:  1 Pre-diabetes- A1c 5.8/Insulin 16.7- not at goal. May consider metformin in future.   Plan: Continue working on decreasing simple carbohydrates, increasing lean protein and exercise to promote weight loss, improve glycemic control/prevent progression to Type 2 diabetes.  Recheck labs in 1-2 months.   2. Vitamin D Deficiency Vitamin D is not at goal of 50.  Most recent vitamin D level was 19.0. She is on   prescription ergocalciferol 50,000 IU weekly. Lab Results  Component Value Date   VD25OH 19.0 (L) 07/02/2022    Plan: Continue prescription vitamin D 50,000 IU weekly. Recheck vit D level at least 2-3 times yearly to avoid over supplementation.   3. Obesity- Start BMI 43.79  Morbid Obesity: Current BMI  Antiobesity Medication Plan Continue Contrave and once ready to resume. May need to titrate up to 2 tabs twice daily if nauseated.   Mia Parker currently in the action stage of change. As such, her goal is to continue with weight loss efforts.  She has agreed to the Category 3 plan and Other +100 calories .  Exercise goals: All adults should avoid inactivity. Some physical activity is better than none, and adults who participate in any amount of physical activity gain some health benefits.  Behavioral modification strategies: increasing lean protein intake, decreasing simple carbohydrates , and keep healthy foods in the home.  Mia Parker agreed to follow-up with our clinic in 3 weeks.   No orders of the defined types were placed in this encounter.   There are no discontinued medications.   No orders of the defined types were placed in this encounter.     Objective:   VITALS: Per patient if applicable, see vitals. GENERAL: Alert and in no acute distress. CARDIOPULMONARY: No increased WOB. Speaking in clear sentences.  PSYCH: Pleasant and cooperative. Speech normal rate and rhythm. Affect is appropriate. Insight and judgement are appropriate. Attention is focused, linear, and appropriate.  NEURO: Oriented as arrived to appointment on time with  no prompting.   Attestation Statements:   Reviewed by clinician on day of visit: allergies, medications, problem list, medical history, surgical history, family history, social history, and previous encounter notes.  Time spent on visit including the items listed below was 30 minutes.  -preparing to see the patient (e.g., review of  tests, history, previous notes) -obtaining and/or reviewing separately obtained history -counseling and educating the patient/family/caregiver -documenting clinical information in the electronic or other health record -ordering medications, tests, or procedures -independently interpreting results and communicating results to the patient/ family/caregiver -referring and communicating with other health care professionals  -care coordination   This was prepared with the assistance of Presenter, broadcasting.  Occasional wrong-word or sound-a-like substitutions may have occurred due to the inherent limitations of voice recognition  software.    Mia Wherley,PA-C

## 2022-11-17 ENCOUNTER — Ambulatory Visit (INDEPENDENT_AMBULATORY_CARE_PROVIDER_SITE_OTHER): Payer: Medicaid Other | Admitting: Physician Assistant

## 2022-11-17 ENCOUNTER — Encounter (INDEPENDENT_AMBULATORY_CARE_PROVIDER_SITE_OTHER): Payer: Self-pay | Admitting: Physician Assistant

## 2022-11-17 VITALS — BP 125/83 | HR 87 | Temp 97.9°F | Ht 68.0 in | Wt 258.0 lb

## 2022-11-17 DIAGNOSIS — E669 Obesity, unspecified: Secondary | ICD-10-CM

## 2022-11-17 DIAGNOSIS — E559 Vitamin D deficiency, unspecified: Secondary | ICD-10-CM

## 2022-11-17 DIAGNOSIS — R7303 Prediabetes: Secondary | ICD-10-CM

## 2022-11-17 DIAGNOSIS — Z6841 Body Mass Index (BMI) 40.0 and over, adult: Secondary | ICD-10-CM

## 2022-11-17 DIAGNOSIS — Z6839 Body mass index (BMI) 39.0-39.9, adult: Secondary | ICD-10-CM

## 2022-11-17 MED ORDER — VITAMIN D (ERGOCALCIFEROL) 1.25 MG (50000 UNIT) PO CAPS: 50000.0000 [IU] | ORAL_CAPSULE | ORAL | 0 refills | Status: DC

## 2022-11-17 MED ORDER — METFORMIN HCL 500 MG PO TABS: 500.0000 mg | ORAL_TABLET | Freq: Every day | ORAL | 0 refills | Status: DC

## 2022-11-17 NOTE — Progress Notes (Signed)
Chief Complaint:   OBESITY Mia Parker is here to discuss her progress with her obesity treatment plan along with follow-up of her obesity related diagnoses. Mia Parker is on the Category 3 Plan and states she is following her eating plan approximately 10% of the time. Mia Parker states she is walking 30 minutes 4 times per week.  Today's visit was #: 9 Starting weight: 288 lbs Starting date: 04/01/2022 Today's weight: 285 lbs Today's date: 11/17/2022 Total lbs lost to date: 3 lbs Total lbs lost since last in-office visit: 1 lb  Interim History: Mia Parker has struggled with URI over most of February and is continuing to struggle with upper respiratory symptoms, cough, congestion. She has not been able to focus much on her nutrition plan.  Eating soups, popsicles with sore throats and salads with meat for protein, but unable to fully focus on plan.  She is starting to feel better overall and has begun walking again over the past week. She has not resumed Contrave at this point and we discussed other options including metformin for pre-diabetes. She has taken metformin in the past for gestational diabetes and tolerated this well.  Pharmacotherapy: Contrave was causing some jaw clenching and she was waking up with headaches. She has not been taking Contrave while so sick with the URI and was not sure she wanted to resume.   Subjective:   1. Pre-diabetes Prediabetes Last A1c was 5.8  Medication(s): None  Lab Results  Component Value Date   HGBA1C 5.8 (H) 07/02/2022   HGBA1C 5.7 (A) 02/22/2020   HGBA1C 6.0 (H) 10/02/2017   Lab Results  Component Value Date   INSULIN 16.7 07/02/2022    Plan: Working on nutrition plan to decrease simple carbohydrates, increase lean proteins and exercise to promote weight loss, improve glycemic control and prevent progression to Type 2 diabetes.   Start:  Metformin 500 mg once daily with meals  2. Vitamin D deficiency Vitamin D Deficiency Vitamin D is not  at goal of 50.  Most recent vitamin D level was 19.0. She is on  prescription ergocalciferol 50,000 IU weekly. No side effects with Ergocalciferol.  Lab Results  Component Value Date   VD25OH 19.0 (L) 07/02/2022    3. Obesity, Beginning BMI 43.79  4. Obesity,current BMI 43.6   Assessment/Plan:   1. Pre-diabetes Start metformin 500 mg once daily. Provided hand outs in metformin and insulin resistance. Continue working on nutrition plan to decrease simple carbohydrates, increase lean proteins and exercise to promote weight loss, improve glycemic control and prevent progression to Type 2 diabetes.   Start- metFORMIN (GLUCOPHAGE) 500 MG tablet; Take 1 tablet (500 mg total) by mouth daily.  Dispense: 30 tablet; Refill: 0  2. Vitamin D deficiency Continue Ergocalciferol once weekly.  REFILL- Vitamin D, Ergocalciferol, (DRISDOL) 1.25 MG (50000 UNIT) CAPS capsule; Take 1 capsule (50,000 Units total) by mouth every 7 (seven) days.  Dispense: 5 capsule; Refill: 0  3. Obesity, Beginning BMI 43.79  4. Obesity,current BMI 43.6 Mia Parker is currently in the action stage of change. As such, her goal is to continue with weight loss efforts. She has agreed to the Category 3 Plan.   Exercise goals: All adults should avoid inactivity. Some physical activity is better than none, and adults who participate in any amount of physical activity gain some health benefits.  Behavioral modification strategies: increasing lean protein intake, decreasing simple carbohydrates, meal planning and cooking strategies, and planning for success.  Mia Parker has agreed to follow-up with  our clinic in 2 weeks. She was informed of the importance of frequent follow-up visits to maximize her success with intensive lifestyle modifications for her multiple health conditions.     Objective:   Blood pressure 125/83, pulse 87, temperature 97.9 F (36.6 C), height '5\' 8"'$  (1.727 m), weight 258 lb (117 kg), SpO2 98 %. Body mass  index is 39.23 kg/m.  General: Cooperative, alert, well developed, in no acute distress. HEENT: Conjunctivae and lids unremarkable. Cardiovascular: Regular rhythm.  Lungs: Normal work of breathing. Neurologic: No focal deficits.   Lab Results  Component Value Date   CREATININE 0.80 07/02/2022   BUN 8 07/02/2022   NA 138 07/02/2022   K 4.3 07/02/2022   CL 101 07/02/2022   CO2 22 07/02/2022   Lab Results  Component Value Date   ALT 17 07/02/2022   AST 20 07/02/2022   ALKPHOS 108 07/02/2022   BILITOT 0.6 07/02/2022   Lab Results  Component Value Date   HGBA1C 5.8 (H) 07/02/2022   HGBA1C 5.7 (A) 02/22/2020   HGBA1C 6.0 (H) 10/02/2017   Lab Results  Component Value Date   INSULIN 16.7 07/02/2022   Lab Results  Component Value Date   TSH 2.240 07/02/2022   Lab Results  Component Value Date   CHOL 235 (H) 07/02/2022   HDL 89 07/02/2022   LDLCALC 129 (H) 07/02/2022   TRIG 97 07/02/2022   CHOLHDL 2.6 07/02/2022   Lab Results  Component Value Date   VD25OH 19.0 (L) 07/02/2022   Lab Results  Component Value Date   WBC 9.2 07/02/2022   HGB 15.3 07/02/2022   HCT 45.2 07/02/2022   MCV 85 07/02/2022   PLT 335 07/02/2022   No results found for: "IRON", "TIBC", "FERRITIN"  Obesity Behavioral Intervention:   Approximately 15 minutes were spent on the discussion below.  ASK: We discussed the diagnosis of obesity with Mia Parker today and Mia Parker agreed to give [place] permission to discuss obesity behavioral modification therapy today.  ASSESS: Armeda has the diagnosis of obesity and her BMI today is 39.24. Mia Parker is in the action stage of change.   ADVISE: Mia Parker was educated on the multiple health risks of obesity as well as the benefit of weight loss to improve her health. She was advised of the need for long term treatment and the importance of lifestyle modifications to improve her current health and to decrease her risk of future health problems.  AGREE: Multiple  dietary modification options and treatment options were discussed and Mia Parker agreed to follow the recommendations documented in the above note.  ARRANGE: Mia Parker was educated on the importance of frequent visits to treat obesity as outlined per CMS and USPSTF guidelines and agreed to schedule her next follow up appointment today.  Attestation Statements:   Reviewed by clinician on day of visit: allergies, medications, problem list, medical history, surgical history, family history, social history, and previous encounter notes.  Nakyiah Kuck,PA-C

## 2022-12-01 ENCOUNTER — Encounter (INDEPENDENT_AMBULATORY_CARE_PROVIDER_SITE_OTHER): Payer: Self-pay | Admitting: *Deleted

## 2022-12-01 ENCOUNTER — Ambulatory Visit (INDEPENDENT_AMBULATORY_CARE_PROVIDER_SITE_OTHER): Payer: Medicaid Other | Admitting: Physician Assistant

## 2022-12-01 ENCOUNTER — Encounter (INDEPENDENT_AMBULATORY_CARE_PROVIDER_SITE_OTHER): Payer: Self-pay | Admitting: Physician Assistant

## 2022-12-01 VITALS — BP 126/86 | HR 86 | Temp 97.8°F | Ht 68.0 in | Wt 281.0 lb

## 2022-12-01 DIAGNOSIS — R7303 Prediabetes: Secondary | ICD-10-CM | POA: Diagnosis not present

## 2022-12-01 DIAGNOSIS — E669 Obesity, unspecified: Secondary | ICD-10-CM

## 2022-12-01 DIAGNOSIS — Z6839 Body mass index (BMI) 39.0-39.9, adult: Secondary | ICD-10-CM | POA: Diagnosis not present

## 2022-12-01 DIAGNOSIS — E559 Vitamin D deficiency, unspecified: Secondary | ICD-10-CM

## 2022-12-01 MED ORDER — VITAMIN D (ERGOCALCIFEROL) 1.25 MG (50000 UNIT) PO CAPS: 50000.0000 [IU] | ORAL_CAPSULE | ORAL | 0 refills | Status: DC

## 2022-12-01 NOTE — Assessment & Plan Note (Signed)
Prediabetes Last A1c was 5.8  Medication(s): Metformin 500 mg once daily breakfast Lab Results  Component Value Date   HGBA1C 5.8 (H) 07/02/2022   HGBA1C 5.7 (A) 02/22/2020   HGBA1C 6.0 (H) 10/02/2017   Lab Results  Component Value Date   INSULIN 16.7 07/02/2022    Plan: Continue Metformin 500 mg once daily breakfast She has not yet started metformin, but plans to start this week.  Continue working on nutrition plan to decrease simple carbohydrates, increase lean proteins and exercise to promote weight loss, improve glycemic control and prevent progression to Type 2 diabetes.

## 2022-12-01 NOTE — Assessment & Plan Note (Signed)
Vitamin D Deficiency Vitamin D is not at goal of 50.  Most recent vitamin D level was 19.0. She is on  prescription ergocalciferol 50,000 IU weekly. No side effects with vitamin D.  Lab Results  Component Value Date   VD25OH 19.0 (L) 07/02/2022    Plan: Refill prescription vitamin D 50,000 IU weekly. Recheck vit D level 2-3 times yearly to avoid over supplementation.

## 2022-12-01 NOTE — Progress Notes (Signed)
Office: [phone]  /  Fax: [phone]  WEIGHT SUMMARY AND BIOMETRICS  Vitals Temp: 97.8 F (36.6 C) BP: 126/86 Pulse Rate: 86 SpO2: 98 %   Anthropometric Measurements Height: 5\' 8"  (1.727 m) Weight: 281 lb (127.5 kg) BMI (Calculated): 42.74 Weight at Last Visit: 285 lb Weight Lost Since Last Visit: 4 lb Weight Gained Since Last Visit: 0 lb Starting Weight: 288 lb Total Weight Loss (lbs): 7 lb (3.175 kg)   Body Composition  Body Fat %: 48.3 % Fat Mass (lbs): 136 lbs Muscle Mass (lbs): 145.6 lbs Total Body Water (lbs): 92.6 lbs Visceral Fat Rating : 15   Other Clinical Data Fasting: no Labs: no Today's Visit #: 10 Starting Date: 04/01/22     HPI  Chief Complaint: OBESITY  Mia Parker is here to discuss her progress with her obesity treatment plan. She is on the the Category 3 Plan and states she is following her eating plan approximately 70 % of the time. She states she is exercising/walking/VR headset 60 minutes 6-7 times per week.   Interval History:  Since last office visit she has done well with weight loss.  Down 4 lbs, total weight loss 7 lbs.  Has been exercising more- walking 2 miles at least 5 days weekly and doing VR exercise program.  Challenges are children wanting something different for dinner and struggles with meal planning/prepping due to having to fix multiple meals. Discussed considering meal prep programs like [employer] or [employer].  Has beach trip planned next week with family and discussed strategies for eating out.    Pharmacotherapy: Stopped Contrave due to side effects of waking with headaches/jaw clenching.  Has not started metformin yet for prediabetes, but plans to start this week.   PHYSICAL EXAM:  Blood pressure 126/86, pulse 86, temperature 97.8 F (36.6 C), height 5\' 8"  (1.727 m), weight 281 lb (127.5 kg), SpO2 98 %. Body mass index is 42.73 kg/m.  General: She is overweight, cooperative, alert, well developed, and  in no acute distress. PSYCH: Has normal mood, affect and thought process.   Lungs: Normal breathing effort, no conversational dyspnea.  DIAGNOSTIC DATA REVIEWED:  BMET    Component Value Date/Time   NA 138 07/02/2022 0847   K 4.3 07/02/2022 0847   CL 101 07/02/2022 0847   CO2 22 07/02/2022 0847   GLUCOSE 106 (H) 07/02/2022 0847   GLUCOSE 90 05/23/2016 1420   BUN 8 07/02/2022 0847   CREATININE 0.80 07/02/2022 0847   CREATININE 0.69 09/14/2015 0001   CALCIUM 9.6 07/02/2022 0847   GFRNONAA 84 02/22/2020 1607   GFRAA 97 02/22/2020 1607   Lab Results  Component Value Date   HGBA1C 5.8 (H) 07/02/2022   HGBA1C 6.0 (H) 10/02/2017   Lab Results  Component Value Date   INSULIN 16.7 07/02/2022   Lab Results  Component Value Date   TSH 2.240 07/02/2022   CBC    Component Value Date/Time   WBC 9.2 07/02/2022 0847   WBC 10.7 (H) 05/24/2016 0529   RBC 5.35 (H) 07/02/2022 0847   RBC 4.19 05/24/2016 0529   HGB 15.3 07/02/2022 0847   HGB 13.5 11/08/2015 0000   HCT 45.2 07/02/2022 0847   HCT 41 11/08/2015 0000   PLT 335 07/02/2022 0847   MCV 85 07/02/2022 0847   MCH 28.6 07/02/2022 0847   MCH 27.9 05/24/2016 0529   MCHC 33.8 07/02/2022 0847   MCHC 34.0 05/24/2016 0529   RDW 13.1 07/02/2022 0847   Iron  Studies No results found for: "IRON", "TIBC", "FERRITIN", "IRONPCTSAT" Lipid Panel     Component Value Date/Time   CHOL 235 (H) 07/02/2022 0847   TRIG 97 07/02/2022 0847   HDL 89 07/02/2022 0847   CHOLHDL 2.6 07/02/2022 0847   CHOLHDL 3.1 09/14/2015 0001   VLDL 20 09/14/2015 0001   LDLCALC 129 (H) 07/02/2022 0847   Hepatic Function Panel     Component Value Date/Time   PROT 7.5 07/02/2022 0847   ALBUMIN 4.7 07/02/2022 0847   AST 20 07/02/2022 0847   ALT 17 07/02/2022 0847   ALKPHOS 108 07/02/2022 0847   BILITOT 0.6 07/02/2022 0847      Component Value Date/Time   TSH 2.240 07/02/2022 0847   Nutritional Lab Results  Component Value Date   VD25OH 19.0 (L)  07/02/2022    ASSOCIATED CONDITIONS ADDRESSED TODAY  ASSESSMENT AND PLAN  Problem List Items Addressed This Visit     Vitamin D deficiency    Vitamin D Deficiency Vitamin D is not at goal of 27.  Most recent vitamin D level was 19.0. She is on  prescription ergocalciferol 50,000 IU weekly. No side effects with vitamin D.  Lab Results  Component Value Date   VD25OH 19.0 (L) 07/02/2022   Plan: Refill prescription vitamin D 50,000 IU weekly. Recheck vit D level 2-3 times yearly to avoid over supplementation.        Relevant Medications   Vitamin D, Ergocalciferol, (DRISDOL) 1.25 MG (50000 UNIT) CAPS capsule   Pre-diabetes - Primary    Prediabetes Last A1c was 5.8  Medication(s): Metformin 500 mg once daily breakfast Lab Results  Component Value Date   HGBA1C 5.8 (H) 07/02/2022   HGBA1C 5.7 (A) 02/22/2020   HGBA1C 6.0 (H) 10/02/2017   Lab Results  Component Value Date   INSULIN 16.7 07/02/2022   Plan: Continue Metformin 500 mg once daily breakfast She has not yet started metformin, but plans to start this week.  Continue working on nutrition plan to decrease simple carbohydrates, increase lean proteins and exercise to promote weight loss, improve glycemic control and prevent progression to Type 2 diabetes.         Obesity, Beginning BMI 43.79   Other Visit Diagnoses     BMI 39.0-39.9,adult Current BMI 39.24             TREATMENT PLAN FOR OBESITY:  Recommended Dietary Goals  [name] is currently in the action stage of change. As such, her goal is to continue weight management plan. She has agreed to the Category 3 Plan.  Behavioral Intervention  We discussed the following Behavioral Modification Strategies today: increasing lean protein intake, decreasing simple carbohydrates , increasing vegetables, increasing water intake, and work on meal planning and easy cooking plans.  Additional resources provided today: NA  Recommended Physical Activity  Goals  [name] has been advised to work up to 150 minutes of moderate intensity aerobic activity a week and strengthening exercises 2-3 times per week for cardiovascular health, weight loss maintenance and preservation of muscle mass.   She has agreed to Continue current level of physical activity    Pharmacotherapy We discussed various medication options to help Israella with her weight loss efforts and we both agreed to continue to work on nutrition plan to promote weight loss and begin metformin for prediabetes.    Return in about 3 weeks (around 12/22/2022).Marland Kitchen She was informed of the importance of frequent follow up visits to maximize her success with intensive lifestyle modifications  for her multiple health conditions.   ATTESTASTION STATEMENTS:  Reviewed by clinician on day of visit: allergies, medications, problem list, medical history, surgical history, family history, social history, and previous encounter notes.   I have personally spent 32 minutes total time today in preparation, patient care, nutritional counseling and documentation for this visit, including the following: review of clinical lab tests; review of medical tests/procedures/services.      Kayron Kalmar, PA-C

## 2022-12-18 ENCOUNTER — Ambulatory Visit (INDEPENDENT_AMBULATORY_CARE_PROVIDER_SITE_OTHER): Payer: Medicaid Other | Admitting: Physician Assistant

## 2022-12-18 ENCOUNTER — Encounter (INDEPENDENT_AMBULATORY_CARE_PROVIDER_SITE_OTHER): Payer: Self-pay | Admitting: Physician Assistant

## 2022-12-18 VITALS — BP 124/77 | HR 58 | Temp 97.6°F | Ht 68.0 in | Wt 285.0 lb

## 2022-12-18 DIAGNOSIS — E559 Vitamin D deficiency, unspecified: Secondary | ICD-10-CM

## 2022-12-18 DIAGNOSIS — F3289 Other specified depressive episodes: Secondary | ICD-10-CM

## 2022-12-18 DIAGNOSIS — Z6839 Body mass index (BMI) 39.0-39.9, adult: Secondary | ICD-10-CM

## 2022-12-18 DIAGNOSIS — R7303 Prediabetes: Secondary | ICD-10-CM | POA: Diagnosis not present

## 2022-12-18 DIAGNOSIS — Z6841 Body Mass Index (BMI) 40.0 and over, adult: Secondary | ICD-10-CM | POA: Diagnosis not present

## 2022-12-18 DIAGNOSIS — E669 Obesity, unspecified: Secondary | ICD-10-CM | POA: Diagnosis not present

## 2022-12-18 MED ORDER — METFORMIN HCL 500 MG PO TABS: 500.0000 mg | ORAL_TABLET | Freq: Every day | ORAL | 0 refills | Status: DC

## 2022-12-18 MED ORDER — BUPROPION HCL ER (SR) 150 MG PO TB12
150.0000 mg | ORAL_TABLET | Freq: Every day | ORAL | 0 refills | Status: DC
Start: 2022-12-18 — End: 2023-01-13

## 2022-12-18 MED ORDER — VITAMIN D (ERGOCALCIFEROL) 1.25 MG (50000 UNIT) PO CAPS: 50000.0000 [IU] | ORAL_CAPSULE | ORAL | 0 refills | Status: DC

## 2022-12-18 NOTE — Assessment & Plan Note (Signed)
Other depression/emotional eating Mia Parker has had issues with stress/emotional eating. Currently this is poorly controlled. Overall mood is stable. Medication(s): None   No history of seizure disorder. Hx of tubal ligation.   Took bupropion for post partum depression and felt was helpful for this and open to starting for emotional eating/cravings. Discussed possible risks, benefits and side effects and patient agreeable to start bupropion at this time.  Did not tolerate Contrave due to headaches/jaw clenching but never had side effects with bupropion previously.  Plan: Start Bupropion SR 150 mg daily in am Monitor response.  Continue to work on emotional eating strategies.

## 2022-12-18 NOTE — Assessment & Plan Note (Addendum)
Vitamin D Deficiency Vitamin D is not at goal of 50.  Most recent vitamin D level was 19.0. She is on  prescription ergocalciferol 50,000 IU weekly. No N/V or muscle weakness or other side effects with Ergocalciferol.  Lab Results  Component Value Date   VD25OH 19.0 (L) 07/02/2022    Plan: Continue and refill  prescription ergocalciferol 50,000 IU weekly Low vitamin D levels can be associated with adiposity and may result in leptin resistance and weight gain. Also associated with fatigue. Currently on vitamin D supplementation without any adverse effects.  Recheck vitamin D level over the next 1-2 months to avoid over supplementation.

## 2022-12-18 NOTE — Progress Notes (Signed)
Office: [phone]  /  Fax: [phone]  WEIGHT SUMMARY AND BIOMETRICS  Vitals Temp: 97.6 F (36.4 C) BP: 124/77 Pulse Rate: (!) 58 SpO2: 100 %   Anthropometric Measurements Height: 5\' 8"  (1.727 m) Weight: 285 lb (129.3 kg) BMI (Calculated): 43.34 Weight at Last Visit: 281 lb Weight Lost Since Last Visit: 0 lb Weight Gained Since Last Visit: 4 lb Starting Weight: 288 lb Total Weight Loss (lbs): 3 lb (1.361 kg)   Body Composition  Body Fat %: 49.1 % Fat Mass (lbs): 140.4 lbs Muscle Mass (lbs): 138 lbs Total Body Water (lbs): 94.8 lbs Visceral Fat Rating : 16   Other Clinical Data Fasting: no Labs: no Today's Visit #: 11 Starting Date: 04/01/22     HPI  Chief Complaint: OBESITY  Mia Parker is here to discuss her progress with her obesity treatment plan. She is on the the Category 3 Plan and states she is following her eating plan approximately 60 % of the time. She states she is exercising/walking/VR headset 10,000 steps 7 times per week.   Interval History:  Since last office visit she has been to the beach with family . Up 4 lbs.  Some eating out and off plan with beach trip and Easter candy and working to get back on track with plan.  Sleep- good and restorative but feels like she has low energy at times.    Pharmacotherapy: metformin for prediabetes.    Stopped Contrave due to side effects of waking with headaches/jaw clenching.      PHYSICAL EXAM:  Blood pressure 124/77, pulse (!) 58, temperature 97.6 F (36.4 C), height 5\' 8"  (1.727 m), weight 285 lb (129.3 kg), SpO2 100 %. Body mass index is 43.33 kg/m.  General: She is overweight, cooperative, alert, well developed, and in no acute distress. PSYCH: Has normal mood, affect and thought process.   Cardiovascular : regular rhythm Lungs: Normal breathing effort, no conversational dyspnea. Neuro: no focal deficits  DIAGNOSTIC DATA REVIEWED:  BMET    Component Value Date/Time   NA 138  07/02/2022 0847   K 4.3 07/02/2022 0847   CL 101 07/02/2022 0847   CO2 22 07/02/2022 0847   GLUCOSE 106 (H) 07/02/2022 0847   GLUCOSE 90 05/23/2016 1420   BUN 8 07/02/2022 0847   CREATININE 0.80 07/02/2022 0847   CREATININE 0.69 09/14/2015 0001   CALCIUM 9.6 07/02/2022 0847   GFRNONAA 84 02/22/2020 1607   GFRAA 97 02/22/2020 1607   Lab Results  Component Value Date   HGBA1C 5.8 (H) 07/02/2022   HGBA1C 6.0 (H) 10/02/2017   Lab Results  Component Value Date   INSULIN 16.7 07/02/2022   Lab Results  Component Value Date   TSH 2.240 07/02/2022   CBC    Component Value Date/Time   WBC 9.2 07/02/2022 0847   WBC 10.7 (H) 05/24/2016 0529   RBC 5.35 (H) 07/02/2022 0847   RBC 4.19 05/24/2016 0529   HGB 15.3 07/02/2022 0847   HGB 13.5 11/08/2015 0000   HCT 45.2 07/02/2022 0847   HCT 41 11/08/2015 0000   PLT 335 07/02/2022 0847   MCV 85 07/02/2022 0847   MCH 28.6 07/02/2022 0847   MCH 27.9 05/24/2016 0529   MCHC 33.8 07/02/2022 0847   MCHC 34.0 05/24/2016 0529   RDW 13.1 07/02/2022 0847   Iron Studies No results found for: "IRON", "TIBC", "FERRITIN", "IRONPCTSAT" Lipid Panel     Component Value Date/Time   CHOL 235 (H) 07/02/2022 0847   TRIG  97 07/02/2022 0847   HDL 89 07/02/2022 0847   CHOLHDL 2.6 07/02/2022 0847   CHOLHDL 3.1 09/14/2015 0001   VLDL 20 09/14/2015 0001   LDLCALC 129 (H) 07/02/2022 0847   Hepatic Function Panel     Component Value Date/Time   PROT 7.5 07/02/2022 0847   ALBUMIN 4.7 07/02/2022 0847   AST 20 07/02/2022 0847   ALT 17 07/02/2022 0847   ALKPHOS 108 07/02/2022 0847   BILITOT 0.6 07/02/2022 0847      Component Value Date/Time   TSH 2.240 07/02/2022 0847   Nutritional Lab Results  Component Value Date   VD25OH 19.0 (L) 07/02/2022    ASSOCIATED CONDITIONS ADDRESSED TODAY  ASSESSMENT AND PLAN  Problem List Items Addressed This Visit     Depression    Other depression/emotional eating Mia Parker has had issues with  stress/emotional eating. Currently this is poorly controlled. Overall mood is stable. Medication(s): None   No history of seizure disorder. Hx of tubal ligation.   Took bupropion for post partum depression and felt was helpful for this and open to starting for emotional eating/cravings. Discussed possible risks, benefits and side effects and patient agreeable to start bupropion at this time.  Did not tolerate Contrave due to headaches/jaw clenching but never had side effects with bupropion previously.  Plan: Start Bupropion SR 150 mg daily in am Monitor response.  Continue to work on emotional eating strategies.            Relevant Medications   buPROPion (WELLBUTRIN SR) 150 MG 12 hr tablet   Vitamin D deficiency    Vitamin D Deficiency Vitamin D is not at goal of 50.  Most recent vitamin D level was 19.0. She is on  prescription ergocalciferol 50,000 IU weekly. No N/V or muscle weakness or other side effects with Ergocalciferol.  Lab Results  Component Value Date   VD25OH 19.0 (L) 07/02/2022   Plan: Continue and refill  prescription ergocalciferol 50,000 IU weekly Low vitamin D levels can be associated with adiposity and may result in leptin resistance and weight gain. Also associated with fatigue. Currently on vitamin D supplementation without any adverse effects.  Recheck vitamin D level over the next 1-2 months to avoid over supplementation.         Relevant Medications   Vitamin D, Ergocalciferol, (DRISDOL) 1.25 MG (50000 UNIT) CAPS capsule   Pre-diabetes - Primary    Prediabetes Last A1c was 5.8/ Insulin 16.7- not at goals  Medication(s): Metformin 500 mg once daily breakfast No side effects with metformin.  She is working on Camera operator to decrease simple carbohydrates, increase lean proteins and exercise to promote weight loss, improve glycemic control and prevent progression to Type 2 diabetes.   Lab Results  Component Value Date   HGBA1C 5.8 (H) 07/02/2022    HGBA1C 5.7 (A) 02/22/2020   HGBA1C 6.0 (H) 10/02/2017   Lab Results  Component Value Date   INSULIN 16.7 07/02/2022   Plan: Continue and refill Metformin 500 mg once daily breakfast Continue working on nutrition plan to decrease simple carbohydrates, increase lean proteins and exercise to promote weight loss, improve glycemic control and prevent progression to Type 2 diabetes.         Relevant Medications   metFORMIN (GLUCOPHAGE) 500 MG tablet   Obesity, Beginning BMI 43.79   Relevant Medications   metFORMIN (GLUCOPHAGE) 500 MG tablet   Other Visit Diagnoses     BMI 40.0-44.9, adult Current BMI 43.5  Relevant Medications   metFORMIN (GLUCOPHAGE) 500 MG tablet      TREATMENT PLAN FOR OBESITY:  Recommended Dietary Goals  Kahlea is currently in the action stage of change. As such, her goal is to continue weight management plan. She has agreed to the Category 3 Plan.  Behavioral Intervention  We discussed the following Behavioral Modification Strategies today: increasing lean protein intake, decreasing simple carbohydrates , avoiding skipping meals, increasing water intake, emotional eating strategies and understanding the difference between hunger signals and cravings, planning for success, and ways to avoid night time snacking.  Additional resources provided today: NA  Recommended Physical Activity Goals  Makailey has been advised to work up to 150 minutes of moderate intensity aerobic activity a week and strengthening exercises 2-3 times per week for cardiovascular health, weight loss maintenance and preservation of muscle mass.   She has agreed to Continue current level of physical activity    Pharmacotherapy We discussed various medication options to help Jorjia with her weight loss efforts and we both agreed to continue to work on nutritional and behavioral strategies to promote weight loss and metformin for prediabetes and wellbutrin for emotional eating.  .    Return in about 3 weeks (around 01/08/2023).Marland Kitchen She was informed of the importance of frequent follow up visits to maximize her success with intensive lifestyle modifications for her multiple health conditions.   ATTESTASTION STATEMENTS:  Reviewed by clinician on day of visit: allergies, medications, problem list, medical history, surgical history, family history, social history, and previous encounter notes.   I have personally spent 33 minutes total time today in preparation, patient care, nutritional counseling and documentation for this visit, including the following: review of clinical lab tests; review of medical tests/procedures/services.      Mia Parker Dannemiller, PA-C

## 2022-12-18 NOTE — Assessment & Plan Note (Signed)
Prediabetes Last A1c was 5.8/ Insulin 16.7- not at goals  Medication(s): Metformin 500 mg once daily breakfast No side effects with metformin.  She is working on Camera operator to decrease simple carbohydrates, increase lean proteins and exercise to promote weight loss, improve glycemic control and prevent progression to Type 2 diabetes.   Lab Results  Component Value Date   HGBA1C 5.8 (H) 07/02/2022   HGBA1C 5.7 (A) 02/22/2020   HGBA1C 6.0 (H) 10/02/2017   Lab Results  Component Value Date   INSULIN 16.7 07/02/2022    Plan: Continue and refill Metformin 500 mg once daily breakfast Continue working on nutrition plan to decrease simple carbohydrates, increase lean proteins and exercise to promote weight loss, improve glycemic control and prevent progression to Type 2 diabetes.

## 2023-01-08 ENCOUNTER — Ambulatory Visit (INDEPENDENT_AMBULATORY_CARE_PROVIDER_SITE_OTHER): Payer: Medicaid Other | Admitting: Family Medicine

## 2023-01-11 ENCOUNTER — Other Ambulatory Visit (INDEPENDENT_AMBULATORY_CARE_PROVIDER_SITE_OTHER): Payer: Self-pay | Admitting: Physician Assistant

## 2023-01-11 DIAGNOSIS — F3289 Other specified depressive episodes: Secondary | ICD-10-CM

## 2023-01-27 ENCOUNTER — Ambulatory Visit (INDEPENDENT_AMBULATORY_CARE_PROVIDER_SITE_OTHER): Payer: Medicaid Other | Admitting: Physician Assistant

## 2023-01-27 ENCOUNTER — Encounter (INDEPENDENT_AMBULATORY_CARE_PROVIDER_SITE_OTHER): Payer: Self-pay | Admitting: Physician Assistant

## 2023-01-27 VITALS — BP 106/72 | HR 77 | Temp 98.4°F | Ht 68.0 in | Wt 281.0 lb

## 2023-01-27 DIAGNOSIS — F3289 Other specified depressive episodes: Secondary | ICD-10-CM | POA: Diagnosis not present

## 2023-01-27 DIAGNOSIS — Z6841 Body Mass Index (BMI) 40.0 and over, adult: Secondary | ICD-10-CM | POA: Insufficient documentation

## 2023-01-27 DIAGNOSIS — R7303 Prediabetes: Secondary | ICD-10-CM | POA: Diagnosis not present

## 2023-01-27 DIAGNOSIS — E559 Vitamin D deficiency, unspecified: Secondary | ICD-10-CM | POA: Diagnosis not present

## 2023-01-27 DIAGNOSIS — E669 Obesity, unspecified: Secondary | ICD-10-CM

## 2023-01-27 MED ORDER — METFORMIN HCL 500 MG PO TABS
500.0000 mg | ORAL_TABLET | Freq: Two times a day (BID) | ORAL | 0 refills | Status: DC
Start: 2023-01-27 — End: 2023-03-02

## 2023-01-27 MED ORDER — VITAMIN D (ERGOCALCIFEROL) 1.25 MG (50000 UNIT) PO CAPS
50000.0000 [IU] | ORAL_CAPSULE | ORAL | 0 refills | Status: DC
Start: 2023-01-27 — End: 2023-03-02

## 2023-01-27 NOTE — Progress Notes (Signed)
.smr  Office: [phone]  /  Fax: [phone]  WEIGHT SUMMARY AND BIOMETRICS  Vitals Temp: 98.4 F (36.9 C) BP: 106/72 Pulse Rate: 77 SpO2: 98 %   Anthropometric Measurements Height: 5\' 8"  (1.727 m) Weight: 281 lb (127.5 kg) BMI (Calculated): 42.74 Weight at Last Visit: 285lb Weight Lost Since Last Visit: 4lb Weight Gained Since Last Visit: 0 Starting Weight: 288lb Total Weight Loss (lbs): 7 lb (3.175 kg)   Body Composition  Body Fat %: 47.7 % Fat Mass (lbs): 134.2 lbs Muscle Mass (lbs): 139.6 lbs Total Body Water (lbs): 94.2 lbs Visceral Fat Rating : 15   Other Clinical Data Fasting: no Labs: no Today's Visit #: 12 Starting Date: 04/01/22     HPI  Chief Complaint: OBESITY  Haizlee is here to discuss her progress with her obesity treatment plan. She is on the the Category 3 Plan and states she is following her eating plan approximately 60 % of the time. She states she is exercising by walking 40 minutes 5 times per week.   Interval History:  Since last office visit she is down 4 lbs.  Hunger/appetite-Not excessive Cravings- Not excessive, not sure how much bupropion is helping, started ~2 weeks ago. Monitor Stress- manageable, not excessive Sleep- Restorative- 9 hrs nightly, co sleeping with kids Exercise-walking 5 days a wk for 40 mins Hydration-~64 oz of water daily.    Pharmacotherapy: Metformin for primary indication of prediabetes. No GI side effects.     Wellbutrin for emotional eating/cravings- No side effects with bupropion.   TREATMENT PLAN FOR OBESITY:  Recommended Dietary Goals  Loisann is currently in the action stage of change. As such, her goal is to continue weight management plan. She has agreed to the Category 3 Plan and keeping a food journal and adhering to recommended goals of 1600-1750 calories and 90+ grams of protein.  Behavioral Intervention  We discussed the following Behavioral Modification Strategies today: increasing  lean protein intake, decreasing simple carbohydrates , increasing vegetables, increasing fiber rich foods, increasing water intake, work on tracking and journaling calories using tracking application, emotional eating strategies and understanding the difference between hunger signals and cravings, continue to practice mindfulness when eating, and planning for success.  Additional resources provided today: NA  Recommended Physical Activity Goals  Amaka has been advised to work up to 150 minutes of moderate intensity aerobic activity a week and strengthening exercises 2-3 times per week for cardiovascular health, weight loss maintenance and preservation of muscle mass.   She has agreed to Continue current level of physical activity    Pharmacotherapy We discussed various medication options to help Stefenie with her weight loss efforts and we both agreed to continue metformin for primary indication of prediabetes and bupropion for emotional eating/cravings.  She was informed we would discuss her lab results at her next visit unless there is a critical issue that needs to be addressed sooner. She agreed to keep her next visit at the agreed upon time to discuss these results.     Return in about 3 weeks (around 02/17/2023).Marland Kitchen She was informed of the importance of frequent follow up visits to maximize her success with intensive lifestyle modifications for her multiple health conditions.  PHYSICAL EXAM:  Blood pressure 106/72, pulse 77, temperature 98.4 F (36.9 C), height 5\' 8"  (1.727 m), weight 281 lb (127.5 kg), SpO2 98 %. Body mass index is 42.73 kg/m.  General: She is overweight, cooperative, alert, well developed, and in no acute distress. PSYCH: Has normal  mood, affect and thought process.   Cardiovascular: HR 70's regular Lungs: Normal breathing effort, no conversational dyspnea. Neuro: no gross deficit  DIAGNOSTIC DATA REVIEWED:  BMET    Component Value Date/Time   NA 138 07/02/2022  0847   K 4.3 07/02/2022 0847   CL 101 07/02/2022 0847   CO2 22 07/02/2022 0847   GLUCOSE 106 (H) 07/02/2022 0847   GLUCOSE 90 05/23/2016 1420   BUN 8 07/02/2022 0847   CREATININE 0.80 07/02/2022 0847   CREATININE 0.69 09/14/2015 0001   CALCIUM 9.6 07/02/2022 0847   GFRNONAA 84 02/22/2020 1607   GFRAA 97 02/22/2020 1607   Lab Results  Component Value Date   HGBA1C 5.8 (H) 07/02/2022   HGBA1C 6.0 (H) 10/02/2017   Lab Results  Component Value Date   INSULIN 16.7 07/02/2022   Lab Results  Component Value Date   TSH 2.240 07/02/2022   CBC    Component Value Date/Time   WBC 9.2 07/02/2022 0847   WBC 10.7 (H) 05/24/2016 0529   RBC 5.35 (H) 07/02/2022 0847   RBC 4.19 05/24/2016 0529   HGB 15.3 07/02/2022 0847   HGB 13.5 11/08/2015 0000   HCT 45.2 07/02/2022 0847   HCT 41 11/08/2015 0000   PLT 335 07/02/2022 0847   MCV 85 07/02/2022 0847   MCH 28.6 07/02/2022 0847   MCH 27.9 05/24/2016 0529   MCHC 33.8 07/02/2022 0847   MCHC 34.0 05/24/2016 0529   RDW 13.1 07/02/2022 0847   Iron Studies No results found for: "IRON", "TIBC", "FERRITIN", "IRONPCTSAT" Lipid Panel     Component Value Date/Time   CHOL 235 (H) 07/02/2022 0847   TRIG 97 07/02/2022 0847   HDL 89 07/02/2022 0847   CHOLHDL 2.6 07/02/2022 0847   CHOLHDL 3.1 09/14/2015 0001   VLDL 20 09/14/2015 0001   LDLCALC 129 (H) 07/02/2022 0847   Hepatic Function Panel     Component Value Date/Time   PROT 7.5 07/02/2022 0847   ALBUMIN 4.7 07/02/2022 0847   AST 20 07/02/2022 0847   ALT 17 07/02/2022 0847   ALKPHOS 108 07/02/2022 0847   BILITOT 0.6 07/02/2022 0847      Component Value Date/Time   TSH 2.240 07/02/2022 0847   Nutritional Lab Results  Component Value Date   VD25OH 19.0 (L) 07/02/2022    ASSOCIATED CONDITIONS ADDRESSED TODAY  ASSESSMENT AND PLAN  Problem List Items Addressed This Visit     Depression   Vitamin D deficiency   Relevant Medications   Vitamin D, Ergocalciferol, (DRISDOL)  1.25 MG (50000 UNIT) CAPS capsule   Other Relevant Orders   VITAMIN D 25 Hydroxy (Vit-D Deficiency, Fractures)   Pre-diabetes - Primary   Relevant Medications   metFORMIN (GLUCOPHAGE) 500 MG tablet   Other Relevant Orders   CMP14+EGFR   Hemoglobin A1c   Insulin, random   Obesity, Beginning BMI 43.79   Relevant Medications   metFORMIN (GLUCOPHAGE) 500 MG tablet   BMI 40.0-44.9, adult (HCC) Current BMI 42.8   Relevant Medications   metFORMIN (GLUCOPHAGE) 500 MG tablet   Prediabetes Last A1c was 5.8/ Insulin 16.7- not at goals  Medication(s): Metformin 500 mg once daily breakfast No GI side effects with metformin.   She is working on Engineer, technical sales to decrease simple carbohydrates, increase lean proteins and exercise to promote weight loss, improve glycemic control and prevent progression to Type 2 diabetes.   Lab Results  Component Value Date   HGBA1C 5.8 (H) 07/02/2022   HGBA1C 5.7 (  A) 02/22/2020   HGBA1C 6.0 (H) 10/02/2017   Lab Results  Component Value Date   INSULIN 16.7 07/02/2022    Plan: Continue and refill and Continue and increase dose Metformin 500 mg twice daily with meals Continue working on nutrition plan to decrease simple carbohydrates, increase lean proteins and exercise to promote weight loss, improve glycemic control and prevent progression to Type 2 diabetes.  Recheck A1c, CMET and insulin levels today   Vitamin D Deficiency Vitamin D is not at goal of 50.  Most recent vitamin D level was 19.0. She is on  prescription ergocalciferol 50,000 IU weekly. No side effects with Ergocalciferol. No N/V or muscle weakness.  Lab Results  Component Value Date   VD25OH 19.0 (L) 07/02/2022    Plan: Continue and refill  prescription ergocalciferol 50,000 IU weekly Low vitamin D levels can be associated with adiposity and may result in leptin resistance and weight gain. Also associated with fatigue. Currently on vitamin D supplementation without any adverse effects.   Recheck vitamin D level today.   Other depression/emotional eating Daneli has had issues with stress/emotional eating. Currently this is moderately controlled. Overall mood is stable. Medication(s): Bupropion SR 150 mg daily in am  Plan: Continue Bupropion SR 150 mg daily in am Does not need refill this visit.  Working on emotional eating strategies.    ATTESTASTION STATEMENTS:  Reviewed by clinician on day of visit: allergies, medications, problem list, medical history, surgical history, family history, social history, and previous encounter notes.   I have personally spent 43 minutes total time today in preparation, patient care, nutritional counseling and documentation for this visit, including the following: review of clinical lab tests; review of medical tests/procedures/services.      Octavion Mollenkopf, PA-C

## 2023-01-28 DIAGNOSIS — Z20822 Contact with and (suspected) exposure to covid-19: Secondary | ICD-10-CM | POA: Diagnosis not present

## 2023-01-28 DIAGNOSIS — J020 Streptococcal pharyngitis: Secondary | ICD-10-CM | POA: Diagnosis not present

## 2023-01-28 LAB — CMP14+EGFR
ALT: 13 IU/L (ref 0–32)
AST: 19 IU/L (ref 0–40)
Albumin/Globulin Ratio: 1.5 (ref 1.2–2.2)
Albumin: 4.3 g/dL (ref 3.9–4.9)
Alkaline Phosphatase: 128 IU/L — ABNORMAL HIGH (ref 44–121)
BUN/Creatinine Ratio: 13 (ref 9–23)
BUN: 11 mg/dL (ref 6–24)
Bilirubin Total: 0.7 mg/dL (ref 0.0–1.2)
CO2: 19 mmol/L — ABNORMAL LOW (ref 20–29)
Calcium: 9.6 mg/dL (ref 8.7–10.2)
Chloride: 102 mmol/L (ref 96–106)
Creatinine, Ser: 0.86 mg/dL (ref 0.57–1.00)
Globulin, Total: 2.9 g/dL (ref 1.5–4.5)
Glucose: 90 mg/dL (ref 70–99)
Potassium: 4.2 mmol/L (ref 3.5–5.2)
Sodium: 137 mmol/L (ref 134–144)
Total Protein: 7.2 g/dL (ref 6.0–8.5)
eGFR: 82 mL/min/{1.73_m2} (ref >59)

## 2023-01-28 LAB — HEMOGLOBIN A1C
Est. average glucose Bld gHb Est-mCnc: 117 mg/dL
Hgb A1c MFr Bld: 5.7 % — ABNORMAL HIGH (ref 4.8–5.6)

## 2023-01-28 LAB — VITAMIN D 25 HYDROXY (VIT D DEFICIENCY, FRACTURES): Vit D, 25-Hydroxy: 44.1 ng/mL (ref 30.0–100.0)

## 2023-01-28 LAB — INSULIN, RANDOM: INSULIN: 11.5 u[IU]/mL (ref 2.6–24.9)

## 2023-02-17 ENCOUNTER — Ambulatory Visit (INDEPENDENT_AMBULATORY_CARE_PROVIDER_SITE_OTHER): Payer: Medicaid Other | Admitting: Physician Assistant

## 2023-02-17 ENCOUNTER — Encounter (INDEPENDENT_AMBULATORY_CARE_PROVIDER_SITE_OTHER): Payer: Self-pay | Admitting: Physician Assistant

## 2023-02-17 VITALS — BP 121/76 | HR 72 | Temp 98.0°F | Ht 68.0 in | Wt 281.0 lb

## 2023-02-17 DIAGNOSIS — R7303 Prediabetes: Secondary | ICD-10-CM

## 2023-02-17 DIAGNOSIS — E559 Vitamin D deficiency, unspecified: Secondary | ICD-10-CM | POA: Diagnosis not present

## 2023-02-17 DIAGNOSIS — E669 Obesity, unspecified: Secondary | ICD-10-CM | POA: Diagnosis not present

## 2023-02-17 DIAGNOSIS — Z6841 Body Mass Index (BMI) 40.0 and over, adult: Secondary | ICD-10-CM

## 2023-02-17 DIAGNOSIS — F3289 Other specified depressive episodes: Secondary | ICD-10-CM

## 2023-02-17 NOTE — Progress Notes (Signed)
.smr  Office: [phone]  /  Fax: [phone]  WEIGHT SUMMARY AND BIOMETRICS  Vitals Temp: 98 F (36.7 C) BP: 121/76 Pulse Rate: 72 SpO2: 97 %   Anthropometric Measurements Height: 5\' 8"  (1.727 m) Weight: 281 lb (127.5 kg) BMI (Calculated): 42.74 Weight at Last Visit: 285 lb Weight Lost Since Last Visit: 0 Weight Gained Since Last Visit: 0 Starting Weight: 288 lb   Body Composition  Body Fat %: 45.2 % Fat Mass (lbs): 127.2 lbs Muscle Mass (lbs): 146.2 lbs Total Body Water (lbs): 96.8 lbs Visceral Fat Rating : 14   Other Clinical Data Fasting: No Labs: no Today's Visit #: 13 Starting Date: 04/01/22     HPI  Chief Complaint: OBESITY  Mia Parker is here to discuss her progress with her obesity treatment plan. She is on the the Category 3 Plan and states she is following her eating plan approximately 60 % of the time. She states she is exercising 30 minutes 5 times per week.   Interval History:  Since last office visit she maintained weight loss. She is struggling with meal planning prepping more recently as the end of year school schedule and swim team schedule has made dinners in particular difficult.  She has been doing some grab and go meals.  She hopes that after this week her the schedule should settle down some and she will have more time to focus on meal planning and prepping. Hunger/appetite-not excessive Cravings-not excessive Stress-increased stress due to end of school year events and increase in scheduled activities but this should improve in the next week or so Sleep-she does cosleep with the kids but they are planning on moving the kids into their own bed she does feel her sleep is restorative however Exercise-she is walking more consistently and is scheduling appointments with friends to walk regularly Hydration-she does well with hydration and has a bottle with timed drinking schedule   Mia Parker, a patient with obesity and prediabetes, has  been maintaining her weight over the past several weeks. She is currently on metformin for prediabetes and Wellbutrin for emotional eating and cravings. Despite a busy schedule due to school and swim team commitments, the patient reports that her hunger and appetite have been manageable. She admits to having difficulty making healthy choices during dinner due to time constraints but has been successful in avoiding cravings, even with leftover key lime pie in her fridge.  The patient has been incorporating more physical activity into her routine, including walking and swimming. She has also been making healthier food choices, such as ground beef, yogurt, lettuce, tomatoes, and a variety of fruits. However, she has been experiencing constant thirst despite drinking two 32-ounce jugs of water daily, which she attributes to increased sweating due to physical activity.  The patient reports no issues with her current medications, including metformin and vitamin D. She has not started any new medications.  Despite the scale not showing significant changes, the patient's body composition has improved, with an increase in muscle mass and a decrease in adipose tissue. This has resulted in a decrease in her adipose percent to 45.2. The patient is committed to continuing her current regimen and plans to focus more on meal planning and prepping once her schedule becomes less hectic. Assessment & Plan Obesity: Maintained weight with improved body composition (increased muscle mass, decreased adipose tissue). Busy schedule impacting meal planning and choices. No specific cravings. -Continue current regimen of Metformin and Wellbutrin. -Encouraged to continue physical activity and focus  on protein intake. -Plan for meal prepping and planning once schedule allows.  Prediabetes: Improved lab results with Metformin treatment. -Continue Metformin. -Recheck labs at next visit.  Vitamin D deficiency: Improved levels with  supplementation. -Continue current Vitamin D supplementation. -Recheck levels at next visit.  Liver function: Slightly elevated Alkaline Phosphatase. -Monitor liver function. -Plan to recheck in a few months.  Pharmacotherapy: Metformin for prediabetes. No GI side effects with metformin.    Wellbutrin for cravings/emotional eating behaviors.   TREATMENT PLAN FOR OBESITY:  Recommended Dietary Goals  Mia Parker is currently in the action stage of change. As such, her goal is to continue weight management plan. She has agreed to the Category 3 Plan.  Behavioral Intervention  We discussed the following Behavioral Modification Strategies today: increasing lean protein intake, decreasing simple carbohydrates , increasing vegetables, increasing lower glycemic fruits, increasing water intake, work on meal planning and preparation, decreasing eating out or consumption of processed foods, and making healthy choices when eating convenient foods, continue to practice mindfulness when eating, and planning for success.  Additional resources provided today: NA  Recommended Physical Activity Goals  Mia Parker has been advised to work up to 150 minutes of moderate intensity aerobic activity a week and strengthening exercises 2-3 times per week for cardiovascular health, weight loss maintenance and preservation of muscle mass.   She has agreed to Continue current level of physical activity    Pharmacotherapy We discussed various medication options to help Mia Parker with her weight loss efforts and we both agreed to continue metformin for prediabetes and wellbutrin for cravings/emotional eating behavior.      Return in about 2 weeks (around 03/03/2023).Marland Kitchen She was informed of the importance of frequent follow up visits to maximize her success with intensive lifestyle modifications for her multiple health conditions.  PHYSICAL EXAM:  Blood pressure 121/76, pulse 72, temperature 98 F (36.7 C), height 5\' 8"   (1.727 m), weight 281 lb (127.5 kg), SpO2 97 %. Body mass index is 42.73 kg/m.  General: She is overweight, cooperative, alert, well developed, and in no acute distress. PSYCH: Has normal mood, affect and thought process.   Lungs: Normal breathing effort, no conversational dyspnea.  DIAGNOSTIC DATA REVIEWED:  BMET    Component Value Date/Time   NA 137 01/27/2023 0821   K 4.2 01/27/2023 0821   CL 102 01/27/2023 0821   CO2 19 (L) 01/27/2023 0821   GLUCOSE 90 01/27/2023 0821   GLUCOSE 90 05/23/2016 1420   BUN 11 01/27/2023 0821   CREATININE 0.86 01/27/2023 0821   CREATININE 0.69 09/14/2015 0001   CALCIUM 9.6 01/27/2023 0821   GFRNONAA 84 02/22/2020 1607   GFRAA 97 02/22/2020 1607   Lab Results  Component Value Date   HGBA1C 5.7 (H) 01/27/2023   HGBA1C 6.0 (H) 10/02/2017   Lab Results  Component Value Date   INSULIN 11.5 01/27/2023   INSULIN 16.7 07/02/2022   Lab Results  Component Value Date   TSH 2.240 07/02/2022   CBC    Component Value Date/Time   WBC 9.2 07/02/2022 0847   WBC 10.7 (H) 05/24/2016 0529   RBC 5.35 (H) 07/02/2022 0847   RBC 4.19 05/24/2016 0529   HGB 15.3 07/02/2022 0847   HGB 13.5 11/08/2015 0000   HCT 45.2 07/02/2022 0847   HCT 41 11/08/2015 0000   PLT 335 07/02/2022 0847   MCV 85 07/02/2022 0847   MCH 28.6 07/02/2022 0847   MCH 27.9 05/24/2016 0529   MCHC 33.8 07/02/2022 0847  MCHC 34.0 05/24/2016 0529   RDW 13.1 07/02/2022 0847   Iron Studies No results found for: "IRON", "TIBC", "FERRITIN", "IRONPCTSAT" Lipid Panel     Component Value Date/Time   CHOL 235 (H) 07/02/2022 0847   TRIG 97 07/02/2022 0847   HDL 89 07/02/2022 0847   CHOLHDL 2.6 07/02/2022 0847   CHOLHDL 3.1 09/14/2015 0001   VLDL 20 09/14/2015 0001   LDLCALC 129 (H) 07/02/2022 0847   Hepatic Function Panel     Component Value Date/Time   PROT 7.2 01/27/2023 0821   ALBUMIN 4.3 01/27/2023 0821   AST 19 01/27/2023 0821   ALT 13 01/27/2023 0821   ALKPHOS 128 (H)  01/27/2023 0821   BILITOT 0.7 01/27/2023 0821      Component Value Date/Time   TSH 2.240 07/02/2022 0847   Nutritional Lab Results  Component Value Date   VD25OH 44.1 01/27/2023   VD25OH 19.0 (L) 07/02/2022    ASSOCIATED CONDITIONS ADDRESSED TODAY  ASSESSMENT AND PLAN  Problem List Items Addressed This Visit     Depression   Vitamin D deficiency   Pre-diabetes - Primary   Obesity, Beginning BMI 43.79   Prediabetes Labs reviewed with the patient today Last A1c was 5.7/insulin 11.5-improving. Medication(s): Metformin 500 mg twice daily with meals no GI side effects with metformin Polyphagia:No She is working on nutrition plan to decrease simple carbohydrates, increase lean proteins and exercise to promote weight loss, improve glycemic control and prevent progression to Type 2 diabetes.    Lab Results  Component Value Date   HGBA1C 5.7 (H) 01/27/2023   HGBA1C 5.8 (H) 07/02/2022   HGBA1C 5.7 (A) 02/22/2020   HGBA1C 6.0 (H) 10/02/2017   Lab Results  Component Value Date   INSULIN 11.5 01/27/2023   INSULIN 16.7 07/02/2022    Plan: Continue Metformin 500 mg twice daily with meals no refills needed this visit Continue working on nutrition plan to decrease simple carbohydrates, increase lean proteins and exercise to promote weight loss, improve glycemic control and prevent progression to Type 2 diabetes.   Vitamin D Deficiency Labs reviewed this visit Vitamin D is not at goal of 50.  Most recent vitamin D level was 44.1. She is on  prescription ergocalciferol 50,000 IU weekly. Lab Results  Component Value Date   VD25OH 44.1 01/27/2023   VD25OH 19.0 (L) 07/02/2022    Plan: Continue  prescription ergocalciferol 50,000 IU weekly no refill needed this visit Low vitamin D levels can be associated with adiposity and may result in leptin resistance and weight gain. Also associated with fatigue. Currently on vitamin D supplementation without any adverse effects.  Will  plan to recheck after several months  Other depression/emotional eating Mia Parker has had issues with stress/emotional eating. Currently this is moderately controlled. Overall mood is stable. Medication(s): Bupropion SR 150 mg daily in am  Plan: Continue Bupropion SR 150 mg daily in am.  No refills needed this visit She will continue working on emotional eating strategies.  Elevated alkaline phosphatase:  She has a mildly elevated alkaline phosphatase on repeat labs, previous liver functions have been normal.  No previous imaging.  Could be due to fatty liver and will plan to reevaluate in several months and if elevated, may need to consider imaging. Plan: Recheck liver function in several months.   Plan:  Lab results reviewed with patient.  Disease counseling done.  Intensive lifestyle modifications are the first line treatment for this issue.  We discussed several lifestyle modifications today and she  will continue to work on diet, exercise and weight loss efforts.   Counseling: NAFLD is an umbrella term that encompasses a disease spectrum that includes steatosis (fat) without inflammation, steatohepatitis (NASH; fat + inflammation in a characteristic pattern), and cirrhosis. Bland steatosis is felt to be a benign condition, with extremely low to no risk of progression to cirrhosis, whereas NASH can progress to cirrhosis. The mainstay of treatment of NAFLD includes lifestyle modification to achieve weight loss, at least 7% of current body weight. Low carbohydrate diets can be beneficial in improving NAFLD liver histology. Additionally, exercise, even the absence of weight loss can have beneficial effects on the patient's metabolic profile and liver health. We recommend that their metabolic comorbidities be aggressively managed, as patients with NAFLD are at increased risk of coronary artery disease.    ATTESTASTION STATEMENTS:  Reviewed by clinician on day of visit: allergies, medications,  problem list, medical history, surgical history, family history, social history, and previous encounter notes.   I have personally spent 35 minutes total time today in preparation, patient care, nutritional counseling and documentation for this visit, including the following: review of clinical lab tests; review of medical tests/procedures/services.      Aberdeen Hafen, PA-C

## 2023-03-02 ENCOUNTER — Encounter (INDEPENDENT_AMBULATORY_CARE_PROVIDER_SITE_OTHER): Payer: Self-pay | Admitting: Physician Assistant

## 2023-03-02 ENCOUNTER — Ambulatory Visit (INDEPENDENT_AMBULATORY_CARE_PROVIDER_SITE_OTHER): Payer: Medicaid Other | Admitting: Physician Assistant

## 2023-03-02 VITALS — BP 113/75 | HR 79 | Temp 97.6°F | Ht 68.0 in | Wt 281.0 lb

## 2023-03-02 DIAGNOSIS — Z6841 Body Mass Index (BMI) 40.0 and over, adult: Secondary | ICD-10-CM

## 2023-03-02 DIAGNOSIS — E559 Vitamin D deficiency, unspecified: Secondary | ICD-10-CM

## 2023-03-02 DIAGNOSIS — E669 Obesity, unspecified: Secondary | ICD-10-CM | POA: Diagnosis not present

## 2023-03-02 DIAGNOSIS — F3289 Other specified depressive episodes: Secondary | ICD-10-CM

## 2023-03-02 DIAGNOSIS — R7303 Prediabetes: Secondary | ICD-10-CM

## 2023-03-02 MED ORDER — BUPROPION HCL ER (SR) 150 MG PO TB12: 150.0000 mg | ORAL_TABLET | Freq: Every day | ORAL | 0 refills | Status: DC

## 2023-03-02 MED ORDER — METFORMIN HCL 500 MG PO TABS
1000.0000 mg | ORAL_TABLET | Freq: Two times a day (BID) | ORAL | 0 refills | Status: DC
Start: 2023-03-02 — End: 2023-03-25

## 2023-03-02 MED ORDER — VITAMIN D (ERGOCALCIFEROL) 1.25 MG (50000 UNIT) PO CAPS
50000.0000 [IU] | ORAL_CAPSULE | ORAL | 0 refills | Status: DC
Start: 2023-03-02 — End: 2023-03-25

## 2023-03-02 NOTE — Progress Notes (Addendum)
.smr  Office: [phone]  /  Fax: [phone]  WEIGHT SUMMARY AND BIOMETRICS  Vitals Temp: 97.6 F (36.4 C) BP: 113/75 Pulse Rate: 79 SpO2: 97 %   Anthropometric Measurements Height: 5\' 8"  (1.727 m) Weight: 281 lb (127.5 kg) BMI (Calculated): 42.74 Weight at Last Visit: 281 lb Weight Lost Since Last Visit: 0 lb Weight Gained Since Last Visit: 0 lb Starting Weight: 288 lb   Body Composition  Body Fat %: 46.5 % Fat Mass (lbs): 131 lbs Muscle Mass (lbs): 143.2 lbs Total Body Water (lbs): 97.2 lbs Visceral Fat Rating : 15   Other Clinical Data Fasting: no Labs: no Today's Visit #: 14 Starting Date: 04/01/22     HPI  Chief Complaint: OBESITY  Mia Parker is here to discuss her progress with her obesity treatment plan. She is on the the Category 3 Plan and states she is following her eating plan approximately 60 % of the time. She states she is exercising walking 40 minutes 5 times per week.  Discussed the use of AI scribe software for clinical note transcription with the patient, who gave verbal consent to proceed.  Interval History:  Since last office visit she has maintained Hunger/appetite-some increased hunger signals at times Cravings-denies excessive hunger, feels Wellbutrin is helpful Stress-manageable Sleep-consistently getting at least 7 hours or more Exercise-previously was walking at least 11,000 steps daily, but now that the children are out of school she is having difficulty scheduling exercise and has some strategies to work on getting back to regular exercise such as walking while the children are at swim practice Hydration-adequate Protein intake-adequate  History of Present Illness          Mia Parker, a patient with obesity, prediabetes, and vitamin D deficiency, presents for a follow-up visit. She has been on a category three obesity treatment plan but has been struggling with meal planning and prepping due to end of school year events  and children's activities. She has been trying to increase her physical activity by walking daily, achieving up to 11,000 steps a day. However, she has found it challenging to maintain this level of activity due to her children's activities, particularly since the end of the school year. She is considering adjusting her schedule to accommodate her exercise routine.  The patient also discusses her struggles with emotional eating and cravings. She has been trying to make healthier choices when eating out, such as choosing salads and avoiding mayonnaise. She feels she is meeting her protein goals and is well-hydrated. She is on Wellbutrin, which she feels is helping with her cravings. She also reports that she is getting enough sleep and her stress levels have decreased since the end of the school year.  The patient is also on metformin for prediabetes and vitamin D supplementation for her vitamin D deficiency. She does not believe the metformin is causing any gastrointestinal upset. She is also on Wellbutrin, which she feels is helping with her cravings.  Obesity: Difficulty with meal planning and prepping due to end of school year events and children's activities. Regular walking exercise disrupted by children's summer schedule. Plans to adjust exercise schedule to accommodate children's activities. Struggling with evening cravings, particularly for chocolate. -Continue with category three plan. -Consider using My Fitness Pal for meal tracking with calorie goals of 1500-1600 and 100+ grams of protein daily. -Try Muscle Milk to help with chocolate cravings. -Continue Wellbutrin. -Increase Metformin to two tablets twice a day.  Prediabetes: On Metformin for prediabetes. -Increase Metformin to  two tablets twice a day.  Vitamin D deficiency: On vitamin D supplementation. Recent level was 44. -Reduce vitamin D supplementation to once every 14 days.  Follow-up appointment scheduled for 9:45am on Mia Parker 16,  2024. Pharmacotherapy: Metformin 500 mg twice daily for primary indication of prediabetes and Wellbutrin for emotional eating cravings  TREATMENT PLAN FOR OBESITY:  Recommended Dietary Goals  Mia Parker is currently in the action stage of change. As such, her goal is to continue weight management plan. She has agreed to the Category 3 Plan and keeping a food journal and adhering to recommended goals of 1500-1600 calories and 100+ grams protein.  Behavioral Intervention  We discussed the following Behavioral Modification Strategies today: increasing lean protein intake, decreasing simple carbohydrates , increasing vegetables, increasing lower glycemic fruits, increasing water intake, work on tracking and journaling calories using tracking application, emotional eating strategies and understanding the difference between hunger signals and cravings, continue to practice mindfulness when eating, and planning for success.  Additional resources provided today: NA  Recommended Physical Activity Goals  Mia Parker has been advised to work up to 150 minutes of moderate intensity aerobic activity a week and strengthening exercises 2-3 times per week for cardiovascular health, weight loss maintenance and preservation of muscle mass.   She has agreed to Continue current level of physical activity  and Work on scheduling and tracking physical activity.    Pharmacotherapy We discussed various medication options to help Mia Parker with her weight loss efforts and we both agreed to continue Wellbutrin for emotional eating and increase metformin to 1000 mg twice daily.    Return in about 2 weeks (around 03/16/2023).Marland Kitchen She was informed of the importance of frequent follow up visits to maximize her success with intensive lifestyle modifications for her multiple health conditions.  PHYSICAL EXAM:  Blood pressure 113/75, pulse 79, temperature 97.6 F (36.4 C), height 5\' 8"  (1.727 m), weight 281 lb (127.5 kg), SpO2 97  %. Body mass index is 42.73 kg/m.  General: She is overweight, cooperative, alert, well developed, and in no acute distress. PSYCH: Has normal mood, affect and thought process.   Lungs: Normal breathing effort, no conversational dyspnea.  DIAGNOSTIC DATA REVIEWED:  BMET    Component Value Date/Time   NA 137 01/27/2023 0821   K 4.2 01/27/2023 0821   CL 102 01/27/2023 0821   CO2 19 (L) 01/27/2023 0821   GLUCOSE 90 01/27/2023 0821   GLUCOSE 90 05/23/2016 1420   BUN 11 01/27/2023 0821   CREATININE 0.86 01/27/2023 0821   CREATININE 0.69 09/14/2015 0001   CALCIUM 9.6 01/27/2023 0821   GFRNONAA 84 02/22/2020 1607   GFRAA 97 02/22/2020 1607   Lab Results  Component Value Date   HGBA1C 5.7 (H) 01/27/2023   HGBA1C 6.0 (H) 10/02/2017   Lab Results  Component Value Date   INSULIN 11.5 01/27/2023   INSULIN 16.7 07/02/2022   Lab Results  Component Value Date   TSH 2.240 07/02/2022   CBC    Component Value Date/Time   WBC 9.2 07/02/2022 0847   WBC 10.7 (H) 05/24/2016 0529   RBC 5.35 (H) 07/02/2022 0847   RBC 4.19 05/24/2016 0529   HGB 15.3 07/02/2022 0847   HGB 13.5 11/08/2015 0000   HCT 45.2 07/02/2022 0847   HCT 41 11/08/2015 0000   PLT 335 07/02/2022 0847   MCV 85 07/02/2022 0847   MCH 28.6 07/02/2022 0847   MCH 27.9 05/24/2016 0529   MCHC 33.8 07/02/2022 0847   MCHC 34.0 05/24/2016  0529   RDW 13.1 07/02/2022 0847   Iron Studies No results found for: "IRON", "TIBC", "FERRITIN", "IRONPCTSAT" Lipid Panel     Component Value Date/Time   CHOL 235 (H) 07/02/2022 0847   TRIG 97 07/02/2022 0847   HDL 89 07/02/2022 0847   CHOLHDL 2.6 07/02/2022 0847   CHOLHDL 3.1 09/14/2015 0001   VLDL 20 09/14/2015 0001   LDLCALC 129 (H) 07/02/2022 0847   Hepatic Function Panel     Component Value Date/Time   PROT 7.2 01/27/2023 0821   ALBUMIN 4.3 01/27/2023 0821   AST 19 01/27/2023 0821   ALT 13 01/27/2023 0821   ALKPHOS 128 (H) 01/27/2023 0821   BILITOT 0.7 01/27/2023  0821      Component Value Date/Time   TSH 2.240 07/02/2022 0847   Nutritional Lab Results  Component Value Date   VD25OH 44.1 01/27/2023   VD25OH 19.0 (L) 07/02/2022    ASSOCIATED CONDITIONS ADDRESSED TODAY  ASSESSMENT AND PLAN  Problem List Items Addressed This Visit     Depression   Relevant Medications   buPROPion (WELLBUTRIN SR) 150 MG 12 hr tablet   Vitamin D deficiency   Relevant Medications   Vitamin D, Ergocalciferol, (DRISDOL) 1.25 MG (50000 UNIT) CAPS capsule   Pre-diabetes - Primary   Relevant Medications   metFORMIN (GLUCOPHAGE) 500 MG tablet   Obesity, Beginning BMI 43.79   Relevant Medications   metFORMIN (GLUCOPHAGE) 500 MG tablet   BMI 40.0-44.9, adult (HCC) Current BMI 42.8   Relevant Medications   metFORMIN (GLUCOPHAGE) 500 MG tablet   Prediabetes: On Metformin for prediabetes. -Increase Metformin to two tablets twice a day.  Last A1c was 5.7  Medication(s): Metformin 500 mg twice daily with meals No Gi side effects with metformin.  Polyphagia:No She is working on nutrition plan to decrease simple carbohydrates, increase lean proteins and exercise to promote weight loss, improve glycemic control and prevent progression to Type 2 diabetes.   Lab Results  Component Value Date   HGBA1C 5.7 (H) 01/27/2023   HGBA1C 5.8 (H) 07/02/2022   HGBA1C 5.7 (A) 02/22/2020   HGBA1C 6.0 (H) 10/02/2017   Lab Results  Component Value Date   INSULIN 11.5 01/27/2023   INSULIN 16.7 07/02/2022    Plan: Continue and increase dose to metformin 1000 mg twice daily. Continue working on nutrition plan to decrease simple carbohydrates, increase lean proteins and exercise to promote weight loss, improve glycemic control and prevent progression to Type 2 diabetes.    Vitamin D deficiency: On vitamin D supplementation. Recent level was 44. -Reduce vitamin D supplementation to once every 14 days.  Vitamin D is not at goal of 50.  Most recent vitamin D level was  44.1-much improved. She is on  prescription ergocalciferol 50,000 IU weekly. Lab Results  Component Value Date   VD25OH 44.1 01/27/2023   VD25OH 19.0 (L) 07/02/2022    Plan: Continue and decrease dose  prescription ergocalciferol 50,000 IU every 14 days Low vitamin D levels can be associated with adiposity and may result in leptin resistance and weight gain. Also associated with fatigue. Currently on vitamin D supplementation without any adverse effects.  Recheck vitamin D every 3 to 4 months to optimize supplementation  Other depression/emotional eating Mia Parker has had issues with stress/emotional eating. Currently this is moderately controlled. Overall mood is stable. Medication(s): Bupropion SR 150 mg daily in am No side effects with bupropion  Plan: Continue and refill Bupropion SR 150 mg daily in am Continue to work  on emotional eating strategies.  May also consider increasing Wellbutrin or perhaps adding Topamax if continues to struggle with cravings/emotional eating. May also want to consider referral to Dr. Rolla Flatten STATEMENTS:  Reviewed by clinician on day of visit: allergies, medications, problem list, medical history, surgical history, family history, social history, and previous encounter notes.   I have personally spent 34 minutes total time today in preparation, patient care, nutritional counseling and documentation for this visit, including the following: review of clinical lab tests; review of medical tests/procedures/services.      Mia Holster, PA-C

## 2023-03-17 ENCOUNTER — Encounter (INDEPENDENT_AMBULATORY_CARE_PROVIDER_SITE_OTHER): Payer: Self-pay | Admitting: Physician Assistant

## 2023-03-17 ENCOUNTER — Ambulatory Visit (INDEPENDENT_AMBULATORY_CARE_PROVIDER_SITE_OTHER): Payer: Medicaid Other | Admitting: Physician Assistant

## 2023-03-17 VITALS — BP 113/74 | HR 79 | Temp 98.1°F | Ht 68.0 in | Wt 282.0 lb

## 2023-03-17 DIAGNOSIS — F3289 Other specified depressive episodes: Secondary | ICD-10-CM

## 2023-03-17 DIAGNOSIS — R7303 Prediabetes: Secondary | ICD-10-CM | POA: Diagnosis not present

## 2023-03-17 DIAGNOSIS — Z6841 Body Mass Index (BMI) 40.0 and over, adult: Secondary | ICD-10-CM

## 2023-03-17 DIAGNOSIS — E669 Obesity, unspecified: Secondary | ICD-10-CM

## 2023-03-17 DIAGNOSIS — E559 Vitamin D deficiency, unspecified: Secondary | ICD-10-CM

## 2023-03-17 NOTE — Progress Notes (Addendum)
.smr  Office: [phone]  /  Fax: [phone]  WEIGHT SUMMARY AND BIOMETRICS  Vitals Temp: 98.1 F (36.7 C) BP: 113/74 Pulse Rate: 79 SpO2: 97 %   Anthropometric Measurements Height: 5\' 8"  (1.727 m) Weight: 282 lb (127.9 kg) BMI (Calculated): 42.89 Weight at Last Visit: 281 lb Weight Lost Since Last Visit: 0 Weight Gained Since Last Visit: 1 lb Starting Weight: 288 lb Total Weight Loss (lbs): 6 lb (2.722 kg) Peak Weight: 288 lb   Body Composition  Body Fat %: 46.5 % Fat Mass (lbs): 131.4 lbs Muscle Mass (lbs): 143.4 lbs Total Body Water (lbs): 97.2 lbs Visceral Fat Rating : 15   Other Clinical Data Fasting: yes Labs: no Today's Visit #: 15 Starting Date: 07/02/22     HPI  Chief Complaint: OBESITY  Mia Parker is here to discuss her progress with her obesity treatment plan. She is on the the Category 3 Plan and states she is following her eating plan approximately 60 % of the time. She states she is exercising walking 30-45 minutes 2 times per week.   Interval History:  Since last office visit she is up 1 lbs. 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 Kids at camp soon and hopes to have more time for planning/prepping.   Hunger/appetite-moderate control Cravings- not excessive Stress- very busy with children's activities and not much time for self care Exercise-walking 2 days but hopeful to increase over the next month Hydration-Adequate.  Protein intake- not always eating lunch. We discussed the importance of regular meals and adequate protein intake for weight loss and overall health. Discussed supplementation with protein shakes if does not stop for lunch.    Pharmacotherapy: metformin for prediabetes. No GI side effects with metformin.  Discussed considering Qsymia to promote weight loss.  She is going to follow up with Dr. Dalbert Garnet over the next month to discuss possible initiation of Qysmia therapy.    TREATMENT PLAN FOR OBESITY:  Recommended Dietary Goals  Aaliyaha is currently in the action stage of change. As such, her goal is to continue weight management plan. She has agreed to the Category 3 Plan.  Behavioral Intervention  We discussed the following Behavioral Modification Strategies today: increasing lean protein intake, decreasing simple carbohydrates , increasing vegetables, increasing lower glycemic fruits, avoiding skipping meals, increasing water intake, work on managing stress, creating time for self-care and relaxation measures, continue to practice mindfulness when eating, and planning for success.  Additional resources provided today: NA  Recommended Physical Activity Goals  Clatie has been advised to work up to 150 minutes of moderate intensity aerobic activity a week and strengthening exercises 2-3 times per week for cardiovascular health, weight loss maintenance and preservation of muscle mass.   She has agreed to Continue current level of physical activity , Think about ways to increase daily physical activity and overcoming barriers to exercise, and Increase physical activity in their day and reduce sedentary time (increase NEAT).   Pharmacotherapy We discussed various medication options to help Lyzette with her weight loss efforts and we both agreed to continue metformin for prediabetes. We did discuss seeing Dr. Dalbert Garnet over the next month to consider possibly getting started with Qsymia.     Return in about 2 weeks (around 03/31/2023).Marland Kitchen She was informed of the importance of frequent follow up visits to maximize her success with intensive lifestyle modifications for her multiple health conditions.  PHYSICAL EXAM:  Blood pressure 113/74, pulse  79, temperature 98.1 F (36.7 C), height 5\' 8"  (1.727 m), weight 282 lb (127.9 kg), SpO2 97 %. Body mass index is 42.88 kg/m.  General: She is overweight, cooperative, alert, well developed, and in no acute  distress. PSYCH: Has normal mood, affect and thought process.   Cardiovascular: HR 70's regular, BP 113/74 Lungs: Normal breathing effort, no conversational dyspnea. Neuro: no focal deficits  DIAGNOSTIC DATA REVIEWED:  BMET    Component Value Date/Time   NA 137 01/27/2023 0821   K 4.2 01/27/2023 0821   CL 102 01/27/2023 0821   CO2 19 (L) 01/27/2023 0821   GLUCOSE 90 01/27/2023 0821   GLUCOSE 90 05/23/2016 1420   BUN 11 01/27/2023 0821   CREATININE 0.86 01/27/2023 0821   CREATININE 0.69 09/14/2015 0001   CALCIUM 9.6 01/27/2023 0821   GFRNONAA 84 02/22/2020 1607   GFRAA 97 02/22/2020 1607   Lab Results  Component Value Date   HGBA1C 5.7 (H) 01/27/2023   HGBA1C 6.0 (H) 10/02/2017   Lab Results  Component Value Date   INSULIN 11.5 01/27/2023   INSULIN 16.7 07/02/2022   Lab Results  Component Value Date   TSH 2.240 07/02/2022   CBC    Component Value Date/Time   WBC 9.2 07/02/2022 0847   WBC 10.7 (H) 05/24/2016 0529   RBC 5.35 (H) 07/02/2022 0847   RBC 4.19 05/24/2016 0529   HGB 15.3 07/02/2022 0847   HGB 13.5 11/08/2015 0000   HCT 45.2 07/02/2022 0847   HCT 41 11/08/2015 0000   PLT 335 07/02/2022 0847   MCV 85 07/02/2022 0847   MCH 28.6 07/02/2022 0847   MCH 27.9 05/24/2016 0529   MCHC 33.8 07/02/2022 0847   MCHC 34.0 05/24/2016 0529   RDW 13.1 07/02/2022 0847   Iron Studies No results found for: "IRON", "TIBC", "FERRITIN", "IRONPCTSAT" Lipid Panel     Component Value Date/Time   CHOL 235 (H) 07/02/2022 0847   TRIG 97 07/02/2022 0847   HDL 89 07/02/2022 0847   CHOLHDL 2.6 07/02/2022 0847   CHOLHDL 3.1 09/14/2015 0001   VLDL 20 09/14/2015 0001   LDLCALC 129 (H) 07/02/2022 0847   Hepatic Function Panel     Component Value Date/Time   PROT 7.2 01/27/2023 0821   ALBUMIN 4.3 01/27/2023 0821   AST 19 01/27/2023 0821   ALT 13 01/27/2023 0821   ALKPHOS 128 (H) 01/27/2023 0821   BILITOT 0.7 01/27/2023 0821      Component Value Date/Time   TSH 2.240  07/02/2022 0847   Nutritional Lab Results  Component Value Date   VD25OH 44.1 01/27/2023   VD25OH 19.0 (L) 07/02/2022    ASSOCIATED CONDITIONS ADDRESSED TODAY  ASSESSMENT AND PLAN  Problem List Items Addressed This Visit     Depression   Vitamin D deficiency   Pre-diabetes - Primary   Obesity, Beginning BMI 43.79   BMI 40.0-44.9, adult (HCC) Current BMI 42.8   Prediabetes Last A1c was 5.7- improved, but not at goal. Insulin 11.5- improved but not at goal.   Medication(s):  metformin 1000 mg twice daily. No GI or other side effects with metformin Polyphagia:No She is working on nutrition plan to decrease simple carbohydrates, increase lean proteins and exercise to promote weight loss, improve glycemic control and prevent progression to Type 2 diabetes.   Lab Results  Component Value Date   HGBA1C 5.7 (H) 01/27/2023   HGBA1C 5.8 (H) 07/02/2022   HGBA1C 5.7 (A) 02/22/2020   HGBA1C 6.0 (H) 10/02/2017  Lab Results  Component Value Date   INSULIN 11.5 01/27/2023   INSULIN 16.7 07/02/2022    Plan: Continue  metformin 1000 mg twice daily. No refill needed this visit.  Continue working on nutrition plan to decrease simple carbohydrates, increase lean proteins and exercise to promote weight loss, improve glycemic control and prevent progression to Type 2 diabetes.    Vitamin D Deficiency Vitamin D is not at goal of 50.  Most recent vitamin D level was 44.1- improved. She is on  prescription ergocalciferol 50,000 IU every 14 days. Lab Results  Component Value Date   VD25OH 44.1 01/27/2023   VD25OH 19.0 (L) 07/02/2022    Plan: Continue  prescription ergocalciferol 50,000 IU every 14 days No refill needed this visit.  Low vitamin D levels can be associated with adiposity and may result in leptin resistance and weight gain. Also associated with fatigue. Currently on vitamin D supplementation without any adverse effects.    Other depression/emotional eating Shawnay has  had issues with stress/emotional eating. Currently this is moderately controlled. Overall mood is stable. Medication(s): Bupropion SR 150 mg daily in am  Plan: Continue Bupropion SR 150 mg daily in am No refill needed this visit.  Continue to work on emotional eating strategies.   ATTESTASTION STATEMENTS:  Reviewed by clinician on day of visit: allergies, medications, problem list, medical history, surgical history, family history, social history, and previous encounter notes.   I have personally spent 32 minutes total time today in preparation, patient care, nutritional counseling and documentation for this visit, including the following: review of clinical lab tests; review of medical tests/procedures/services.      Latifah Padin, PA-C

## 2023-03-31 ENCOUNTER — Encounter (INDEPENDENT_AMBULATORY_CARE_PROVIDER_SITE_OTHER): Payer: Self-pay | Admitting: Physician Assistant

## 2023-03-31 ENCOUNTER — Ambulatory Visit (INDEPENDENT_AMBULATORY_CARE_PROVIDER_SITE_OTHER): Payer: Medicaid Other | Admitting: Physician Assistant

## 2023-03-31 VITALS — BP 112/71 | HR 77 | Temp 98.0°F | Ht 68.0 in | Wt 281.0 lb

## 2023-03-31 DIAGNOSIS — F32A Depression, unspecified: Secondary | ICD-10-CM | POA: Diagnosis not present

## 2023-03-31 DIAGNOSIS — R7303 Prediabetes: Secondary | ICD-10-CM

## 2023-03-31 DIAGNOSIS — Z6841 Body Mass Index (BMI) 40.0 and over, adult: Secondary | ICD-10-CM

## 2023-03-31 DIAGNOSIS — E559 Vitamin D deficiency, unspecified: Secondary | ICD-10-CM

## 2023-03-31 DIAGNOSIS — E669 Obesity, unspecified: Secondary | ICD-10-CM | POA: Diagnosis not present

## 2023-03-31 DIAGNOSIS — F3289 Other specified depressive episodes: Secondary | ICD-10-CM

## 2023-03-31 MED ORDER — VITAMIN D (ERGOCALCIFEROL) 1.25 MG (50000 UNIT) PO CAPS
50000.0000 [IU] | ORAL_CAPSULE | ORAL | 0 refills | Status: DC
Start: 2023-03-31 — End: 2023-06-01

## 2023-03-31 MED ORDER — BUPROPION HCL ER (SR) 150 MG PO TB12: 150.0000 mg | ORAL_TABLET | Freq: Every day | ORAL | 0 refills | Status: DC

## 2023-03-31 MED ORDER — METFORMIN HCL 500 MG PO TABS
1000.0000 mg | ORAL_TABLET | Freq: Two times a day (BID) | ORAL | 0 refills | Status: DC
Start: 2023-03-31 — End: 2023-06-01

## 2023-03-31 NOTE — Progress Notes (Signed)
.smr  Office: [phone]  /  Fax: [phone]  WEIGHT SUMMARY AND BIOMETRICS  Vitals Temp: 98 F (36.7 C) BP: 112/71 Pulse Rate: 77 SpO2: 96 %   Anthropometric Measurements Height: 5\' 8"  (1.727 m) Weight: 281 lb (127.5 kg) BMI (Calculated): 42.74 Weight at Last Visit: 282 lb Weight Lost Since Last Visit: 1 lb Weight Gained Since Last Visit: 0 Starting Weight: 288 lb Total Weight Loss (lbs): 7 lb (3.175 kg)   Body Composition  Body Fat %: 43.6 % Fat Mass (lbs): 122.8 lbs Muscle Mass (lbs): 150.8 lbs Total Body Water (lbs): 96.4 lbs Visceral Fat Rating : 14   Other Clinical Data Fasting: No Labs: No Today's Visit #: 16 Starting Date: 07/02/22     HPI  Chief Complaint: OBESITY  Mia Parker is here to discuss her progress with her obesity treatment plan. She is on the the Category 3 Plan and keeping a food journal and adhering to recommended goals of 1500-1600 calories and 100 grams of protein and states she is following her eating plan approximately 40-50 % of the time. She states she is exercising walking 30 minutes 2 times per week.  Discussed the use of AI scribe software for clinical note transcription with the patient, who gave verbal consent to proceed.  History of Present Illness        The patient, with a history of obesity, prediabetes, vitamin D deficiency, and emotional eating behavior, presents for a follow-up visit. 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 Despite these challenges, they have lost a pound since their last visit. They are currently on a regimen of bupropion and metformin, which they tolerate well. They have planned meals for the upcoming days and have been swimming regularly, albeit not consistently. Their diet consists of yogurt and muscle milk or eggs for breakfast, and often they skip lunch. They also consume yogurt throughout the day. They  are due to increase their metformin dosage to 1000mg  twice a day. They are also considering the addition of a stimulant medication to their regimen to promote weight loss.   Interval History:  Since last office visit she is down 1 lb. / Down total of 7 lbs since 07/12/2022  TBW loss 2.4% since 07/12/2022 Bio impedence scale reviewed with patient: Up 7.4 lbs muscle mass Down 8.6 lbs adipose mass Down 0.8 lb  total body water  Hunger/appetite-fair to moderate control She is skipping lunch frequently over the past few weeks. We discussed the importance of regular meals and adequate protein intake for weight loss and overall health.  Cravings- denies excessive cravings Stress- manageable, but very busy with children's schedules  Exercise-not consistenly   Pharmacotherapy: metformin 1000 mg twice daily for primary indication of prediabetes. No GI or other side effects with metformin. She has had a nice improvement over the past month following the increase in metformin, but she is frustrated by the slowness of her progress.   She previously took Adderall for ADHD but has not taken in several years. Discussed considering Qsymia to promote weight loss.  She is going to follow up with Dr. Dalbert Garnet over the next week to discuss possible initiation of Qysmia therapy.   She is also taking Wellbutrin 150 mg for emotional eating/cravings . No side effects and does feel it is helping some to curb cravings.  TREATMENT PLAN FOR OBESITY: Obesity: Struggling with maintaining a consistent exercise routine and healthy eating habits. Noted  a 1 lb weight loss. Discussed the importance of regular physical activity and balanced meals. -Continue current exercise regimen, including swimming and walking. -Encouraged to maintain a regular meal schedule to prevent overeating. -Consider increasing physical activity, such as paddle boarding or kayaking, during vacation. Recommended Dietary Goals  Mia Parker is  currently in the action stage of change. As such, her goal is to continue weight management plan. She has agreed to the Category 3 Plan and keeping a food journal and adhering to recommended goals of 1500-1600 calories and 100 grams of protein.  Behavioral Intervention  We discussed the following Behavioral Modification Strategies today: increasing lean protein intake, decreasing simple carbohydrates , increasing vegetables, increasing lower glycemic fruits, avoiding skipping meals, increasing water intake, work on meal planning and preparation, keeping healthy foods at home, emotional eating strategies and understanding the difference between hunger signals and cravings, continue to practice mindfulness when eating, and planning for success.  Additional resources provided today: NA  Recommended Physical Activity Goals  Mia Parker has been advised to work up to 150 minutes of moderate intensity aerobic activity a week and strengthening exercises 2-3 times per week for cardiovascular health, weight loss maintenance and preservation of muscle mass.   She has agreed to Think about ways to increase daily physical activity and overcoming barriers to exercise, Increase physical activity in their day and reduce sedentary time (increase NEAT)., and Work on scheduling and tracking physical activity.    Pharmacotherapy We discussed various medication options to help Mia Parker with her weight loss efforts and we both agreed to continue metformin for primary indication of prediabetes.    Return in about 4 weeks (around 04/28/2023).Marland Kitchen She was informed of the importance of frequent follow up visits to maximize her success with intensive lifestyle modifications for her multiple health conditions.  PHYSICAL EXAM:  Blood pressure 112/71, pulse 77, temperature 98 F (36.7 C), height 5\' 8"  (1.727 m), weight 281 lb (127.5 kg), SpO2 96%. Body mass index is 42.73 kg/m.  General: She is overweight, cooperative, alert,  well developed, and in no acute distress. PSYCH: Has normal mood, affect and thought process.   Cardiovascular: HR 70's regular, BP 112/71 Lungs: Normal breathing effort, no conversational dyspnea. Neuro: no focal deficits  DIAGNOSTIC DATA REVIEWED:  BMET    Component Value Date/Time   NA 137 01/27/2023 0821   K 4.2 01/27/2023 0821   CL 102 01/27/2023 0821   CO2 19 (L) 01/27/2023 0821   GLUCOSE 90 01/27/2023 0821   GLUCOSE 90 05/23/2016 1420   BUN 11 01/27/2023 0821   CREATININE 0.86 01/27/2023 0821   CREATININE 0.69 09/14/2015 0001   CALCIUM 9.6 01/27/2023 0821   GFRNONAA 84 02/22/2020 1607   GFRAA 97 02/22/2020 1607   Lab Results  Component Value Date   HGBA1C 5.7 (H) 01/27/2023   HGBA1C 6.0 (H) 10/02/2017   Lab Results  Component Value Date   INSULIN 11.5 01/27/2023   INSULIN 16.7 07/02/2022   Lab Results  Component Value Date   TSH 2.240 07/02/2022   CBC    Component Value Date/Time   WBC 9.2 07/02/2022 0847   WBC 10.7 (H) 05/24/2016 0529   RBC 5.35 (H) 07/02/2022 0847   RBC 4.19 05/24/2016 0529   HGB 15.3 07/02/2022 0847   HGB 13.5 11/08/2015 0000   HCT 45.2 07/02/2022 0847   HCT 41 11/08/2015 0000   PLT 335 07/02/2022 0847   MCV 85 07/02/2022 0847   MCH 28.6 07/02/2022 0847   MCH 27.9  05/24/2016 0529   MCHC 33.8 07/02/2022 0847   MCHC 34.0 05/24/2016 0529   RDW 13.1 07/02/2022 0847   Iron Studies No results found for: "IRON", "TIBC", "FERRITIN", "IRONPCTSAT" Lipid Panel     Component Value Date/Time   CHOL 235 (H) 07/02/2022 0847   TRIG 97 07/02/2022 0847   HDL 89 07/02/2022 0847   CHOLHDL 2.6 07/02/2022 0847   CHOLHDL 3.1 09/14/2015 0001   VLDL 20 09/14/2015 0001   LDLCALC 129 (H) 07/02/2022 0847   Hepatic Function Panel     Component Value Date/Time   PROT 7.2 01/27/2023 0821   ALBUMIN 4.3 01/27/2023 0821   AST 19 01/27/2023 0821   ALT 13 01/27/2023 0821   ALKPHOS 128 (H) 01/27/2023 0821   BILITOT 0.7 01/27/2023 0821       Component Value Date/Time   TSH 2.240 07/02/2022 0847   Nutritional Lab Results  Component Value Date   VD25OH 44.1 01/27/2023   VD25OH 19.0 (L) 07/02/2022    ASSOCIATED CONDITIONS ADDRESSED TODAY  ASSESSMENT AND PLAN  Problem List Items Addressed This Visit     Depression   Relevant Medications   buPROPion (WELLBUTRIN SR) 150 MG 12 hr tablet   Vitamin D deficiency   Relevant Medications   Vitamin D, Ergocalciferol, (DRISDOL) 1.25 MG (50000 UNIT) CAPS capsule   Pre-diabetes   Relevant Medications   metFORMIN (GLUCOPHAGE) 500 MG tablet   Obesity, Beginning BMI 43.79 - Primary   Relevant Medications   metFORMIN (GLUCOPHAGE) 500 MG tablet   BMI 40.0-44.9, adult (HCC) Current BMI 42.8   Relevant Medications   metFORMIN (GLUCOPHAGE) 500 MG tablet   Prediabetes Last A1c was 5.7/ Insulin 11.5- not at goals  Medication(s):  metformin 1000 mg twice daily. No GI or other side effects with metformin.  Polyphagia:Yes Lab Results  Component Value Date   HGBA1C 5.7 (H) 01/27/2023   HGBA1C 5.8 (H) 07/02/2022   HGBA1C 5.7 (A) 02/22/2020   HGBA1C 6.0 (H) 10/02/2017   Lab Results  Component Value Date   INSULIN 11.5 01/27/2023   INSULIN 16.7 07/02/2022    Plan: Continue and refill  metformin 1000 mg twice daily.  Continue working on nutrition plan to decrease simple carbohydrates, increase lean proteins and exercise to promote weight loss, improve glycemic control and prevent progression to Type 2 diabetes.  Prediabetes: On Metformin 1000mg  twice daily. No reported side effects. -Continue Metformin 1000mg  twice daily. -Check insulin level at next visit.  Vitamin D Deficiency: On Vitamin D supplementation every other week. No reported side effects. -Continue Vitamin D supplementation as prescribed.  Emotional Eating: On Wellbutrin 150mg  once daily. Discussed the possibility of increasing the dose or adding a stimulant medication to control cravings. -Continue Wellbutrin  150mg  once daily. -Discuss potential addition of phentermine with Dr. Dalbert Garnet at next visit.  Follow-up: Next appointment scheduled for 04/09/2023 to discuss potential addition of phentermine. Further follow-up scheduled for 04/29/2023 to assess progress.  ATTESTASTION STATEMENTS:  Reviewed by clinician on day of visit: allergies, medications, problem list, medical history, surgical history, family history, social history, and previous encounter notes.   I have personally spent 38 minutes total time today in preparation, patient care, nutritional counseling and documentation for this visit, including the following: review of clinical lab tests; review of medical tests/procedures/services.      Mia Benbrook, PA-C

## 2023-04-09 ENCOUNTER — Ambulatory Visit (INDEPENDENT_AMBULATORY_CARE_PROVIDER_SITE_OTHER): Payer: Medicaid Other | Admitting: Family Medicine

## 2023-04-09 ENCOUNTER — Encounter (INDEPENDENT_AMBULATORY_CARE_PROVIDER_SITE_OTHER): Payer: Self-pay | Admitting: Family Medicine

## 2023-04-09 VITALS — BP 125/73 | HR 70 | Temp 97.9°F | Ht 68.0 in | Wt 288.0 lb

## 2023-04-09 DIAGNOSIS — E669 Obesity, unspecified: Secondary | ICD-10-CM

## 2023-04-09 DIAGNOSIS — Z6841 Body Mass Index (BMI) 40.0 and over, adult: Secondary | ICD-10-CM

## 2023-04-09 DIAGNOSIS — F3289 Other specified depressive episodes: Secondary | ICD-10-CM

## 2023-04-09 DIAGNOSIS — R7303 Prediabetes: Secondary | ICD-10-CM | POA: Diagnosis not present

## 2023-04-09 MED ORDER — BUPROPION HCL ER (SR) 150 MG PO TB12
150.0000 mg | ORAL_TABLET | Freq: Two times a day (BID) | ORAL | 0 refills | Status: DC
Start: 2023-04-09 — End: 2023-06-01

## 2023-04-13 NOTE — Progress Notes (Signed)
Chief Complaint:   OBESITY Mia Parker is here to discuss her progress with her obesity treatment plan along with follow-up of her obesity related diagnoses. Mia Parker is on the Category 3 Plan and keeping a food journal and adhering to recommended goals of 1500-1600 calories and 100 grams of protein and states she is following her eating plan approximately 50% of the time. Mia Parker states she is walking and swimming for 30 minutes 3 times per week.  Today's visit was #: 17 Starting weight: 288 lbs Starting date: 07/02/2022 Today's weight: 288 lbs Today's date: 04/09/2023 Total lbs lost to date: 0 Total lbs lost since last in-office visit: 0  Interim History: Patient has been on vacation and although she was active, there was extra temptations and less ideal food available.  Subjective:   1. Prediabetes Patient is doing well on metformin.  She has done more celebration eating while on vacation this summer, but she is still trying to be mindful.  2. Emotional Eating Behavior Patient is on Wellbutrin.  She notes still struggling with cravings more than hunger.  She has no problems with Wellbutrin.  Assessment/Plan:   1. Prediabetes Patient will continue metformin and her diet, and we will recheck labs this Fall.  2. Emotional Eating Behavior Patient agreed to increase Wellbutrin SR to 150 mg twice daily, and we will refill for 1 month.  We will follow-up at her next visit in 4 weeks.  - buPROPion (WELLBUTRIN SR) 150 MG 12 hr tablet; Take 1 tablet (150 mg total) by mouth 2 (two) times daily.  Dispense: 60 tablet; Refill: 0  3. BMI 40.0-44.9, adult (HCC) Current BMI 42.8  4. Obesity, Beginning BMI 43.79 Mia Parker is currently in the action stage of change. As such, her goal is to continue with weight loss efforts. She has agreed to the Category 3 Plan.   Patient was educated on the 3 types of hunger.  She feels the neurological hunger has been the strongest, so we will work on this  first.  Exercise goals: As is.   Behavioral modification strategies: increasing lean protein intake, meal planning and cooking strategies, and travel eating strategies.  Mia Parker has agreed to follow-up with our clinic in 4 weeks. She was informed of the importance of frequent follow-up visits to maximize her success with intensive lifestyle modifications for her multiple health conditions.   Objective:   Blood pressure 125/73, pulse 70, temperature 97.9 F (36.6 C), height 5\' 8"  (1.727 m), weight 288 lb (130.6 kg), SpO2 99%. Body mass index is 43.79 kg/m.  Lab Results  Component Value Date   CREATININE 0.86 01/27/2023   BUN 11 01/27/2023   NA 137 01/27/2023   K 4.2 01/27/2023   CL 102 01/27/2023   CO2 19 (L) 01/27/2023   Lab Results  Component Value Date   ALT 13 01/27/2023   AST 19 01/27/2023   ALKPHOS 128 (H) 01/27/2023   BILITOT 0.7 01/27/2023   Lab Results  Component Value Date   HGBA1C 5.7 (H) 01/27/2023   HGBA1C 5.8 (H) 07/02/2022   HGBA1C 5.7 (A) 02/22/2020   HGBA1C 6.0 (H) 10/02/2017   Lab Results  Component Value Date   INSULIN 11.5 01/27/2023   INSULIN 16.7 07/02/2022   Lab Results  Component Value Date   TSH 2.240 07/02/2022   Lab Results  Component Value Date   CHOL 235 (H) 07/02/2022   HDL 89 07/02/2022   LDLCALC 129 (H) 07/02/2022   TRIG 97 07/02/2022  CHOLHDL 2.6 07/02/2022   Lab Results  Component Value Date   VD25OH 44.1 01/27/2023   VD25OH 19.0 (L) 07/02/2022   Lab Results  Component Value Date   WBC 9.2 07/02/2022   HGB 15.3 07/02/2022   HCT 45.2 07/02/2022   MCV 85 07/02/2022   PLT 335 07/02/2022   No results found for: "IRON", "TIBC", "FERRITIN"  Attestation Statements:   Reviewed by clinician on day of visit: allergies, medications, problem list, medical history, surgical history, family history, social history, and previous encounter notes.   I, Burt Knack, am acting as transcriptionist for Quillian Quince, MD.  I  have reviewed the above documentation for accuracy and completeness, and I agree with the above. -  Quillian Quince, MD

## 2023-04-28 NOTE — Progress Notes (Signed)
.smr  Office: [phone]  /  Fax: [phone]  WEIGHT SUMMARY AND BIOMETRICS  Vitals Temp: 98 F (36.7 C) BP: 111/73 Pulse Rate: 72 SpO2: 99 %   Anthropometric Measurements Height: 5\' 8"  (1.727 m) Weight: 283 lb (128.4 kg) BMI (Calculated): 43.04 Weight at Last Visit: 288lb Weight Lost Since Last Visit: 5lb Weight Gained Since Last Visit: 0 Starting Weight: 288lb Total Weight Loss (lbs): 5 lb (2.268 kg)   Body Composition  Body Fat %: 44.2 % Fat Mass (lbs): 125.2 lbs Muscle Mass (lbs): 150.2 lbs Total Body Water (lbs): 97 lbs Visceral Fat Rating : 14   Other Clinical Data Fasting: No Labs: no Today's Visit #: 18 Starting Date: 07/02/22     HPI  Chief Complaint: OBESITY  Magdalina is here to discuss her progress with her obesity treatment plan. She is on the the Category 3 Plan and keeping a food journal and adhering to recommended goals of 1500-1600 calories and 100 grams of protein and states she is following her eating plan approximately 40 % of the time. She states she is exercising walking/swimming 30 minutes 4 times per week.   Interval History:  Since last office visit she is down 5 lbs.  Vacationing with family and did not take medications .  Getting back on track with nutrition plan and medications.   Hunger/appetite-fair control Cravings- some indulgences over summer, but trying to be more mindful of choices Stress- very manageable, not stressed at all Sleep- working on having the kids Exercise-Plans to start walking kids to school and then walking with friend while kids in school. Also plans to go to [facility] gym to swim    Pharmacotherapy: metformin 1000 mg twice daily for primary indication of prediabetes. No GI or other side effects with metformin. She has had a nice improvement over the past month following the increase in metformin, but she is frustrated by the slowness of her progress.   TREATMENT PLAN FOR OBESITY:  Recommended Dietary  Goals  Anjelia is currently in the action stage of change. As such, her goal is to continue weight management plan. She has agreed to the Category 3 Plan.  Behavioral Intervention  We discussed the following Behavioral Modification Strategies today: increasing lean protein intake, decreasing simple carbohydrates , increasing vegetables, increasing lower glycemic fruits, increasing water intake, work on meal planning and preparation, keeping healthy foods at home, emotional eating strategies and understanding the difference between hunger signals and cravings, work on managing stress, creating time for self-care and relaxation measures, continue to practice mindfulness when eating, and planning for success.  Additional resources provided today: NA  Recommended Physical Activity Goals  Kerbi has been advised to work up to 150 minutes of moderate intensity aerobic activity a week and strengthening exercises 2-3 times per week for cardiovascular health, weight loss maintenance and preservation of muscle mass.   She has agreed to Continue current level of physical activity  and Increase the intensity, frequency or duration of aerobic exercises     Pharmacotherapy We discussed various medication options to help Adorabella with her weight loss efforts and we both agreed to resume metformin 1000 mg twice daily.    Return in about 4 weeks (around 05/27/2023).Marland Kitchen She was informed of the importance of frequent follow up visits to maximize her success with intensive lifestyle modifications for her multiple health conditions.  PHYSICAL EXAM:  Blood pressure 111/73, pulse 72, temperature 98 F (36.7 C), height 5\' 8"  (1.727 m), weight 283 lb (128.4 kg),  SpO2 99%. Body mass index is 43.03 kg/m.  General: She is overweight, cooperative, alert, well developed, and in no acute distress. PSYCH: Has normal mood, affect and thought process.   Cardiovascular: HR 70's, BP 111/73 Lungs: Normal breathing effort, no  conversational dyspnea. Neuro: no focal deficits  DIAGNOSTIC DATA REVIEWED:  BMET    Component Value Date/Time   NA 137 01/27/2023 0821   K 4.2 01/27/2023 0821   CL 102 01/27/2023 0821   CO2 19 (L) 01/27/2023 0821   GLUCOSE 90 01/27/2023 0821   GLUCOSE 90 05/23/2016 1420   BUN 11 01/27/2023 0821   CREATININE 0.86 01/27/2023 0821   CREATININE 0.69 09/14/2015 0001   CALCIUM 9.6 01/27/2023 0821   GFRNONAA 84 02/22/2020 1607   GFRAA 97 02/22/2020 1607   Lab Results  Component Value Date   HGBA1C 5.7 (H) 01/27/2023   HGBA1C 6.0 (H) 10/02/2017   Lab Results  Component Value Date   INSULIN 11.5 01/27/2023   INSULIN 16.7 07/02/2022   Lab Results  Component Value Date   TSH 2.240 07/02/2022   CBC    Component Value Date/Time   WBC 9.2 07/02/2022 0847   WBC 10.7 (H) 05/24/2016 0529   RBC 5.35 (H) 07/02/2022 0847   RBC 4.19 05/24/2016 0529   HGB 15.3 07/02/2022 0847   HGB 13.5 11/08/2015 0000   HCT 45.2 07/02/2022 0847   HCT 41 11/08/2015 0000   PLT 335 07/02/2022 0847   MCV 85 07/02/2022 0847   MCH 28.6 07/02/2022 0847   MCH 27.9 05/24/2016 0529   MCHC 33.8 07/02/2022 0847   MCHC 34.0 05/24/2016 0529   RDW 13.1 07/02/2022 0847   Iron Studies No results found for: "IRON", "TIBC", "FERRITIN", "IRONPCTSAT" Lipid Panel     Component Value Date/Time   CHOL 235 (H) 07/02/2022 0847   TRIG 97 07/02/2022 0847   HDL 89 07/02/2022 0847   CHOLHDL 2.6 07/02/2022 0847   CHOLHDL 3.1 09/14/2015 0001   VLDL 20 09/14/2015 0001   LDLCALC 129 (H) 07/02/2022 0847   Hepatic Function Panel     Component Value Date/Time   PROT 7.2 01/27/2023 0821   ALBUMIN 4.3 01/27/2023 0821   AST 19 01/27/2023 0821   ALT 13 01/27/2023 0821   ALKPHOS 128 (H) 01/27/2023 0821   BILITOT 0.7 01/27/2023 0821      Component Value Date/Time   TSH 2.240 07/02/2022 0847   Nutritional Lab Results  Component Value Date   VD25OH 44.1 01/27/2023   VD25OH 19.0 (L) 07/02/2022    ASSOCIATED  CONDITIONS ADDRESSED TODAY  ASSESSMENT AND PLAN  Problem List Items Addressed This Visit     Depression   Vitamin D deficiency   Prediabetes - Primary   Obesity, Beginning BMI 43.79   BMI 40.0-44.9, adult (HCC) Current BMI 42.8   Prediabetes Last A1c was 5.7- nearing goal/ Insulin 11.5- improved, but not at goal.   Medication(s):  metformin 1000 mg twice daily  Did not take while on vacation and getting back on track with medications.  No GI side effects with metformin.  Polyphagia:Yes Lab Results  Component Value Date   HGBA1C 5.7 (H) 01/27/2023   HGBA1C 5.8 (H) 07/02/2022   HGBA1C 5.7 (A) 02/22/2020   HGBA1C 6.0 (H) 10/02/2017   Lab Results  Component Value Date   INSULIN 11.5 01/27/2023   INSULIN 16.7 07/02/2022    Plan: Continue  metformin 1000 mg twice daily.  Continue working on nutrition plan to decrease simple carbohydrates,  increase lean proteins and exercise to promote weight loss, improve glycemic control and prevent progression to Type 2 diabetes.  Recheck labs in 2-3 months.   Vitamin D Deficiency Vitamin D is not at goal of 50.  Most recent vitamin D level was 44.1- much improved. She is on  prescription ergocalciferol 50,000 IU every 14 days. Lab Results  Component Value Date   VD25OH 44.1 01/27/2023   VD25OH 19.0 (L) 07/02/2022    Plan: Continue  prescription ergocalciferol 50,000 IU every 14 days Low vitamin D levels can be associated with adiposity and may result in leptin resistance and weight gain. Also associated with fatigue. Currently on vitamin D supplementation without any adverse effects.  Recheck vitamin D level in 2-3 months as resumes taking consistently to optimize supplemenation and avoid over supplementation.   Other depression/emotional eating Marquila has had issues with stress/emotional eating. Currently this is poorly controlled. Overall mood is stable. Medication(s): Bupropion SR 150 mg twice daily She has not been taking and plans  to get back on track with medications.   Plan: Continue Bupropion SR 150 mg twice daily Continue to work on emotional eating strategies.   Intensive lifestyle modifications are the first line treatment for this issue. We discussed several lifestyle modifications today and she will continue to work on diet, exercise and weight loss efforts. We will continue to monitor. Orders and follow up as documented in patient record.    ATTESTASTION STATEMENTS:  Reviewed by clinician on day of visit: allergies, medications, problem list, medical history, surgical history, family history, social history, and previous encounter notes.   I have personally spent 44 minutes total time today in preparation, patient care, nutritional counseling and documentation for this visit, including the following: review of clinical lab tests; review of medical tests/procedures/services.      Layken Doenges, PA-C

## 2023-04-29 ENCOUNTER — Encounter (INDEPENDENT_AMBULATORY_CARE_PROVIDER_SITE_OTHER): Payer: Self-pay | Admitting: Physician Assistant

## 2023-04-29 ENCOUNTER — Ambulatory Visit (INDEPENDENT_AMBULATORY_CARE_PROVIDER_SITE_OTHER): Payer: Medicaid Other | Admitting: Physician Assistant

## 2023-04-29 VITALS — BP 111/73 | HR 72 | Temp 98.0°F | Ht 68.0 in | Wt 283.0 lb

## 2023-04-29 DIAGNOSIS — E559 Vitamin D deficiency, unspecified: Secondary | ICD-10-CM | POA: Diagnosis not present

## 2023-04-29 DIAGNOSIS — G4733 Obstructive sleep apnea (adult) (pediatric): Secondary | ICD-10-CM

## 2023-04-29 DIAGNOSIS — R7303 Prediabetes: Secondary | ICD-10-CM | POA: Diagnosis not present

## 2023-04-29 DIAGNOSIS — F3289 Other specified depressive episodes: Secondary | ICD-10-CM | POA: Diagnosis not present

## 2023-04-29 DIAGNOSIS — E669 Obesity, unspecified: Secondary | ICD-10-CM | POA: Diagnosis not present

## 2023-04-29 DIAGNOSIS — Z6841 Body Mass Index (BMI) 40.0 and over, adult: Secondary | ICD-10-CM | POA: Diagnosis not present

## 2023-06-01 ENCOUNTER — Other Ambulatory Visit (INDEPENDENT_AMBULATORY_CARE_PROVIDER_SITE_OTHER): Payer: Self-pay | Admitting: Physician Assistant

## 2023-06-01 ENCOUNTER — Ambulatory Visit (INDEPENDENT_AMBULATORY_CARE_PROVIDER_SITE_OTHER): Payer: Medicaid Other | Admitting: Physician Assistant

## 2023-06-01 ENCOUNTER — Encounter (INDEPENDENT_AMBULATORY_CARE_PROVIDER_SITE_OTHER): Payer: Self-pay | Admitting: Physician Assistant

## 2023-06-01 VITALS — BP 131/77 | HR 67 | Temp 98.0°F | Ht 68.0 in | Wt 283.0 lb

## 2023-06-01 DIAGNOSIS — E669 Obesity, unspecified: Secondary | ICD-10-CM | POA: Diagnosis not present

## 2023-06-01 DIAGNOSIS — F5089 Other specified eating disorder: Secondary | ICD-10-CM | POA: Diagnosis not present

## 2023-06-01 DIAGNOSIS — Z6841 Body Mass Index (BMI) 40.0 and over, adult: Secondary | ICD-10-CM | POA: Diagnosis not present

## 2023-06-01 DIAGNOSIS — F3289 Other specified depressive episodes: Secondary | ICD-10-CM

## 2023-06-01 DIAGNOSIS — E559 Vitamin D deficiency, unspecified: Secondary | ICD-10-CM | POA: Diagnosis not present

## 2023-06-01 DIAGNOSIS — R7303 Prediabetes: Secondary | ICD-10-CM

## 2023-06-01 MED ORDER — BUPROPION HCL ER (SR) 150 MG PO TB12: 150.0000 mg | ORAL_TABLET | Freq: Two times a day (BID) | ORAL | 0 refills | Status: DC

## 2023-06-01 MED ORDER — VITAMIN D (ERGOCALCIFEROL) 1.25 MG (50000 UNIT) PO CAPS: 50000.0000 [IU] | ORAL_CAPSULE | ORAL | 0 refills | Status: DC

## 2023-06-01 MED ORDER — METFORMIN HCL 500 MG PO TABS
1000.0000 mg | ORAL_TABLET | Freq: Two times a day (BID) | ORAL | 0 refills | Status: DC
Start: 2023-06-01 — End: 2023-06-30

## 2023-06-01 NOTE — Progress Notes (Addendum)
.smr  Office: [phone]  /  Fax: [phone]  WEIGHT SUMMARY AND BIOMETRICS  Vitals Temp: 98 F (36.7 C) BP: 131/77 Pulse Rate: 67 SpO2: 97 %   Anthropometric Measurements Height: 5\' 8"  (1.727 m) Weight: 283 lb (128.4 kg) BMI (Calculated): 43.04 Weight at Last Visit: 283 lb Weight Lost Since Last Visit: 0 Weight Gained Since Last Visit: 0 Starting Weight: 288 lb Total Weight Loss (lbs): 5 lb (2.268 kg) Peak Weight: 300 lb   Body Composition  Body Fat %: 41 % Fat Mass (lbs): 116 lbs Muscle Mass (lbs): 158.6 lbs Total Body Water (lbs): 97.6 lbs Visceral Fat Rating : 13   Other Clinical Data Fasting: yes Labs: no Today's Visit #: 19 Starting Date: 07/02/22     HPI  Chief Complaint: OBESITY  Mia Parker is here to discuss her progress with her obesity treatment plan. She is on the the Category 3 Plan and keeping a food journal and adhering to recommended goals of 1500-1600 calories and 100 grams of  protein and states she is following her eating plan approximately 60 % of the time. She states she is exercising walking 45 minutes 5 times per week.  Discussed the use of AI scribe software for clinical note transcription with the patient, who gave verbal consent to proceed.  History of Present Illness  /   Interval History:  Since last office visit she has maintained her weight.  Bio impedence scale reviewed with the patient:  Up 8.4 lbs muscle mass! Down 9.2 lbs adipose mass !    The patient, with a history of obesity, prediabetes, and vitamin D deficiency, presents for a follow-up visit. She reports a consistent exercise routine of 45 minutes minimum, five days a week, and has noticed significant improvements in her muscle mass. However, she struggles with maintaining a regular eating schedule, often skipping breakfast and not eating until later in the day. She denies any issues with her current medications, metformin and vitamin D. She also mentions a previous  back fusion surgery two years ago and the subsequent physical discomfort she has experienced.  Pharmacotherapy: metformin 1000 mg twice daily for primary indication of prediabetes. No GI or other side effects with metformin. She has had a nice improvement over the past month following the increase in metformin, but she is frustrated by the slowness of her progress.   TREATMENT PLAN FOR OBESITY:  Recommended Dietary Goals  Mia Parker is currently in the action stage of change. As such, her goal is to continue weight management plan. She has agreed to the Category 3 Plan and keeping a food journal and adhering to recommended goals of 1500-1600 calories and 100 grams protein.  Behavioral Intervention  We discussed the following Behavioral Modification Strategies today: increasing lean protein intake, decreasing simple carbohydrates , increasing vegetables, increasing lower glycemic fruits, increasing fiber rich foods, avoiding skipping meals, increasing water intake, work on tracking and journaling calories using tracking application, emotional eating strategies and understanding the difference between hunger signals and cravings, continue to practice mindfulness when eating, and planning for success.  Additional resources provided today: NA  Recommended Physical Activity Goals  Mia Parker has been advised to work up to 150 minutes of moderate intensity aerobic activity a week and strengthening exercises 2-3 times per week for cardiovascular health, weight loss maintenance and preservation of muscle mass.   She has agreed to Continue current level of physical activity    Pharmacotherapy We discussed various medication options to help Mia Parker with her weight  loss efforts and we both agreed to continue metformin for primary indication of prediabetes.    Return in about 4 weeks (around 06/29/2023).Marland Kitchen She was informed of the importance of frequent follow up visits to maximize her success with intensive  lifestyle modifications for her multiple health conditions.  PHYSICAL EXAM:  Blood pressure 131/77, pulse 67, temperature 98 F (36.7 C), height 5\' 8"  (1.727 m), weight 283 lb (128.4 kg), SpO2 97%. Body mass index is 43.03 kg/m.  General: She is overweight, cooperative, alert, well developed, and in no acute distress. PSYCH: Has normal mood, affect and thought process.   Cardiovascular: HR 60's BP 131/77 Lungs: Normal breathing effort, no conversational dyspnea. Neuro: no focal deficits  DIAGNOSTIC DATA REVIEWED:  BMET    Component Value Date/Time   NA 137 01/27/2023 0821   K 4.2 01/27/2023 0821   CL 102 01/27/2023 0821   CO2 19 (L) 01/27/2023 0821   GLUCOSE 90 01/27/2023 0821   GLUCOSE 90 05/23/2016 1420   BUN 11 01/27/2023 0821   CREATININE 0.86 01/27/2023 0821   CREATININE 0.69 09/14/2015 0001   CALCIUM 9.6 01/27/2023 0821   GFRNONAA 84 02/22/2020 1607   GFRAA 97 02/22/2020 1607   Lab Results  Component Value Date   HGBA1C 5.7 (H) 01/27/2023   HGBA1C 6.0 (H) 10/02/2017   Lab Results  Component Value Date   INSULIN 11.5 01/27/2023   INSULIN 16.7 07/02/2022   Lab Results  Component Value Date   TSH 2.240 07/02/2022   CBC    Component Value Date/Time   WBC 9.2 07/02/2022 0847   WBC 10.7 (H) 05/24/2016 0529   RBC 5.35 (H) 07/02/2022 0847   RBC 4.19 05/24/2016 0529   HGB 15.3 07/02/2022 0847   HGB 13.5 11/08/2015 0000   HCT 45.2 07/02/2022 0847   HCT 41 11/08/2015 0000   PLT 335 07/02/2022 0847   MCV 85 07/02/2022 0847   MCH 28.6 07/02/2022 0847   MCH 27.9 05/24/2016 0529   MCHC 33.8 07/02/2022 0847   MCHC 34.0 05/24/2016 0529   RDW 13.1 07/02/2022 0847   Iron Studies No results found for: "IRON", "TIBC", "FERRITIN", "IRONPCTSAT" Lipid Panel     Component Value Date/Time   CHOL 235 (H) 07/02/2022 0847   TRIG 97 07/02/2022 0847   HDL 89 07/02/2022 0847   CHOLHDL 2.6 07/02/2022 0847   CHOLHDL 3.1 09/14/2015 0001   VLDL 20 09/14/2015 0001    LDLCALC 129 (H) 07/02/2022 0847   Hepatic Function Panel     Component Value Date/Time   PROT 7.2 01/27/2023 0821   ALBUMIN 4.3 01/27/2023 0821   AST 19 01/27/2023 0821   ALT 13 01/27/2023 0821   ALKPHOS 128 (H) 01/27/2023 0821   BILITOT 0.7 01/27/2023 0821      Component Value Date/Time   TSH 2.240 07/02/2022 0847   Nutritional Lab Results  Component Value Date   VD25OH 44.1 01/27/2023   VD25OH 19.0 (L) 07/02/2022    ASSOCIATED CONDITIONS ADDRESSED TODAY  ASSESSMENT AND PLAN  Problem List Items Addressed This Visit     Depression   Relevant Medications   buPROPion (WELLBUTRIN SR) 150 MG 12 hr tablet   Vitamin D deficiency   Relevant Medications   Vitamin D, Ergocalciferol, (DRISDOL) 1.25 MG (50000 UNIT) CAPS capsule   Pre-diabetes - Primary   Relevant Medications   metFORMIN (GLUCOPHAGE) 500 MG tablet   Obesity, Beginning BMI 43.79   Relevant Medications   metFORMIN (GLUCOPHAGE) 500 MG tablet  BMI 40.0-44.9, adult (HCC) Current BMI 42.8   Relevant Medications   metFORMIN (GLUCOPHAGE) 500 MG tablet  Obesity Patient has made significant progress in exercise routine and muscle mass gain, but struggles with meal timing, often skipping breakfast and eating later in the day. Discussed the importance of starting the day with a protein-based meal for satiety and weight loss maintenance. -Encourage incorporation of a protein-based breakfast, possibly a protein shake for convenience. -Continue current exercise routine.  Prediabetes Patient is on Metformin 1000mg  twice daily with no reported issues. -Continue/Refill Metformin 1000mg  twice daily. Continue working on nutrition plan to decrease simple carbohydrates, increase lean proteins and exercise to promote weight loss, improve glycemic control and prevent progression to Type 2 diabetes.    Vitamin D Deficiency Patient is on Vitamin D supplementation with no reported issues. Last Vitamin D level was  improved. -Continue Vitamin D supplementation. -Refill prescription.  Other Depression/Emotional eating behavior:  Patient is on Wellbutrin with no reported issues. -Continue Wellbutrin. -Refill prescription.  Follow-up appointment scheduled for Tuesday, May 31, 2023 at 815 AM.  ATTESTASTION STATEMENTS:  Reviewed by clinician on day of visit: allergies, medications, problem list, medical history, surgical history, family history, social history, and previous encounter notes.   I have personally spent 30 minutes total time today in preparation, patient care, nutritional counseling and documentation for this visit, including the following: review of clinical lab tests; review of medical tests/procedures/services.      Karalee Hauter, PA-C

## 2023-06-30 ENCOUNTER — Ambulatory Visit (INDEPENDENT_AMBULATORY_CARE_PROVIDER_SITE_OTHER): Payer: Medicaid Other | Admitting: Physician Assistant

## 2023-06-30 ENCOUNTER — Encounter (INDEPENDENT_AMBULATORY_CARE_PROVIDER_SITE_OTHER): Payer: Self-pay | Admitting: Physician Assistant

## 2023-06-30 VITALS — BP 111/72 | HR 67 | Temp 98.0°F | Ht 68.0 in | Wt 281.0 lb

## 2023-06-30 DIAGNOSIS — E559 Vitamin D deficiency, unspecified: Secondary | ICD-10-CM | POA: Diagnosis not present

## 2023-06-30 DIAGNOSIS — E669 Obesity, unspecified: Secondary | ICD-10-CM

## 2023-06-30 DIAGNOSIS — R7303 Prediabetes: Secondary | ICD-10-CM

## 2023-06-30 DIAGNOSIS — F5089 Other specified eating disorder: Secondary | ICD-10-CM | POA: Diagnosis not present

## 2023-06-30 DIAGNOSIS — E785 Hyperlipidemia, unspecified: Secondary | ICD-10-CM | POA: Diagnosis not present

## 2023-06-30 DIAGNOSIS — F3289 Other specified depressive episodes: Secondary | ICD-10-CM

## 2023-06-30 DIAGNOSIS — Z6841 Body Mass Index (BMI) 40.0 and over, adult: Secondary | ICD-10-CM

## 2023-06-30 DIAGNOSIS — R5383 Other fatigue: Secondary | ICD-10-CM | POA: Diagnosis not present

## 2023-06-30 DIAGNOSIS — E7800 Pure hypercholesterolemia, unspecified: Secondary | ICD-10-CM | POA: Insufficient documentation

## 2023-06-30 MED ORDER — VITAMIN D (ERGOCALCIFEROL) 1.25 MG (50000 UNIT) PO CAPS: 50000.0000 [IU] | ORAL_CAPSULE | ORAL | 0 refills | Status: DC

## 2023-06-30 MED ORDER — METFORMIN HCL 500 MG PO TABS: 1000.0000 mg | ORAL_TABLET | Freq: Two times a day (BID) | ORAL | 0 refills | Status: DC

## 2023-06-30 MED ORDER — BUPROPION HCL ER (SR) 150 MG PO TB12
150.0000 mg | ORAL_TABLET | Freq: Two times a day (BID) | ORAL | 0 refills | Status: DC
Start: 2023-06-30 — End: 2023-07-28

## 2023-06-30 NOTE — Progress Notes (Signed)
.smr  Office: [phone]  /  Fax: [phone]  WEIGHT SUMMARY AND BIOMETRICS  Vitals Temp: 98 F (36.7 C) BP: 111/72 Pulse Rate: 67 SpO2: 98 %   Anthropometric Measurements Height: 5\' 8"  (1.727 m) Weight: 281 lb (127.5 kg) BMI (Calculated): 42.74 Weight at Last Visit: 283 lb Weight Lost Since Last Visit: 2 lb Weight Gained Since Last Visit: 0 Starting Weight: 288 lb Total Weight Loss (lbs): 7 lb (3.175 kg) Peak Weight: 300 lb   Body Composition  Body Fat %: 48.2 % Fat Mass (lbs): 135.6 lbs Muscle Mass (lbs): 138.2 lbs Total Body Water (lbs): 94.8 lbs Visceral Fat Rating : 15   Other Clinical Data Fasting: yes Labs: no Today's Visit #: 20 Starting Date: 07/02/22     HPI  Chief Complaint: OBESITY  Mia Parker is here to discuss her progress with her obesity treatment plan. She is on the the Category 3 Plan and keeping a food journal and adhering to recommended goals of 1500-1600 calories and 100 grams of protein and states she is following her eating plan approximately 60 % of the time. She states she is exercising walking 45 minutes 5 times per week.   Interval History:  Since last office visit she down 2 lbs.  The patient, a 52 year old female with a history of prediabetes and vitamin D deficiency, presents for a follow-up visit regarding her obesity treatment plan. She reports a consistent exercise routine, walking for approximately 45 minutes at least five days a week. However, recent weight and muscle mass measurements show a discrepancy, which is not consistent with her level of physical activity. The patient is currently on Wellbutrin and metformin, with occasional stomach discomfort reported as a side effect of the latter. She is also taking vitamin D supplements every other week without any reported issues. The patient's adherence to the treatment plan is estimated to be around 60%. She has not eaten on the day of the visit.  Fasting labs obtained today.   She was informed we would discuss her lab results at her next visit unless there is a critical issue that needs to be addressed sooner. She agreed to keep her next visit at the agreed upon time to discuss these results.    Pharmacotherapy: metformin for primary indication of prediabetes. Some occasional GI upset, but not persistent/consistently- monitor.  Wellbutrin for emotional eating/cravings. No side effects.   TREATMENT PLAN FOR OBESITY: Obesity Despite regular exercise and adherence to the program, there is a concern about a decrease in muscle mass. The patient is currently on Wellbutrin and Metformin. -Continue current regimen of Wellbutrin and Metformin. -Add strengthening exercises to the routine, 2-3 sessions per week. -Consider use of ankle weights or a weighted vest during daily walks. -Recheck weight and muscle mass at next visit.  Recommended Dietary Goals  Mia Parker is currently in the action stage of change. As such, her goal is to continue weight management plan. She has agreed to the Category 3 Plan and keeping a food journal and adhering to recommended goals of 1500-1600 calories and 100+ grams of protein.  Behavioral Intervention  We discussed the following Behavioral Modification Strategies today: continue to work on maintaining a reduced calorie state, getting the recommended amount of protein, incorporating whole foods, making healthy choices, staying well hydrated and practicing mindfulness when eating..  Additional resources provided today: NA  Recommended Physical Activity Goals  Mia Parker has been advised to work up to 150 minutes of moderate intensity aerobic activity a week and strengthening  exercises 2-3 times per week for cardiovascular health, weight loss maintenance and preservation of muscle mass.   She has agreed to Increase physical activity in their day and reduce sedentary time (increase NEAT). and Start strengthening exercises with a goal of 2-3  sessions a week    Pharmacotherapy We discussed various medication options to help Mia Parker with her weight loss efforts and we both agreed to continue metformin 1000 mg twice daily and wellbutrin 150 mg twice daily.    Return in about 4 weeks (around 07/28/2023).Marland Kitchen She was informed of the importance of frequent follow up visits to maximize her success with intensive lifestyle modifications for her multiple health conditions.  PHYSICAL EXAM:  Blood pressure 111/72, pulse 67, temperature 98 F (36.7 C), height 5\' 8"  (1.727 m), weight 281 lb (127.5 kg), SpO2 98%. Body mass index is 42.73 kg/m.  General: She is overweight, cooperative, alert, well developed, and in no acute distress. PSYCH: Has normal mood, affect and thought process.   Cardiovascular: HR 60's BP 111/72 Lungs: Normal breathing effort, no conversational dyspnea. Neuro: no focal deficits  DIAGNOSTIC DATA REVIEWED:  BMET    Component Value Date/Time   NA 137 01/27/2023 0821   K 4.2 01/27/2023 0821   CL 102 01/27/2023 0821   CO2 19 (L) 01/27/2023 0821   GLUCOSE 90 01/27/2023 0821   GLUCOSE 90 05/23/2016 1420   BUN 11 01/27/2023 0821   CREATININE 0.86 01/27/2023 0821   CREATININE 0.69 09/14/2015 0001   CALCIUM 9.6 01/27/2023 0821   GFRNONAA 84 02/22/2020 1607   GFRAA 97 02/22/2020 1607   Lab Results  Component Value Date   HGBA1C 5.7 (H) 01/27/2023   HGBA1C 6.0 (H) 10/02/2017   Lab Results  Component Value Date   INSULIN 11.5 01/27/2023   INSULIN 16.7 07/02/2022   Lab Results  Component Value Date   TSH 2.240 07/02/2022   CBC    Component Value Date/Time   WBC 9.2 07/02/2022 0847   WBC 10.7 (H) 05/24/2016 0529   RBC 5.35 (H) 07/02/2022 0847   RBC 4.19 05/24/2016 0529   HGB 15.3 07/02/2022 0847   HGB 13.5 11/08/2015 0000   HCT 45.2 07/02/2022 0847   HCT 41 11/08/2015 0000   PLT 335 07/02/2022 0847   MCV 85 07/02/2022 0847   MCH 28.6 07/02/2022 0847   MCH 27.9 05/24/2016 0529   MCHC 33.8  07/02/2022 0847   MCHC 34.0 05/24/2016 0529   RDW 13.1 07/02/2022 0847   Iron Studies No results found for: "IRON", "TIBC", "FERRITIN", "IRONPCTSAT" Lipid Panel     Component Value Date/Time   CHOL 235 (H) 07/02/2022 0847   TRIG 97 07/02/2022 0847   HDL 89 07/02/2022 0847   CHOLHDL 2.6 07/02/2022 0847   CHOLHDL 3.1 09/14/2015 0001   VLDL 20 09/14/2015 0001   LDLCALC 129 (H) 07/02/2022 0847   Hepatic Function Panel     Component Value Date/Time   PROT 7.2 01/27/2023 0821   ALBUMIN 4.3 01/27/2023 0821   AST 19 01/27/2023 0821   ALT 13 01/27/2023 0821   ALKPHOS 128 (H) 01/27/2023 0821   BILITOT 0.7 01/27/2023 0821      Component Value Date/Time   TSH 2.240 07/02/2022 0847   Nutritional Lab Results  Component Value Date   VD25OH 44.1 01/27/2023   VD25OH 19.0 (L) 07/02/2022    ASSOCIATED CONDITIONS ADDRESSED TODAY  ASSESSMENT AND PLAN  Problem List Items Addressed This Visit     Other fatigue   Relevant  Orders   Vitamin B12   Depression   Relevant Medications   buPROPion (WELLBUTRIN SR) 150 MG 12 hr tablet   Vitamin D deficiency   Relevant Medications   Vitamin D, Ergocalciferol, (DRISDOL) 1.25 MG (50000 UNIT) CAPS capsule   Other Relevant Orders   VITAMIN D 25 Hydroxy (Vit-D Deficiency, Fractures)   Pre-diabetes - Primary   Relevant Medications   metFORMIN (GLUCOPHAGE) 500 MG tablet   Other Relevant Orders   CMP14+EGFR   Hemoglobin A1c   Insulin, random   Obesity, Beginning BMI 43.79   Relevant Medications   metFORMIN (GLUCOPHAGE) 500 MG tablet   BMI 40.0-44.9, adult (HCC) Current BMI 42.8   Relevant Medications   metFORMIN (GLUCOPHAGE) 500 MG tablet   Hypercholesterolemia   Relevant Orders   Lipid Panel With LDL/HDL Ratio    Prediabetes Last A1c was 5.7- no at goal. Insulin 11.5- not at goal.   Medication(s):  metformin 1000 mg twice daily.  Polyphagia:No Lab Results  Component Value Date   HGBA1C 5.7 (H) 01/27/2023   HGBA1C 5.8 (H)  07/02/2022   HGBA1C 5.7 (A) 02/22/2020   HGBA1C 6.0 (H) 10/02/2017   Lab Results  Component Value Date   INSULIN 11.5 01/27/2023   INSULIN 16.7 07/02/2022    Plan: Continue and refill  metformin 1000 mg twice daily.  Continue working on nutrition plan to decrease simple carbohydrates, increase lean proteins and exercise to promote weight loss, improve glycemic control and prevent progression to Type 2 diabetes.  -Check A1C, insulin level, CMETand fasting lipid panel today.   Vitamin D Deficiency Vitamin D is not at goal of 50.  Most recent vitamin D level was 44.1. She is on  prescription ergocalciferol 50,000 IU every 14 days. Lab Results  Component Value Date   VD25OH 44.1 01/27/2023   VD25OH 19.0 (L) 07/02/2022    Plan: Continue and refill  prescription ergocalciferol 50,000 IU every 14 days Low vitamin D levels can be associated with adiposity and may result in leptin resistance and weight gain. Also associated with fatigue. Currently on vitamin D supplementation without any adverse effects.  Recheck vitamin D levels today to optimize supplementation/avoid over supplementation.    Hyperlipidemia LDL is not at goal. Medication(s): None Cardiovascular risk factors: dyslipidemia, family history of premature cardiovascular disease, obesity (BMI >= 30 kg/m2), and sedentary lifestyle  Lab Results  Component Value Date   CHOL 235 (H) 07/02/2022   HDL 89 07/02/2022   LDLCALC 129 (H) 07/02/2022   TRIG 97 07/02/2022   CHOLHDL 2.6 07/02/2022   CHOLHDL 2.4 02/22/2020   CHOLHDL 2.8 10/02/2017   Lab Results  Component Value Date   ALT 13 01/27/2023   AST 19 01/27/2023   ALKPHOS 128 (H) 01/27/2023   BILITOT 0.7 01/27/2023   The 10-year ASCVD risk score (Arnett DK, et al., 2019) is: 1.2%   Values used to calculate the score:     Age: 40 years     Sex: Female     Is Non-Hispanic African American: No     Diabetic: Yes     Tobacco smoker: No     Systolic Blood Pressure:  111 mmHg     Is BP treated: No     HDL Cholesterol: 89 mg/dL     Total Cholesterol: 235 mg/dL  Plan: Continue to work on nutrition plan -decreasing simple carbohydrates, increasing lean proteins, decreasing saturated fats and cholesterol , avoiding trans fats and exercise as able to promote weight loss, improve lipids  and decrease cardiovascular risks. Recheck fasting lipid panel today.  Consideration for GLP-1 RA if LDL remains elevated to decrease CV risks.   Fatigue: Reports some intermittent fatigue.  Will recheck B 12 and vitamin D levels today.   Eating disorder/emotional eating Mia Parker has had issues with stress/emotional eating. Currently this is moderately controlled. Overall mood is stable. Medication(s): Bupropion SR 150 mg twice daily  Plan: Continue and refill Bupropion SR 150 mg twice daily Continue to work on emotional eating strategies.   General Health Maintenance -Continue regular exercise, consider adding strength training. -Check CMP, Vitamin B12, and electrolytes today.  ATTESTASTION STATEMENTS:  Reviewed by clinician on day of visit: allergies, medications, problem list, medical history, surgical history, family history, social history, and previous encounter notes.   I have personally spent 44 minutes total time today in preparation, patient care, nutritional counseling and documentation for this visit, including the following: review of clinical lab tests; review of medical tests/procedures/services.      Tacha Manni, PA-C

## 2023-07-01 ENCOUNTER — Encounter: Payer: Self-pay | Admitting: Obstetrics and Gynecology

## 2023-07-01 ENCOUNTER — Ambulatory Visit (INDEPENDENT_AMBULATORY_CARE_PROVIDER_SITE_OTHER): Payer: Medicaid Other | Admitting: Obstetrics and Gynecology

## 2023-07-01 ENCOUNTER — Other Ambulatory Visit (HOSPITAL_COMMUNITY)
Admission: RE | Admit: 2023-07-01 | Discharge: 2023-07-01 | Disposition: A | Discharge status: Home / Self Care | Payer: Medicaid Other | Source: Ambulatory Visit | Attending: Obstetrics and Gynecology | Admitting: Obstetrics and Gynecology

## 2023-07-01 VITALS — BP 126/72 | HR 62 | Ht 68.0 in | Wt 285.0 lb

## 2023-07-01 DIAGNOSIS — Z01419 Encounter for gynecological examination (general) (routine) without abnormal findings: Secondary | ICD-10-CM | POA: Insufficient documentation

## 2023-07-01 DIAGNOSIS — Z124 Encounter for screening for malignant neoplasm of cervix: Secondary | ICD-10-CM | POA: Insufficient documentation

## 2023-07-01 DIAGNOSIS — N951 Menopausal and female climacteric states: Secondary | ICD-10-CM | POA: Diagnosis not present

## 2023-07-01 DIAGNOSIS — Z1211 Encounter for screening for malignant neoplasm of colon: Secondary | ICD-10-CM

## 2023-07-01 NOTE — Assessment & Plan Note (Addendum)
Cervical cancer screening performed according to ASCCP guidelines. Encouraged annual mammogram screening Wants to continue cologuard screening due, ordered Labs and immunizations with her primary Encouraged safe sexual practices as indicated Encouraged healthy lifestyle practices with diet and exercise

## 2023-07-01 NOTE — Assessment & Plan Note (Signed)
Briefly discussed management of VMS, including use of HRT, SSRI, and veozah. Discussed necessity of risk screening depending on class of treatment. Also discussed CBT and meditation as lifestyle modifications that can improve hot flashes. Information for insighttimer, a mediation app provided. Patient wants to continue expectant management at this time.

## 2023-07-01 NOTE — Progress Notes (Signed)
52 y.o. [id number] female s/p BTL here for annual exam. Relationship x15 years. Stay at home mom. 10yo son and 7yo.  Patient's last menstrual period was 04/05/2023. Period Pattern: (!) Irregular (She had a period in Jan and then in July) Menstrual Flow:  (Patient says her bleeding ranges from light to heavy.)  Abnormal bleeding: Become more irregular over past 1-2 years Daily and nightly VMS, started over the past month. Pelvic discharge or pain: none Breast mass, nipple discharge or skin changes : none Birth control: BTL Last PAP: No results found for: "DIAGPAP", "HPVHIGH", "ADEQPAP" Last mammogram: never Last colonoscopy: never, cologuard July 2021, due Sexually active: yes  Exercising: walks 5d week, healthy weight loss group, started over last year Former smoker, quit 2014  GYN HISTORY: No significant history  OB History  Gravida Para Term Preterm AB Living  3 2 2  0 1 2  SAB IAB Ectopic Multiple Live Births  0 1 0 0 2    # Outcome Date GA Lbr Len/2nd Weight Sex Type Anes PTL Lv  3 Term 05/23/16 [redacted]w[redacted]d  7 lb 1.6 oz (3.22 kg) F CS-LTranv Spinal  LIV  2 Term 06/07/13 [redacted]w[redacted]d   M CS-LTranv EPI  LIV  1 IAB 1995            Past Medical History:  Diagnosis Date   ADD (attention deficit disorder)    AMA (advanced maternal age) multigravida 35+    Anxiety    Back pain    Depression    Diabetes mellitus without complication (HCC)    GDM with 2017 pregnancy   Hypertension    with 2017 pregnancy, no meds   Infertility, female    Joint pain    Obesity    Osteoporosis    Postpartum edema 06/10/2013   Sleep apnea    use CPAP nightly    Past Surgical History:  Procedure Laterality Date   CESAREAN SECTION N/A 06/07/2013   Procedure: Primary Cesarean Section Delivery Baby  Boy @ 2317, Apgars   ;  Surgeon: Lenoard Aden, MD;  Location: [facility];  Service: Obstetrics;  Laterality: N/A;   CESAREAN SECTION N/A 05/23/2016   Procedure: CESAREAN SECTION;  Surgeon: Olivia Mackie,  MD;  Location: [facility];  Service: Obstetrics;  Laterality: N/A;   KNEE CARTILAGE SURGERY Right    LEEP     TUBAL LIGATION Bilateral 05/23/2016   Procedure: BILATERAL TUBAL LIGATION;  Surgeon: Olivia Mackie, MD;  Location: [facility]IRTHING SUIT[facility];  Service: Obstetrics;  Laterality: Bilateral;   WISDOM TOOTH EXTRACTION      Current Outpatient Medications on File Prior to Visit  Medication Sig Dispense Refill   baclofen (LIORESAL) 10 MG tablet TAKE 0.5-1 TABLETS (5-10 MG TOTAL) BY MOUTH AT BEDTIME AS NEEDED FOR MUSCLE SPASMS. 90 tablet 1   buPROPion (WELLBUTRIN SR) 150 MG 12 hr tablet Take 1 tablet (150 mg total) by mouth 2 (two) times daily. 60 tablet 0   celecoxib (CELEBREX) 200 MG capsule Take 200 mg by mouth 2 (two) times daily.     metFORMIN (GLUCOPHAGE) 500 MG tablet Take 2 tablets (1,000 mg total) by mouth 2 (two) times daily. 120 tablet 0   Vitamin D, Ergocalciferol, (DRISDOL) 1.25 MG (50000 UNIT) CAPS capsule Take 1 capsule (50,000 Units total) by mouth every 14 (fourteen) days. 5 capsule 0   No current facility-administered medications on file prior to visit.    Social History   Socioeconomic History   Marital status: Single  Spouse name: Vonna Kotyk   Number of children: 2   Years of education: Not on file   Highest education level: Not on file  Occupational History   Not on file  Tobacco Use   Smoking status: Former    Current packs/day: 0.00    Average packs/day: 0.5 packs/day for 15.0 years (7.5 ttl pk-yrs)    Types: Cigarettes    Start date: 09/29/1997    Quit date: 09/29/2012    Years since quitting: 10.7   Smokeless tobacco: Never   Tobacco comments:    quit in august of 2013  Vaping Use   Vaping status: Never Used  Substance and Sexual Activity   Alcohol use: No    Alcohol/week: 0.0 standard drinks of alcohol   Drug use: No   Sexual activity: Yes    Birth control/protection: Surgical  Other Topics Concern   Not on file  Social History Narrative   Not on  file   Social Determinants of Health   Financial Resource Strain: Not on file  Food Insecurity: Not on file  Transportation Needs: Not on file  Physical Activity: Not on file  Stress: Not on file  Social Connections: Unknown (01/28/2022)   [facility] from [facility]   Social Network    Social Network: Not on file  Intimate Partner Violence: Unknown (12/20/2021)   Received from [employer], [facility]   HITS    Physically Hurt: Not on file    Insult or Talk Down To: Not on file    Threaten Physical Harm: Not on file    Scream or Curse: Not on file    Family History  Problem Relation Age of Onset   Hyperlipidemia Mother    Hypertension Mother    Hyperlipidemia Father    Diabetes Father    Hypertension Father    Heart attack Father    Heart disease Father    Breast cancer Neg Hx    Ovarian cancer Neg Hx     Allergies  Allergen Reactions   Hydrocodone-Acetaminophen Other (See Comments)    "feels like a panic attack "   Hydrocodone Anxiety and Palpitations      PE Today's Vitals   07/01/23 1341  BP: 126/72  Pulse: 62  SpO2: 100%  Weight: 285 lb (129.3 kg)  Height: 5\' 8"  (1.727 m)   Body mass index is 43.33 kg/m.  Physical Exam Vitals reviewed. Exam conducted with a chaperone present.  Constitutional:      General: She is not in acute distress.    Appearance: Normal appearance.  HENT:     Head: Normocephalic and atraumatic.     Nose: Nose normal.  Eyes:     Extraocular Movements: Extraocular movements intact.     Conjunctiva/sclera: Conjunctivae normal.  Neck:     Thyroid: No thyroid mass, thyromegaly or thyroid tenderness.  Pulmonary:     Effort: Pulmonary effort is normal.  Chest:     Chest wall: No mass or tenderness.  Breasts:    Right: Normal. No swelling, mass, nipple discharge, skin change or tenderness.     Left: Normal. No swelling, mass, nipple discharge, skin change or tenderness.  Abdominal:     General: There is no  distension.     Palpations: Abdomen is soft.     Tenderness: There is no abdominal tenderness.  Genitourinary:    General: Normal vulva.     Exam position: Lithotomy position.     Urethra: No prolapse.  Vagina: Normal. No vaginal discharge or bleeding.     Cervix: Normal. No lesion.     Uterus: Normal. Not enlarged and not tender.      Adnexa: Right adnexa normal and left adnexa normal.  Musculoskeletal:        General: Normal range of motion.     Cervical back: Normal range of motion.  Lymphadenopathy:     Upper Body:     Right upper body: No axillary adenopathy.     Left upper body: No axillary adenopathy.     Lower Body: No right inguinal adenopathy. No left inguinal adenopathy.  Skin:    General: Skin is warm and dry.  Neurological:     General: No focal deficit present.     Mental Status: She is alert.  Psychiatric:        Mood and Affect: Mood normal.        Behavior: Behavior normal.       Assessment and Plan:        Well woman exam with routine gynecological exam Assessment & Plan: Cervical cancer screening performed according to ASCCP guidelines. Encouraged annual mammogram screening Wants to continue cologuard screening due, ordered Labs and immunizations with her primary Encouraged safe sexual practices as indicated Encouraged healthy lifestyle practices with diet and exercise    Cervical cancer screening -     Cytology - PAP  Colon cancer screening -     Cologuard  Vasomotor symptoms due to menopause Assessment & Plan: Briefly discussed management of VMS, including use of HRT, SSRI, and veozah. Discussed necessity of risk screening depending on class of treatment. Also discussed CBT and meditation as lifestyle modifications that can improve hot flashes. Information for insighttimer, a mediation app provided. Patient wants to continue expectant management at this time.       Rosalyn Gess, MD

## 2023-07-01 NOTE — Patient Instructions (Addendum)
Consider using insighttimer, a mediation app for hot flashes.   Health Maintenance, Female Adopting a healthy lifestyle and getting preventive care are important in promoting health and wellness. Ask your health care provider about: The right schedule for you to have regular tests and exams. Things you can do on your own to prevent diseases and keep yourself healthy. What should I know about diet, weight, and exercise? Eat a healthy diet  Eat a diet that includes plenty of vegetables, fruits, low-fat dairy products, and lean protein. Do not eat a lot of foods that are high in solid fats, added sugars, or sodium. Maintain a healthy weight Body mass index (BMI) is used to identify weight problems. It estimates body fat based on height and weight. Your health care provider can help determine your BMI and help you achieve or maintain a healthy weight. Get regular exercise Get regular exercise. This is one of the most important things you can do for your health. Most adults should: Exercise for at least 150 minutes each week. The exercise should increase your heart rate and make you sweat (moderate-intensity exercise). Do strengthening exercises at least twice a week. This is in addition to the moderate-intensity exercise. Spend less time sitting. Even light physical activity can be beneficial. Watch cholesterol and blood lipids Have your blood tested for lipids and cholesterol at 50 years of age, then have this test every 5 years. Have your cholesterol levels checked more often if: Your lipid or cholesterol levels are high. You are older than 50 years of age. You are at high risk for heart disease. What should I know about cancer screening? Depending on your health history and family history, you may need to have cancer screening at various ages. This may include screening for: Breast cancer. Cervical cancer. Colorectal cancer. Skin cancer. Lung cancer. What should I know about heart  disease, diabetes, and high blood pressure? Blood pressure and heart disease High blood pressure causes heart disease and increases the risk of stroke. This is more likely to develop in people who have high blood pressure readings or are overweight. Have your blood pressure checked: Every 3-5 years if you are 62-66 years of age. Every year if you are 29 years old or older. Diabetes Have regular diabetes screenings. This checks your fasting blood sugar level. Have the screening done: Once every three years after age 40 if you are at a normal weight and have a low risk for diabetes. More often and at a younger age if you are overweight or have a high risk for diabetes. What should I know about preventing infection? Hepatitis B If you have a higher risk for hepatitis B, you should be screened for this virus. Talk with your health care provider to find out if you are at risk for hepatitis B infection. Hepatitis C Testing is recommended for: Everyone born from [birth year] through 1963. Anyone with known risk factors for hepatitis C. Sexually transmitted infections (STIs) Get screened for STIs, including gonorrhea and chlamydia, if: You are sexually active and are younger than 50 years of age. You are older than 50 years of age and your health care provider tells you that you are at risk for this type of infection. Your sexual activity has changed since you were last screened, and you are at increased risk for chlamydia or gonorrhea. Ask your health care provider if you are at risk. Ask your health care provider about whether you are at high risk for HIV. Your health  care provider may recommend a prescription medicine to help prevent HIV infection. If you choose to take medicine to prevent HIV, you should first get tested for HIV. You should then be tested every 3 months for as long as you are taking the medicine. Pregnancy If you are about to stop having your period (premenopausal) and you may become  pregnant, seek counseling before you get pregnant. Take 400 to 800 micrograms (mcg) of folic acid every day if you become pregnant. Ask for birth control (contraception) if you want to prevent pregnancy. Osteoporosis and menopause Osteoporosis is a disease in which the bones lose minerals and strength with aging. This can result in bone fractures. If you are 5 years old or older, or if you are at risk for osteoporosis and fractures, ask your health care provider if you should: Be screened for bone loss. Take a calcium or vitamin D supplement to lower your risk of fractures. Be given hormone replacement therapy (HRT) to treat symptoms of menopause. Follow these instructions at home: Alcohol use Do not drink alcohol if: Your health care provider tells you not to drink. You are pregnant, may be pregnant, or are planning to become pregnant. If you drink alcohol: Limit how much you have to: 0-1 drink a day. Know how much alcohol is in your drink. In the [place] one drink equals one 12 oz bottle of beer (355 mL), one 5 oz glass of wine (148 mL), or one 1 oz glass of hard liquor (44 mL). Lifestyle Do not use any products that contain nicotine or tobacco. These products include cigarettes, chewing tobacco, and vaping devices, such as e-cigarettes. If you need help quitting, ask your health care provider. Do not use street drugs. Do not share needles. Ask your health care provider for help if you need support or information about quitting drugs. General instructions Schedule regular health, dental, and eye exams. Stay current with your vaccines. Tell your health care provider if: You often feel depressed. You have ever been abused or do not feel safe at home. Summary Adopting a healthy lifestyle and getting preventive care are important in promoting health and wellness. Follow your health care provider's instructions about healthy diet, exercising, and getting tested or screened for  diseases. Follow your health care provider's instructions on monitoring your cholesterol and blood pressure. This information is not intended to replace advice given to you by your health care provider. Make sure you discuss any questions you have with your health care provider. Document Revised: 01/21/2021 Document Reviewed: 01/21/2021 Elsevier Patient Education  2024 [employer].

## 2023-07-02 LAB — LIPID PANEL WITH LDL/HDL RATIO
Cholesterol, Total: 216 mg/dL — ABNORMAL HIGH (ref 100–199)
HDL: 91 mg/dL (ref >39)
LDL Chol Calc (NIH): 113 mg/dL — ABNORMAL HIGH (ref 0–99)
LDL/HDL Ratio: 1.2 {ratio} (ref 0.0–3.2)
Triglycerides: 71 mg/dL (ref 0–149)
VLDL Cholesterol Cal: 12 mg/dL (ref 5–40)

## 2023-07-02 LAB — CYTOLOGY - PAP
Adequacy: ABSENT
Comment: NEGATIVE
Diagnosis: NEGATIVE
High risk HPV: NEGATIVE

## 2023-07-02 LAB — CMP14+EGFR
ALT: 12 [IU]/L (ref 0–32)
AST: 22 [IU]/L (ref 0–40)
Albumin: 4.3 g/dL (ref 3.9–4.9)
Alkaline Phosphatase: 111 [IU]/L (ref 44–121)
BUN/Creatinine Ratio: 16 (ref 9–23)
BUN: 13 mg/dL (ref 6–24)
Bilirubin Total: 0.5 mg/dL (ref 0.0–1.2)
CO2: 19 mmol/L — ABNORMAL LOW (ref 20–29)
Calcium: 9.2 mg/dL (ref 8.7–10.2)
Chloride: 103 mmol/L (ref 96–106)
Creatinine, Ser: 0.79 mg/dL (ref 0.57–1.00)
Globulin, Total: 3 g/dL (ref 1.5–4.5)
Glucose: 95 mg/dL (ref 70–99)
Potassium: 4.4 mmol/L (ref 3.5–5.2)
Sodium: 139 mmol/L (ref 134–144)
Total Protein: 7.3 g/dL (ref 6.0–8.5)
eGFR: 91 mL/min/{1.73_m2} (ref >59)

## 2023-07-02 LAB — HEMOGLOBIN A1C
Est. average glucose Bld gHb Est-mCnc: 123 mg/dL
Hgb A1c MFr Bld: 5.9 % — ABNORMAL HIGH (ref 4.8–5.6)

## 2023-07-02 LAB — INSULIN, RANDOM: INSULIN: 13.5 u[IU]/mL (ref 2.6–24.9)

## 2023-07-02 LAB — VITAMIN D 25 HYDROXY (VIT D DEFICIENCY, FRACTURES): Vit D, 25-Hydroxy: 28.3 ng/mL — ABNORMAL LOW (ref 30.0–100.0)

## 2023-07-02 LAB — VITAMIN B12: Vitamin B-12: 430 pg/mL (ref 232–1245)

## 2023-07-06 ENCOUNTER — Other Ambulatory Visit: Payer: Self-pay | Admitting: Obstetrics and Gynecology

## 2023-07-06 DIAGNOSIS — Z1231 Encounter for screening mammogram for malignant neoplasm of breast: Secondary | ICD-10-CM

## 2023-07-23 DIAGNOSIS — Z1211 Encounter for screening for malignant neoplasm of colon: Secondary | ICD-10-CM | POA: Diagnosis not present

## 2023-07-28 ENCOUNTER — Encounter (INDEPENDENT_AMBULATORY_CARE_PROVIDER_SITE_OTHER): Payer: Self-pay | Admitting: Physician Assistant

## 2023-07-28 ENCOUNTER — Telehealth (INDEPENDENT_AMBULATORY_CARE_PROVIDER_SITE_OTHER): Payer: Medicaid Other | Admitting: Physician Assistant

## 2023-07-28 DIAGNOSIS — F3289 Other specified depressive episodes: Secondary | ICD-10-CM | POA: Diagnosis not present

## 2023-07-28 DIAGNOSIS — E559 Vitamin D deficiency, unspecified: Secondary | ICD-10-CM | POA: Diagnosis not present

## 2023-07-28 DIAGNOSIS — Z6841 Body Mass Index (BMI) 40.0 and over, adult: Secondary | ICD-10-CM | POA: Diagnosis not present

## 2023-07-28 DIAGNOSIS — R7303 Prediabetes: Secondary | ICD-10-CM | POA: Diagnosis not present

## 2023-07-28 DIAGNOSIS — E669 Obesity, unspecified: Secondary | ICD-10-CM | POA: Diagnosis not present

## 2023-07-28 MED ORDER — METFORMIN HCL 500 MG PO TABS: 500.0000 mg | ORAL_TABLET | Freq: Two times a day (BID) | ORAL | 0 refills | Status: DC

## 2023-07-28 MED ORDER — BUPROPION HCL ER (SR) 150 MG PO TB12
150.0000 mg | ORAL_TABLET | Freq: Two times a day (BID) | ORAL | 0 refills | Status: DC
Start: 2023-07-28 — End: 2023-08-27

## 2023-07-28 MED ORDER — VITAMIN D (ERGOCALCIFEROL) 1.25 MG (50000 UNIT) PO CAPS: 50000.0000 [IU] | ORAL_CAPSULE | ORAL | 0 refills | Status: DC

## 2023-07-28 NOTE — Progress Notes (Signed)
TeleHealth Visit:  This visit was completed with telemedicine (audio/video) technology. Mia Parker has verbally consented to this TeleHealth visit. The patient is located at home, the provider is located at the office. The participants in this visit include the listed provider and patient. The visit was conducted today via MyChart video.  OBESITY Mia Parker is here to discuss her progress with her obesity treatment plan along with follow-up of her obesity related diagnoses.   Today's visit was # 21 Starting weight: 288 lbs Starting date: 07/02/2022 Weight at last in office visit: 281 lbs on 06/30/23 Total weight loss: 7 lbs at last in office visit on 06/30/23. Today's reported weight (Not reported): none reported  Nutrition Plan: the Category 3 plan - 50% adherence.  Current exercise: walking and weightlifting  Interim History:  She is staying home with her daughter who is sick today.  She reports taking medications as prescribed.  Not sure if Wellbutrin is that helpful, but may be somewhat helpful to decrease cravings.  Reports good water intake.  Some diarrhea with metformin when eating off plan at time.  Sleep mostly restorative.  Exercising regularly with both aerobic and strength training.  Denies polyphagia and Denies excessive cravings.  Eating all of the prescribed protein: yes - most of the time Skipping meals: No Drinking adequate water: Yes Drinking sugar sweetened beverages: No Hunger controlled: moderately controlled. Cravings controlled:  moderately controlled.   Pharmacotherapy: Mardean is on  metformin 1000 mg BID Adverse side effects: Diarrhea Hunger is moderately controlled.  Cravings are moderately controlled.  Assessment/Plan:  1. Prediabetes Last A1c was 5.9- slightly worse.  Labs were reviewed today and discussed with the patient.   Electrolytes with slightly low CO2 at 19 likely due to metformin use.  Medication(s):  metformin 1000 mg BID   Reports some GI upset with metformin.  Polyphagia:No Lab Results  Component Value Date   HGBA1C 5.9 (H) 06/30/2023   HGBA1C 5.7 (H) 01/27/2023   HGBA1C 5.8 (H) 07/02/2022   HGBA1C 5.7 (A) 02/22/2020   HGBA1C 6.0 (H) 10/02/2017   Lab Results  Component Value Date   INSULIN 13.5 06/30/2023   INSULIN 11.5 01/27/2023   INSULIN 16.7 07/02/2022    Plan: With slightly electrolyte imbalance and increased GI upset with increased metformin, will decrease dose to metformin 500 mg BID and monitor response.  Continue and decrease dose Metformin 500 mg twice daily with meals Continue working on nutrition plan to decrease simple carbohydrates, increase lean proteins and exercise to promote weight loss, improve glycemic control and prevent progression to Type 2 diabetes.    2. Vitamin D Deficiency Vitamin D is not at goal of 50.  Most recent vitamin D level was 28.3. She is on  prescription ergocalciferol 50,000 IU every 14 days. Lab Results  Component Value Date   VD25OH 28.3 (L) 06/30/2023   VD25OH 44.1 01/27/2023   VD25OH 19.0 (L) 07/02/2022    Plan: Continue and increase dose  prescription ergocalciferol 50,000 IU weekly Low vitamin D levels can be associated with adiposity and may result in leptin resistance and weight gain. Also associated with fatigue. Currently on vitamin D supplementation without any adverse effects.  Recheck vitamin D levels several times yearly to optimize supplementation/avoid over supplementation.   3. Eating disorder/emotional eating Mckenzee has had issues with stress/emotional eating. Currently this is moderately controlled. Overall mood is stable. Medication(s): Bupropion SR 200 mg twice daily No side effects with bupropion..   Plan: Continue and refill Bupropion SR 200 mg  twice daily Continue to work on emotional eating strategies.   Morbid Obesity: Current BMI 43  Pharmacotherapy Plan Continue and refill  Metformin 500 mg twice daily with  meals  [name] is currently in the action stage of change. As such, her goal is to continue with weight loss efforts.  She has agreed to the Category 3 plan.  Exercise goals: All adults should avoid inactivity. Some physical activity is better than none, and adults who participate in any amount of physical activity gain some health benefits. For substantial health benefits, adults should do at least 150 minutes (2 hours and 30 minutes) a week of moderate-intensity, or 75 minutes (1 hour and 15 minutes) a week of vigorous-intensity aerobic physical activity, or an equivalent combination of moderate- and vigorous-intensity aerobic activity. Aerobic activity should be performed in episodes of at least 10 minutes, and preferably, it should be spread throughout the week.  Behavioral modification strategies: increasing lean protein intake, decreasing simple carbohydrates , increase water intake, planning for success, increasing vegetables, increasing fiber rich foods, emotional eating strategies, holiday eating strategies , and mindful eating.  [name] has agreed to follow-up with our clinic in 4 weeks.  No orders of the defined types were placed in this encounter.   Medications Discontinued During This Encounter  Medication Reason   buPROPion (WELLBUTRIN SR) 150 MG 12 hr tablet Reorder   metFORMIN (GLUCOPHAGE) 500 MG tablet Reorder   Vitamin D, Ergocalciferol, (DRISDOL) 1.25 MG (50000 UNIT) CAPS capsule Reorder     Meds ordered this encounter  Medications   buPROPion (WELLBUTRIN SR) 150 MG 12 hr tablet    Sig: Take 1 tablet (150 mg total) by mouth 2 (two) times daily.    Dispense:  60 tablet    Refill:  0   metFORMIN (GLUCOPHAGE) 500 MG tablet    Sig: Take 1 tablet (500 mg total) by mouth 2 (two) times daily with a meal.    Dispense:  60 tablet    Refill:  0   Vitamin D, Ergocalciferol, (DRISDOL) 1.25 MG (50000 UNIT) CAPS capsule    Sig: Take 1 capsule (50,000 Units total) by mouth every 7  (seven) days.    Dispense:  5 capsule    Refill:  0      Objective:   VITALS: Per patient if applicable, see vitals. GENERAL: Alert and in no acute distress. CARDIOPULMONARY: No increased WOB. Speaking in clear sentences.  PSYCH: Pleasant and cooperative. Speech normal rate and rhythm. Affect is appropriate. Insight and judgement are appropriate. Attention is focused, linear, and appropriate.  NEURO: Oriented as arrived to appointment on time with no prompting.   Attestation Statements:   Reviewed by clinician on day of visit: allergies, medications, problem list, medical history, surgical history, family history, social history, and previous encounter notes.   Time spent on visit including the items listed below was 33 minutes.  -preparing to see the patient (e.g., review of tests, history, previous notes) -obtaining and/or reviewing separately obtained history -counseling and educating the patient/family/caregiver -documenting clinical information in the electronic or other health record -ordering medications, tests, or procedures -independently interpreting results and communicating results to the patient/ family/caregiver -referring and communicating with other health care professionals  -care coordination   Kristapher [place],PA-C

## 2023-07-29 LAB — COLOGUARD: COLOGUARD: NEGATIVE

## 2023-08-04 ENCOUNTER — Ambulatory Visit
Admission: RE | Admit: 2023-08-04 | Discharge: 2023-08-04 | Disposition: A | Discharge status: Home / Self Care | Payer: Medicaid Other | Source: Ambulatory Visit | Attending: Obstetrics and Gynecology | Admitting: Obstetrics and Gynecology

## 2023-08-04 DIAGNOSIS — Z1231 Encounter for screening mammogram for malignant neoplasm of breast: Secondary | ICD-10-CM | POA: Diagnosis not present

## 2023-08-07 ENCOUNTER — Other Ambulatory Visit: Payer: Self-pay | Admitting: Obstetrics and Gynecology

## 2023-08-07 DIAGNOSIS — R928 Other abnormal and inconclusive findings on diagnostic imaging of breast: Secondary | ICD-10-CM

## 2023-08-27 ENCOUNTER — Ambulatory Visit (INDEPENDENT_AMBULATORY_CARE_PROVIDER_SITE_OTHER): Payer: Medicaid Other | Admitting: Physician Assistant

## 2023-08-27 ENCOUNTER — Encounter (INDEPENDENT_AMBULATORY_CARE_PROVIDER_SITE_OTHER): Payer: Self-pay | Admitting: Physician Assistant

## 2023-08-27 VITALS — BP 103/68 | HR 69 | Temp 97.5°F | Ht 68.0 in | Wt 287.0 lb

## 2023-08-27 DIAGNOSIS — Z6841 Body Mass Index (BMI) 40.0 and over, adult: Secondary | ICD-10-CM | POA: Diagnosis not present

## 2023-08-27 DIAGNOSIS — F5089 Other specified eating disorder: Secondary | ICD-10-CM

## 2023-08-27 DIAGNOSIS — E559 Vitamin D deficiency, unspecified: Secondary | ICD-10-CM | POA: Diagnosis not present

## 2023-08-27 DIAGNOSIS — R7303 Prediabetes: Secondary | ICD-10-CM

## 2023-08-27 DIAGNOSIS — E669 Obesity, unspecified: Secondary | ICD-10-CM

## 2023-08-27 DIAGNOSIS — F3289 Other specified depressive episodes: Secondary | ICD-10-CM

## 2023-08-27 MED ORDER — VITAMIN D (ERGOCALCIFEROL) 1.25 MG (50000 UNIT) PO CAPS: 50000.0000 [IU] | ORAL_CAPSULE | ORAL | 0 refills | Status: DC

## 2023-08-27 MED ORDER — BUPROPION HCL ER (SR) 150 MG PO TB12: 150.0000 mg | ORAL_TABLET | Freq: Two times a day (BID) | ORAL | 0 refills | Status: DC

## 2023-08-27 MED ORDER — METFORMIN HCL 500 MG PO TABS: 500.0000 mg | ORAL_TABLET | Freq: Two times a day (BID) | ORAL | 0 refills | Status: DC

## 2023-08-27 NOTE — Progress Notes (Signed)
SUBJECTIVE:  Chief Complaint: Obesity  Interim History: She is up 6 lbs since her last visit in the office.   Mia Parker, a 52 year old female with a history of obesity, prediabetes, hyperlipidemia, and vitamin D deficiency, presents for a follow-up visit regarding her obesity treatment plan.  She has been on metformin, recently decreased to 500 mg twice a day due to mild indicators of acidosis, and reports tolerating this well. She is also on bupropion 150 mg twice a day and vitamin D 50,000 units once weekly.  The patient reports struggling with weight regain over the last couple of months. She attributes this to the cold weather, which she feels has led to a decrease in her activity level and an increase in her desire to eat. She reports trying to use her son's VR to work out as an alternative to outdoor activities during the cold weather.  She has had a slight  increase in muscle mass, which she attributes to the VR workouts. She reports a preference for sweets and has been trying to limit her intake. Feels bupropion may be helpful with cravings overall, but still experiencing significant cravings especially during holidays.   The patient also reports good sleep and no issues with bupropion. She has been taking vitamin D once a week and reports that she has been adhering to this regimen. She plans to continue this regimen and to try to get back on track with her diet and exercise plan.  Mia Parker is here to discuss her progress with her obesity treatment plan. She is on the Category 3 Plan and keeping a food journal and adhering to recommended goals of 1500-1600 calories and 100 grams of protein and states she is following her eating plan approximately 40 % of the time. She states she is exercising walking /VR program/work out 20-30 minutes 4 times per week.   OBJECTIVE: Visit Diagnoses: Problem List Items Addressed This Visit     Depression   Relevant Medications   buPROPion  (WELLBUTRIN SR) 150 MG 12 hr tablet   Vitamin D deficiency   Relevant Medications   Vitamin D, Ergocalciferol, (DRISDOL) 1.25 MG (50000 UNIT) CAPS capsule   Pre-diabetes - Primary   Relevant Medications   metFORMIN (GLUCOPHAGE) 500 MG tablet   Obesity, Beginning BMI 43.79   Relevant Medications   metFORMIN (GLUCOPHAGE) 500 MG tablet  Obesity 52 year old with obesity experiencing weight regain. Using VR workouts, increased muscle mass. Struggles with diet, especially during holidays. Discussed JYNWGN for weight management, requiring commitment. Risks include gastrointestinal issues and long-term adherence. Will discuss further at start of new year.  - Encourage continued VR workouts - Recommend free classes to improve nutrition compliance and exercise consistently using VR or consider AHOY classes - Discuss considering Wegovy for hyperlipidemia as well as weight management in the new year - Reassess weight management plan in January  Hyperlipidemia The 10-year ASCVD risk score (Arnett DK, et al., 2019) is: 0.9%   Values used to calculate the score:     Age: 49 years     Sex: Female     Is Non-Hispanic African American: No     Diabetic: Yes     Tobacco smoker: No     Systolic Blood Pressure: 103 mmHg     Is BP treated: No     HDL Cholesterol: 91 mg/dL     Total Cholesterol: 216 mg/dL She is not on statin therapy as not indicated at this time.  Discussed Wegovy for hyperlipidemia and  weight management. Consider checking lipoprotein A levels to assess cardiovascular risk further at next lab draw. Explained lipoprotein A as a genetic marker for cardiovascular disease risk, with limited current treatments under study. - Consider [phone] for hyperlipidemia in addition to weight management in the new year - Consider Checking lipoprotein A levels in January  Prediabetes Prediabetes managed with metformin 500 mg twice daily. Dose recently decreased due to mild acidosis indicators,  well-tolerated. Lab Results  Component Value Date   HGBA1C 5.9 (H) 06/30/2023  Insulin level 13.5- not at goal.  - Continue/refill metformin 500 mg twice daily Continue working on nutrition plan to decrease simple carbohydrates, increase lean proteins and exercise to promote weight loss, improve glycemic control and prevent progression to Type 2 diabetes.  - Reassess in January  Vitamin D Deficiency Vitamin D deficiency managed with vitamin D Ergocalciferol 50,000 units weekly. Recent level 28, continue supplementation. - Continue/refill Ergocalciferol 50,000 units weekly Low vitamin D levels can be associated with adiposity and may result in leptin resistance and weight gain. Also associated with fatigue. Currently on vitamin D supplementation without any adverse effects.  - Recheck vitamin D levels as needed   Eating disorder/emotional eating Mia Parker has had issues with stress/emotional eating. Currently this is moderately controlled. Overall mood is stable. Medication(s): Bupropion SR 200 mg twice daily  Plan: Continue and refill Bupropion SR 200 mg twice daily Continue to work on emotional eating strategies.   General Health Maintenance Discussed diet modifications and exercise. Advised healthier alternatives for sweets and hot chocolate. Recommended Fairlife chocolate milk with 13g protein and 140 calories per 8 oz. - Encourage Fairlife chocolate milk as a healthier alternative   Follow-up - Follow-up appointment on September 28, 2023 at 10:00 AM.  Vitals Temp: (!) 97.5 F (36.4 C) BP: 103/68 Pulse Rate: 69 SpO2: 98 %   Anthropometric Measurements Height: 5\' 8"  (1.727 m) Weight: 287 lb (130.2 kg) BMI (Calculated): 43.65 Weight at Last Visit: 281 lb Weight Lost Since Last Visit: 0 Weight Gained Since Last Visit: 6 lb Starting Weight: 288 lb Total Weight Loss (lbs): 1 lb (0.454 kg) Peak Weight: 300 lb   Body Composition  Body Fat %: 48.6 % Fat Mass (lbs): 139.6  lbs Muscle Mass (lbs): 140.2 lbs Total Body Water (lbs): 97 lbs Visceral Fat Rating : 16   Other Clinical Data Fasting: no Labs: no Today's Visit #: 21 Starting Date: 07/02/22     ASSESSMENT AND PLAN:  Diet: Mia Parker is currently in the action stage of change. As such, her goal is to continue with weight loss efforts. She has agreed to Category 3 Plan and keeping a food journal and adhering to recommended goals of 1500-1600 calories and 100 grams of protein.  Exercise: Mia Parker has been instructed to work up to a goal of 150 minutes of combined cardio and strengthening exercise per week for weight loss and overall health benefits.and look into AHOY programs with Mia Parker and Mia Parker.    Behavior Modification:  We discussed the following Behavioral Modification Strategies today: increasing lean protein intake, decreasing simple carbohydrates, increasing vegetables, increase H2O intake, increase high fiber foods, meal planning and cooking strategies, emotional eating strategies , holiday eating strategies, planning for success, and keep a strict food journal. We discussed various medication options to help Mia Parker with her weight loss efforts and we both agreed to continue metformin for prediabetes and Wellbutrin for emotional eating/cravings.  Return in about 4 weeks (around 09/24/2023).Marland Kitchen She was informed of the importance  of frequent follow up visits to maximize her success with intensive lifestyle modifications for her multiple health conditions.  Attestation Statements:   Reviewed by clinician on day of visit: allergies, medications, problem list, medical history, surgical history, family history, social history, and previous encounter notes.   Time spent on visit including pre-visit chart review and post-visit care and charting was 36 minutes.    Sarinah Doetsch, PA-C

## 2023-08-31 ENCOUNTER — Ambulatory Visit
Admission: RE | Admit: 2023-08-31 | Discharge: 2023-08-31 | Disposition: A | Discharge status: Home / Self Care | Payer: Medicaid Other | Source: Ambulatory Visit | Attending: Obstetrics and Gynecology | Admitting: Obstetrics and Gynecology

## 2023-08-31 DIAGNOSIS — R928 Other abnormal and inconclusive findings on diagnostic imaging of breast: Secondary | ICD-10-CM

## 2023-08-31 DIAGNOSIS — N6311 Unspecified lump in the right breast, upper outer quadrant: Secondary | ICD-10-CM | POA: Diagnosis not present

## 2023-09-27 NOTE — Progress Notes (Deleted)
   SUBJECTIVE:  Chief Complaint: Obesity  Interim History: ***  Mia Parker is here to discuss her progress with her obesity treatment plan. She is on the {HWW Weight Loss Plan:210964005} and states she {CHL AMB IS/IS NOT:210130109} following her eating plan approximately *** % of the time. She states she {CHL AMB IS/IS NOT:210130109} exercising *** minutes *** times per week.   OBJECTIVE: Visit Diagnoses: Problem List Items Addressed This Visit     Depression   Vitamin D  deficiency   Pre-diabetes - Primary   Obesity, Beginning BMI 43.79   Hypercholesterolemia    No data recorded No data recorded No data recorded No data recorded   ASSESSMENT AND PLAN:  Diet: Mia Parker {CHL AMB IS/IS NOT:210130109} currently in the action stage of change. As such, her goal is to {HWW Weight Loss Efforts:210964006}. She {HAS HAS WNU:81165} agreed to {HWW Weight Loss Plan:210964005}.  Exercise: Mia Parker has been instructed {HWW Exercise:210964007} for weight loss and overall health benefits.   Behavior Modification:  We discussed the following Behavioral Modification Strategies today: {HWW Behavior Modification:210964008}. We discussed various medication options to help Mia Parker with her weight loss efforts and we both agreed to ***.  No follow-ups on file.Mia Parker She was informed of the importance of frequent follow up visits to maximize her success with intensive lifestyle modifications for her multiple health conditions.  Attestation Statements:   Reviewed by clinician on day of visit: allergies, medications, problem list, medical history, surgical history, family history, social history, and previous encounter notes.   Time spent on visit including pre-visit chart review and post-visit care and charting was *** minutes.    Mia Kingston, PA-C

## 2023-09-28 ENCOUNTER — Ambulatory Visit (INDEPENDENT_AMBULATORY_CARE_PROVIDER_SITE_OTHER): Payer: Medicaid Other | Admitting: Physician Assistant

## 2023-10-07 ENCOUNTER — Ambulatory Visit (INDEPENDENT_AMBULATORY_CARE_PROVIDER_SITE_OTHER): Payer: Medicaid Other | Admitting: Physician Assistant

## 2023-10-07 ENCOUNTER — Encounter (INDEPENDENT_AMBULATORY_CARE_PROVIDER_SITE_OTHER): Payer: Self-pay | Admitting: Physician Assistant

## 2023-10-07 VITALS — BP 125/76 | HR 72 | Temp 97.7°F | Ht 68.0 in | Wt 286.0 lb

## 2023-10-07 DIAGNOSIS — R7303 Prediabetes: Secondary | ICD-10-CM | POA: Diagnosis not present

## 2023-10-07 DIAGNOSIS — E7800 Pure hypercholesterolemia, unspecified: Secondary | ICD-10-CM | POA: Diagnosis not present

## 2023-10-07 DIAGNOSIS — Z6841 Body Mass Index (BMI) 40.0 and over, adult: Secondary | ICD-10-CM | POA: Diagnosis not present

## 2023-10-07 DIAGNOSIS — F5089 Other specified eating disorder: Secondary | ICD-10-CM | POA: Diagnosis not present

## 2023-10-07 DIAGNOSIS — F3289 Other specified depressive episodes: Secondary | ICD-10-CM

## 2023-10-07 DIAGNOSIS — E669 Obesity, unspecified: Secondary | ICD-10-CM

## 2023-10-07 DIAGNOSIS — E559 Vitamin D deficiency, unspecified: Secondary | ICD-10-CM

## 2023-10-07 MED ORDER — VITAMIN D (ERGOCALCIFEROL) 1.25 MG (50000 UNIT) PO CAPS: 50000.0000 [IU] | ORAL_CAPSULE | ORAL | 0 refills | Status: DC

## 2023-10-07 MED ORDER — WEGOVY 0.25 MG/0.5ML ~~LOC~~ SOAJ: 0.2500 mg | SUBCUTANEOUS | 0 refills | Status: DC

## 2023-10-07 MED ORDER — BUPROPION HCL ER (SR) 150 MG PO TB12: 150.0000 mg | ORAL_TABLET | Freq: Two times a day (BID) | ORAL | 0 refills | Status: DC

## 2023-10-07 MED ORDER — METFORMIN HCL 500 MG PO TABS: 500.0000 mg | ORAL_TABLET | Freq: Two times a day (BID) | ORAL | 0 refills | Status: DC

## 2023-10-07 NOTE — Progress Notes (Addendum)
SUBJECTIVE:  Chief Complaint: Obesity  Interim History: She is down 1 lb since her last visit.   Mia Parker is a 53 year old female with a history of prediabetes, hyperlipidemia, vitamin D deficiency, and emotional eating behavior, presents for a follow-up visit regarding her obesity treatment plan. Despite adherence to prescribed medications including metformin 500mg  BID, ergocalciferol 50,000 units once weekly, and bupropion SR 150mg  BID, the patient has had minimal weight loss over the past year.  She describes a struggle with maintaining a regular exercise routine due to the cold weather and disruptions in her daily schedule. However, she notes a successful period of increased physical activity during a recent vacation, achieving 10,000 steps daily.  The patient has been experiencing symptoms suggestive of menopause, including night sweats and mood fluctuations. She reports no issues with her current medications, including metformin and bupropion, and sleep is generally satisfactory despite occasional night sweats.  The patient expresses interest in trying [facility], an injectable medication for weight loss and hypercholesterolemia management, and is willing to manage potential side effects such as nausea, fatigue, and constipation. She denies any personal or family history of medullary thyroid cancer, multiple endocrine neoplasia syndrome type 2, or pancreatitis. She confirms the presence of her gallbladder and understands the potential risk of gallbladder issues with the new medication. We have reviewed the risks and benefits of using a GLP-1. The patient denies a personal or family history of medullary thyroid cancer or MENII. The patient denies a history of pancreatitis. The potential risks and benefits of this GLP-1 were reviewed with the patient, and alternative treatment options were discussed. All questions were answered, and the patient wishes to move forward with this  medication.   The patient is motivated to improve her health and is open to incorporating more physical activity into her routine, such as home-based walking workouts.  Mia Parker is here to discuss her progress with her obesity treatment plan. She is on the Category 3 Plan and states she is following her eating plan approximately 30 % of the time. She states she is not exercising 0 minutes 0 times per week.   OBJECTIVE: Visit Diagnoses: Problem List Items Addressed This Visit     Depression   Relevant Medications   Semaglutide-Weight Management (WEGOVY) 0.25 MG/0.5ML SOAJ   buPROPion (WELLBUTRIN SR) 150 MG 12 hr tablet   Vitamin D deficiency   Relevant Medications   Vitamin D, Ergocalciferol, (DRISDOL) 1.25 MG (50000 UNIT) CAPS capsule   Pre-diabetes - Primary   Relevant Medications   Semaglutide-Weight Management (WEGOVY) 0.25 MG/0.5ML SOAJ   metFORMIN (GLUCOPHAGE) 500 MG tablet   Obesity, Beginning BMI 43.79   Relevant Medications   Semaglutide-Weight Management (WEGOVY) 0.25 MG/0.5ML SOAJ   metFORMIN (GLUCOPHAGE) 500 MG tablet   BMI 40.0-44.9, adult (HCC) Current BMI 42.8   Relevant Medications   Semaglutide-Weight Management (WEGOVY) 0.25 MG/0.5ML SOAJ   metFORMIN (GLUCOPHAGE) 500 MG tablet   Hypercholesterolemia   Relevant Medications   Semaglutide-Weight Management (WEGOVY) 0.25 MG/0.5ML SOAJ  Obesity 53 year old with obesity, prediabetes, hyperlipidemia, and vitamin D deficiency. Minimal weight loss (2.2 lbs) despite medications and lifestyle changes. Discussed Wegovy (semaglutide) for weight loss and cholesterol improvement. Explained mechanism, side effects (nausea, vomiting, fatigue, constipation, mood changes, vision changes), and gallbladder monitoring. Emphasized protein intake, hydration, and physical activity. Discussed Wegovy's potential benefits for menopausal symptoms and mood changes. Informed about injection process and weekly administration. - Submit prior  authorization for [facility] at 0.25 mg once weekly for four  weeks - Monitor for side effects - Encourage protein intake and hydration - Recommend walking videos for exercise - Follow up on November 04, 2023, at 9 AM  Hyperlipidemia Lab Results  Component Value Date   LDLCALC 113 (H) 06/30/2023     Potential benefits of Wegovy in lowering LDL and total cholesterol.  Start Wegovy 0.25 mg weekly for primary indication of hyperlipidemia as well as to promote weight loss.  - Monitor lipid levels after starting Wegovy Continue to work on nutrition plan -decreasing simple carbohydrates, increasing lean proteins, decreasing saturated fats and cholesterol , avoiding trans fats and exercise as able to promote weight loss, improve lipids and decrease cardiovascular risks.   Prediabetes Lab Results  Component Value Date   HGBA1C 5.9 (H) 06/30/2023   HGBA1C 5.7 (H) 01/27/2023   HGBA1C 5.8 (H) 07/02/2022   Lab Results  Component Value Date   LDLCALC 113 (H) 06/30/2023   CREATININE 0.79 06/30/2023   INSULIN  Date Value Ref Range Status  06/30/2023 13.5 2.6 - 24.9 uIU/mL Final   Potential benefits of Wegovy in improving glycemic control and insulin levels. Currently on metformin 500 mg BID. - Continue/refill metformin 500 mg BID - Monitor glycemic control and insulin levels after starting ZOXWRU Continue working on nutrition plan to decrease simple carbohydrates, increase lean proteins and exercise to promote weight loss, improve glycemic control and prevent progression to Type 2 diabetes.   Emotional Eating Behavior Potential mood changes with Wegovy and will monitor following initiation of medication. Currently on bupropion SR 150 mg BID. No side effects with bupropion.  - Continue/refill bupropion SR 150 mg BID Continue to work on emotional eating behaviors.   Vitamin D Deficiency Currently on ergocalciferol 50,000 units once weekly. No N/V or muscle weakness on  Ergocalciferol.  Last vitamin D level 28.3- not at goal  - Continue/refill ergocalciferol 50,000 units once weekly Low vitamin D levels can be associated with adiposity and may result in leptin resistance and weight gain. Also associated with fatigue. Currently on vitamin D supplementation without any adverse effects.   General Health Maintenance Encouraged regular physical activity, protein intake, and hydration. - Encourage regular physical activity - Recommend walking videos for exercise - Emphasize protein intake and hydration  Follow-up - Follow up on November 04, 2023, at 9 AM.  Vitals Temp: 97.7 F (36.5 C) BP: 125/76 Pulse Rate: 72 SpO2: 99 %   Anthropometric Measurements Height: 5\' 8"  (1.727 m) Weight: 286 lb (129.7 kg) BMI (Calculated): 43.5 Weight at Last Visit: 287lb Weight Lost Since Last Visit: 1lb Weight Gained Since Last Visit: 0 Starting Weight: 288lb Total Weight Loss (lbs): 2 lb (0.907 kg) Peak Weight: 300lb   Body Composition  Body Fat %: 48 % Fat Mass (lbs): 137.4 lbs Muscle Mass (lbs): 141.4 lbs Total Body Water (lbs): 93 lbs Visceral Fat Rating : 15   Other Clinical Data Fasting: no Labs: no Today's Visit #: 22 Starting Date: 07/02/22     ASSESSMENT AND PLAN:  Diet: Burna is currently in the action stage of change. As such, her goal is to continue with weight loss efforts. She has agreed to Category 3 Plan.  Exercise: Marlem has been instructed to work up to a goal of 150 minutes of combined cardio and strengthening exercise per week for weight loss and overall health benefits.   Behavior Modification:  We discussed the following Behavioral Modification Strategies today: increasing lean protein intake, decreasing simple carbohydrates, increasing vegetables, increase H2O intake, increase high  fiber foods, no skipping meals, meal planning and cooking strategies, emotional eating strategies , and planning for success. We discussed  various medication options to help Mahria with her weight loss efforts and we both agreed to start [facility] for primary indication of hyperlipidemia, but also to promote weight loss and continue to work on nutritional and behavioral strategies to promote weight loss.  .  Return in about 4 weeks (around 11/04/2023).Marland Kitchen She was informed of the importance of frequent follow up visits to maximize her success with intensive lifestyle modifications for her multiple health conditions.  Attestation Statements:   Reviewed by clinician on day of visit: allergies, medications, problem list, medical history, surgical history, family history, social history, and previous encounter notes.   Time spent on visit including pre-visit chart review and post-visit care and charting was 32 minutes.    Rocky Gladden, PA-C

## 2023-10-08 ENCOUNTER — Telehealth (INDEPENDENT_AMBULATORY_CARE_PROVIDER_SITE_OTHER): Payer: Self-pay | Admitting: *Deleted

## 2023-10-08 NOTE — Telephone Encounter (Signed)
  A prior authorization was submitted thru coverMymeds for [facility] 0.25MG /0.5ML.waiting for approval.

## 2023-10-14 ENCOUNTER — Telehealth (INDEPENDENT_AMBULATORY_CARE_PROVIDER_SITE_OTHER): Payer: Self-pay | Admitting: *Deleted

## 2023-10-14 NOTE — Telephone Encounter (Signed)
An approval has been granted from [facility] for the Wegovy 0.25 mg/0.5 ml, Approval from : 10/14/2023-04/11/2024 Approval [id number] Left a voice message with patient of the approval.

## 2023-10-14 NOTE — Telephone Encounter (Signed)
Called patient, no answer,left message to update concerning her authorization for medication(Wegovy-0.25 mg 0.5Ml),denied and that she should be receiving a letter with reason for the denial, will be able to discuss other options at next appointment.

## 2023-11-04 ENCOUNTER — Encounter (INDEPENDENT_AMBULATORY_CARE_PROVIDER_SITE_OTHER): Payer: Self-pay | Admitting: Physician Assistant

## 2023-11-04 ENCOUNTER — Ambulatory Visit (INDEPENDENT_AMBULATORY_CARE_PROVIDER_SITE_OTHER): Payer: Medicaid Other | Admitting: Physician Assistant

## 2023-11-04 VITALS — BP 115/75 | HR 69 | Temp 97.9°F | Ht 68.0 in | Wt 283.0 lb

## 2023-11-04 DIAGNOSIS — F32A Depression, unspecified: Secondary | ICD-10-CM

## 2023-11-04 DIAGNOSIS — F3289 Other specified depressive episodes: Secondary | ICD-10-CM

## 2023-11-04 DIAGNOSIS — E559 Vitamin D deficiency, unspecified: Secondary | ICD-10-CM

## 2023-11-04 DIAGNOSIS — Z6841 Body Mass Index (BMI) 40.0 and over, adult: Secondary | ICD-10-CM

## 2023-11-04 DIAGNOSIS — F5089 Other specified eating disorder: Secondary | ICD-10-CM

## 2023-11-04 DIAGNOSIS — E7800 Pure hypercholesterolemia, unspecified: Secondary | ICD-10-CM

## 2023-11-04 DIAGNOSIS — E669 Obesity, unspecified: Secondary | ICD-10-CM

## 2023-11-04 DIAGNOSIS — R7303 Prediabetes: Secondary | ICD-10-CM

## 2023-11-04 MED ORDER — WEGOVY 0.5 MG/0.5ML ~~LOC~~ SOAJ: 0.5000 mg | SUBCUTANEOUS | 0 refills | Status: DC

## 2023-11-04 MED ORDER — VITAMIN D (ERGOCALCIFEROL) 1.25 MG (50000 UNIT) PO CAPS: 50000.0000 [IU] | ORAL_CAPSULE | ORAL | 0 refills | Status: DC

## 2023-11-04 MED ORDER — METFORMIN HCL 500 MG PO TABS: 500.0000 mg | ORAL_TABLET | Freq: Two times a day (BID) | ORAL | 0 refills | Status: DC

## 2023-11-04 MED ORDER — BUPROPION HCL ER (SR) 150 MG PO TB12: 150.0000 mg | ORAL_TABLET | Freq: Two times a day (BID) | ORAL | 0 refills | Status: DC

## 2023-11-04 NOTE — Progress Notes (Addendum)
 SUBJECTIVE: Discussed the use of AI scribe software for clinical note transcription with the patient, who gave verbal consent to proceed.  Chief Complaint: Obesity  Interim History: She is down 3 lbs since last visit.   Pharmacotherapy: Wegovy started ~ 10/07/23   Metformin started- 11/17/22   Wellbutrin started 12/18/22- (Has taken in the past)   Maressa is here to discuss her progress with her obesity treatment plan. She is on the Category 3 Plan and states she is following her eating plan approximately 50 % of the time. She states she is exercising walking 30 minutes 2 times per week.  LATISHA LASCH is a 53 year old female with obesity who presents for follow-up of her obesity treatment plan.  She is currently on Wegovy, with her fourth injection scheduled for this Saturday. She has noticed feeling full faster, although it has not yet curbed her cravings. No side effects such as constipation, diarrhea, or nausea have been experienced, and she administers the injection at night to avoid potential side effects.  Her past medical history includes prediabetes, vitamin D deficiency, and hyperlipidemia. She is taking Wellbutrin, which she feels helps with energy, focus, and cravings, and metformin twice a day, which may help maintain bowel regularity. She also takes vitamin D supplements.  She deals with emotional eating and cravings, mentioning that the chaotic home environment with her children being home from school for snow days disrupts her schedule and focus on health and nutrition. She hopes to resume walking if the weather improves.  OBJECTIVE: Visit Diagnoses: Problem List Items Addressed This Visit     Depression   Relevant Medications   buPROPion (WELLBUTRIN SR) 150 MG 12 hr tablet   Vitamin D deficiency   Relevant Medications   Vitamin D, Ergocalciferol, (DRISDOL) 1.25 MG (50000 UNIT) CAPS capsule   Pre-diabetes   Relevant Medications   Semaglutide-Weight Management  (WEGOVY) 0.5 MG/0.5ML SOAJ   metFORMIN (GLUCOPHAGE) 500 MG tablet   Obesity, Beginning BMI 43.79   Relevant Medications   Semaglutide-Weight Management (WEGOVY) 0.5 MG/0.5ML SOAJ   metFORMIN (GLUCOPHAGE) 500 MG tablet   BMI 40.0-44.9, adult (HCC) Current BMI 42.8   Relevant Medications   Semaglutide-Weight Management (WEGOVY) 0.5 MG/0.5ML SOAJ   metFORMIN (GLUCOPHAGE) 500 MG tablet   Hypercholesterolemia - Primary   Relevant Medications   Semaglutide-Weight Management (WEGOVY) 0.5 MG/0.5ML SOAJ  Obesity Shantana Christon, 53, is on [place] for primary indication of hyperlipidemia in addition to obesity management. She will take her fourth injection this Saturday. Early satiety noted, no gastrointestinal side effects reported. Discussed maintaining muscle mass during weight loss and managing potential side effects of Wegovy, including nausea and constipation. Emphasized hydration, smaller protein-based meals, and avoiding fatty foods and simple carbs. - Increase Wegovy dose to 0.5 mg - Monitor muscle mass - Follow up in one month to assess response to increased dose - Order follow-up labs in April to evaluate progress  Hyperlipidemia LDL is not at goal. Medication(s): ZOXWRU Cardiovascular risk factors: dyslipidemia, obesity (BMI >= 30 kg/m2), and sedentary lifestyle  Lab Results  Component Value Date   CHOL 216 (H) 06/30/2023   HDL 91 06/30/2023   LDLCALC 113 (H) 06/30/2023   TRIG 71 06/30/2023   CHOLHDL 2.6 07/02/2022   CHOLHDL 2.4 02/22/2020   CHOLHDL 2.8 10/02/2017   Lab Results  Component Value Date   ALT 12 06/30/2023   AST 22 06/30/2023   ALKPHOS 111 06/30/2023   BILITOT 0.5 06/30/2023   The 10-year ASCVD  risk score (Arnett DK, et al., 2019) is: 1.3%   Values used to calculate the score:     Age: 89 years     Sex: Female     Is Non-Hispanic African American: No     Diabetic: Yes     Tobacco smoker: No     Systolic Blood Pressure: 115 mmHg     Is BP treated: No      HDL Cholesterol: 91 mg/dL     Total Cholesterol: 216 mg/dL  Plan: Continue/ Increase Wegovy to 0.5 mg weekly for hyperlipidemia as well as to promote healthy weight loss.  Continue to work on nutrition plan -decreasing simple carbohydrates, increasing lean proteins, decreasing saturated fats and cholesterol , avoiding trans fats and exercise as able to promote weight loss, improve lipids and decrease cardiovascular risks.   Emotional Eating/Cravings Aylah reports Wellbutrin is helping with energy, focus, and cravings. - Continue Wellbutrin - Refill Wellbutrin prescription  Prediabetes Glendola is on metformin 500 mg twice daily to manage blood glucose levels and prevent constipation, a potential side effect of Wegovy. Lab Results  Component Value Date   HGBA1C 5.9 (H) 06/30/2023   HGBA1C 5.7 (H) 01/27/2023   HGBA1C 5.8 (H) 07/02/2022   Lab Results  Component Value Date   LDLCALC 113 (H) 06/30/2023   CREATININE 0.79 06/30/2023   Continue working on nutrition plan to decrease simple carbohydrates, increase lean proteins and exercise to promote weight loss, improve glycemic control and prevent progression to Type 2 diabetes.  - Continue/refill metformin 500 mg twice daily  Vitamin D Deficiency Vitamin D is not at goal of 50.  Most recent vitamin D level was 28.3. She is on  prescription ergocalciferol 50,000 IU weekly. No N/V or muscle weakness with Ergo.  Lab Results  Component Value Date   VD25OH 28.3 (L) 06/30/2023   VD25OH 44.1 01/27/2023   VD25OH 19.0 (L) 07/02/2022    Plan: Continue and refill  prescription ergocalciferol 50,000 IU weekly Low vitamin D levels can be associated with adiposity and may result in leptin resistance and weight gain. Also associated with fatigue.  Currently on vitamin D supplementation without any adverse effects such as nausea, vomiting or muscle weakness.  Recheck labs at visit in April.   General Health Maintenance Advised to stay  hydrated, eat smaller protein-based meals, and avoid fatty foods and simple carbs to manage potential side effects of Wegovy. - Encourage hydration and fiber intake to prevent constipation - Advise smaller protein-based meals if nausea occurs - Avoid fatty foods and simple carbs  Follow-up - Schedule follow-up appointment on Tuesday, March 18th at 11:00 AM - Schedule fasting labs and follow-up appointment in early April.  Vitals Temp: 97.9 F (36.6 C) BP: 115/75 Pulse Rate: 69 SpO2: 98 %   Anthropometric Measurements Height: 5\' 8"  (1.727 m) Weight: 283 lb (128.4 kg) BMI (Calculated): 43.04 Weight at Last Visit: 287 lb Weight Lost Since Last Visit: 3 lb Weight Gained Since Last Visit: 0 lb Starting Weight: 288 lb Total Weight Loss (lbs): 5 lb (2.268 kg) Peak Weight: 300 lb   Body Composition  Body Fat %: 48.4 % Fat Mass (lbs): 137.4 lbs Muscle Mass (lbs): 139 lbs Total Body Water (lbs): 92.6 lbs Visceral Fat Rating : 15   Other Clinical Data Fasting: no Labs: no Today's Visit #: 23 Starting Date: 07/02/22     ASSESSMENT AND PLAN:  Diet: Mckinnley is currently in the action stage of change. As such, her goal is to  continue with weight loss efforts. She has agreed to Category 3 Plan.  Exercise: Ginna has been instructed to work up to a goal of 150 minutes of combined cardio and strengthening exercise per week for weight loss and overall health benefits.   Behavior Modification:  We discussed the following Behavioral Modification Strategies today: increasing lean protein intake, decreasing simple carbohydrates, increasing vegetables, increase H2O intake, increase high fiber foods, no skipping meals, meal planning and cooking strategies, emotional eating strategies , avoiding temptations, and planning for success. We discussed various medication options to help Sari with her weight loss efforts and we both agreed to increase Wegovy to 0.5 mg weekly , continue  metformin 500 mg BID and Wellbutrin SR 150 mg BID and continue to work on nutritional and behavioral strategies to promote weight loss.  .  Return in about 4 weeks (around 12/02/2023).Marland Kitchen She was informed of the importance of frequent follow up visits to maximize her success with intensive lifestyle modifications for her multiple health conditions.  Attestation Statements:   Reviewed by clinician on day of visit: allergies, medications, problem list, medical history, surgical history, family history, social history, and previous encounter notes.   Time spent on visit including pre-visit chart review and post-visit care and charting was 31 minutes.    Zhaniya Swallows, PA-C

## 2023-11-10 ENCOUNTER — Encounter: Payer: Self-pay | Admitting: Internal Medicine

## 2023-11-19 DIAGNOSIS — F4310 Post-traumatic stress disorder, unspecified: Secondary | ICD-10-CM | POA: Diagnosis not present

## 2023-11-26 DIAGNOSIS — F4310 Post-traumatic stress disorder, unspecified: Secondary | ICD-10-CM | POA: Diagnosis not present

## 2023-11-30 NOTE — Progress Notes (Unsigned)
 SUBJECTIVE: Discussed the use of AI scribe software for clinical note transcription with the patient, who gave verbal consent to proceed.  Chief Complaint: Obesity  Interim History: She is down 3 lbs since her last visit.  Mia Parker is here to discuss her progress with her obesity treatment plan. She is on the Category 3 Plan and states she is following her eating plan approximately 60 % of the time. She states she is exercising walking 20 minutes 5 times per week.  Mia Parker is a 53 year old female with obesity who presents for follow-up of her obesity treatment plan.  She is currently on Wegovy 0.5 mg weekly and has experienced a weight loss of three pounds. She notes a significant reduction in appetite, stating she 'hardly ever feels hungry' and is careful not to skip meals to maintain her metabolism. She experiences occasional diarrhea, particularly after consuming ice cream. She continues to take metformin 500 mg twice daily, ergocalciferol 50,000 units weekly, and Wellbutrin 150 mg twice daily.  Her cravings for sweets have decreased significantly, though she occasionally desires something sweet. She is mindful of maintaining a healthy diet, incorporating protein sources like tuna, canned chicken into her meals. She has resumed walking with a friend and is planning an active vacation at the beach during Easter.  Her sleep is consistently good, and she feels satisfied with her meals. No issues with constipation. 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 Fasting labs next OV OBJECTIVE: Visit Diagnoses: Problem List Items Addressed This Visit     Depression   Relevant Medications   buPROPion (WELLBUTRIN SR) 150 MG 12 hr tablet   Vitamin D deficiency   Relevant Medications   Vitamin D, Ergocalciferol, (DRISDOL) 1.25 MG (50000 UNIT) CAPS capsule   Pre-diabetes - Primary   Relevant Medications   metFORMIN (GLUCOPHAGE) 500 MG tablet    Semaglutide-Weight Management (WEGOVY) 0.5 MG/0.5ML SOAJ   Obesity, Beginning BMI 43.79   Relevant Medications   metFORMIN (GLUCOPHAGE) 500 MG tablet   Semaglutide-Weight Management (WEGOVY) 0.5 MG/0.5ML SOAJ   BMI 40.0-44.9, adult (HCC) Current BMI 42.8   Relevant Medications   metFORMIN (GLUCOPHAGE) 500 MG tablet   Semaglutide-Weight Management (WEGOVY) 0.5 MG/0.5ML SOAJ   Hypercholesterolemia   Relevant Medications   Semaglutide-Weight Management (WEGOVY) 0.5 MG/0.5ML SOAJ  Obesity [name] is undergoing treatment for obesity with Wegovy 0.5 mg weekly, resulting in a weight loss of three pounds and reduced appetite. Emphasis is placed on maintaining muscle mass and losing adipose tissue, targeting up to two pounds of weight loss per week. The goal is gradual weight loss while preserving muscle mass to enhance metabolic rate. - Continue Wegovy 0.5 mg weekly - Encourage a healthy diet with approximately 100 grams of protein daily - Encourage regular physical activity, including walking - Monitor weight and appetite Meds ordered this encounter  Medications   buPROPion (WELLBUTRIN SR) 150 MG 12 hr tablet    Sig: Take 1 tablet (150 mg total) by mouth 2 (two) times daily.    Dispense:  60 tablet    Refill:  0   metFORMIN (GLUCOPHAGE) 500 MG tablet    Sig: Take 1 tablet (500 mg total) by mouth daily with breakfast.    Dispense:  30 tablet    Refill:  0   Vitamin D, Ergocalciferol, (DRISDOL) 1.25 MG (50000 UNIT) CAPS capsule    Sig: Take 1 capsule (50,000 Units total) by mouth every 7 (seven) days.  Dispense:  5 capsule    Refill:  0   Semaglutide-Weight Management (WEGOVY) 0.5 MG/0.5ML SOAJ    Sig: Inject 0.5 mg into the skin once a week.    Dispense:  2 mL    Refill:  0   Hyperlipidemia Started wegovy for primary indication of hyperlipidemia as well as for medical weight loss. Last lipids Lab Results  Component Value Date   CHOL 216 (H) 06/30/2023   HDL 91 06/30/2023    LDLCALC 113 (H) 06/30/2023   TRIG 71 06/30/2023   CHOLHDL 2.6 07/02/2022   Continue to work on nutrition plan -decreasing simple carbohydrates, increasing lean proteins, decreasing saturated fats and cholesterol , avoiding trans fats and exercise as able to promote weight loss, improve lipids and decrease cardiovascular risks. - Order fasting lipid panel during next lab visit  Emotional Eating Behavior Khalaya is on bupropion 150 mg twice daily for emotional eating behavior, experiencing reduced cravings. Consideration is given to reducing the dose to once daily to decrease medication burden. - Reduce bupropion to 150 mg once daily if desired - Monitor for changes in cravings and adjust dose if necessary  Prediabetes [name] is on metformin 500 mg twice daily for prediabetes, reporting occasional diarrhea possibly related to metformin. A reduction to once daily is suggested to alleviate gastrointestinal symptoms. - Reduce metformin to 500 mg once daily - Monitor for improvement in gastrointestinal symptoms   Vitamin D Deficiency [name] is being treated for vitamin D deficiency with ergocalciferol 50,000 units weekly. Low vitamin D levels can be associated with adiposity and may result in leptin resistance and weight gain. Also associated with fatigue.  Currently on vitamin D supplementation without any adverse effects such as nausea, vomiting or muscle weakness.  Last vitamin D Lab Results  Component Value Date   VD25OH 28.3 (L) 06/30/2023   - Continue/refill ergocalciferol 50,000 units weekly  Vitals Temp: 98.1 F (36.7 C) BP: 130/80 Pulse Rate: 74 SpO2: 98 %   Anthropometric Measurements Height: 5\' 8"  (1.727 m) Weight: 280 lb (127 kg) BMI (Calculated): 42.58 Weight at Last Visit: 283 lb Weight Lost Since Last Visit: 3 lb Weight Gained Since Last Visit: 0 Starting Weight: 288 lb Total Weight Loss (lbs): 8 lb (3.629 kg) Peak Weight: 300 lb   Body Composition  Body Fat  %: 47.6 % Fat Mass (lbs): 133.6 lbs Muscle Mass (lbs): 139.8 lbs Total Body Water (lbs): 93 lbs Visceral Fat Rating : 15   Other Clinical Data Fasting: no Labs: no Today's Visit #: 24 Starting Date: 07/02/22     ASSESSMENT AND PLAN:  Diet: [name] is currently in the action stage of change. As such, her goal is to continue with weight loss efforts and has agreed to the Category 3 Plan.   Exercise:  For substantial health benefits, adults should do at least 150 minutes (2 hours and 30 minutes) a week of moderate-intensity, or 75 minutes (1 hour and 15 minutes) a week of vigorous-intensity aerobic physical activity, or an equivalent combination of moderate- and vigorous-intensity aerobic activity. Aerobic activity should be performed in episodes of at least 10 minutes, and preferably, it should be spread throughout the week. and Adults should also include muscle-strengthening activities that involve all major muscle groups on 2 or more days a week.  Behavior Modification:  We discussed the following Behavioral Modification Strategies today: increasing lean protein intake, decreasing simple carbohydrates, increasing vegetables, increase H2O intake, increase high fiber foods, no skipping meals, meal planning and cooking  strategies, avoiding temptations, and planning for success. We discussed various medication options to help Sera with her weight loss efforts and we both agreed to continue Wegovy 0.5 mg weekly for hyperlipidemia as well as medical weight loss and continue to work on nutritional and behavioral strategies to promote weight loss.  .  Return in about 4 weeks (around 12/29/2023).Marland Kitchen She was informed of the importance of frequent follow up visits to maximize her success with intensive lifestyle modifications for her multiple health conditions.  Attestation Statements:   Reviewed by clinician on day of visit: allergies, medications, problem list, medical history, surgical  history, family history, social history, and previous encounter notes.   Time spent on visit including pre-visit chart review and post-visit care and charting was 25 minutes  Jazae Gandolfi,PA-C

## 2023-12-01 ENCOUNTER — Ambulatory Visit (INDEPENDENT_AMBULATORY_CARE_PROVIDER_SITE_OTHER): Payer: Medicaid Other | Admitting: Physician Assistant

## 2023-12-01 ENCOUNTER — Encounter (INDEPENDENT_AMBULATORY_CARE_PROVIDER_SITE_OTHER): Payer: Self-pay | Admitting: Physician Assistant

## 2023-12-01 VITALS — BP 130/80 | HR 74 | Temp 98.1°F | Ht 68.0 in | Wt 280.0 lb

## 2023-12-01 DIAGNOSIS — E7800 Pure hypercholesterolemia, unspecified: Secondary | ICD-10-CM

## 2023-12-01 DIAGNOSIS — E559 Vitamin D deficiency, unspecified: Secondary | ICD-10-CM | POA: Diagnosis not present

## 2023-12-01 DIAGNOSIS — Z6841 Body Mass Index (BMI) 40.0 and over, adult: Secondary | ICD-10-CM

## 2023-12-01 DIAGNOSIS — R7303 Prediabetes: Secondary | ICD-10-CM

## 2023-12-01 DIAGNOSIS — F5089 Other specified eating disorder: Secondary | ICD-10-CM

## 2023-12-01 DIAGNOSIS — E669 Obesity, unspecified: Secondary | ICD-10-CM | POA: Diagnosis not present

## 2023-12-01 DIAGNOSIS — F32A Depression, unspecified: Secondary | ICD-10-CM | POA: Diagnosis not present

## 2023-12-01 DIAGNOSIS — F3289 Other specified depressive episodes: Secondary | ICD-10-CM

## 2023-12-01 MED ORDER — METFORMIN HCL 500 MG PO TABS: 500.0000 mg | ORAL_TABLET | Freq: Every day | ORAL | 0 refills | Status: DC

## 2023-12-01 MED ORDER — VITAMIN D (ERGOCALCIFEROL) 1.25 MG (50000 UNIT) PO CAPS
50000.0000 [IU] | ORAL_CAPSULE | ORAL | 0 refills | Status: DC
Start: 2023-12-01 — End: 2024-01-19

## 2023-12-01 MED ORDER — BUPROPION HCL ER (SR) 150 MG PO TB12: 150.0000 mg | ORAL_TABLET | Freq: Two times a day (BID) | ORAL | 0 refills | Status: DC

## 2023-12-01 MED ORDER — WEGOVY 0.5 MG/0.5ML ~~LOC~~ SOAJ: 0.5000 mg | SUBCUTANEOUS | 0 refills | Status: DC

## 2023-12-09 DIAGNOSIS — F4310 Post-traumatic stress disorder, unspecified: Secondary | ICD-10-CM | POA: Diagnosis not present

## 2023-12-16 DIAGNOSIS — F4310 Post-traumatic stress disorder, unspecified: Secondary | ICD-10-CM | POA: Diagnosis not present

## 2023-12-22 ENCOUNTER — Ambulatory Visit (INDEPENDENT_AMBULATORY_CARE_PROVIDER_SITE_OTHER): Payer: Medicaid Other | Admitting: Physician Assistant

## 2023-12-30 DIAGNOSIS — F4310 Post-traumatic stress disorder, unspecified: Secondary | ICD-10-CM | POA: Diagnosis not present

## 2024-01-05 ENCOUNTER — Other Ambulatory Visit (INDEPENDENT_AMBULATORY_CARE_PROVIDER_SITE_OTHER): Payer: Self-pay | Admitting: Physician Assistant

## 2024-01-05 DIAGNOSIS — R7303 Prediabetes: Secondary | ICD-10-CM

## 2024-01-05 DIAGNOSIS — E7800 Pure hypercholesterolemia, unspecified: Secondary | ICD-10-CM

## 2024-01-05 DIAGNOSIS — E669 Obesity, unspecified: Secondary | ICD-10-CM

## 2024-01-05 MED ORDER — WEGOVY 0.5 MG/0.5ML ~~LOC~~ SOAJ: 0.5000 mg | SUBCUTANEOUS | 0 refills | Status: DC

## 2024-01-05 NOTE — Telephone Encounter (Signed)
 LAST APPOINTMENT DATE: 12/01/2023 NEXT APPOINTMENT DATE: 01/19/2024   [facility] [id number] Jonette Nestle, [place] - [address] [place] [address] Phone: [phone] Fax: [phone]  Patient is requesting a refill of the following medications: Requested Prescriptions   Pending Prescriptions Disp Refills   Semaglutide -Weight Management (WEGOVY ) 0.5 MG/0.5ML SOAJ 2 mL 0    Sig: Inject 0.5 mg into the skin once a week.    Date last filled: 12/01/2023 Previously prescribed by Arther Larve, PA  Lab Results  Component Value Date   HGBA1C 5.9 (H) 06/30/2023   HGBA1C 5.7 (H) 01/27/2023   HGBA1C 5.8 (H) 07/02/2022   Lab Results  Component Value Date   LDLCALC 113 (H) 06/30/2023   CREATININE 0.79 06/30/2023   Lab Results  Component Value Date   VD25OH 28.3 (L) 06/30/2023   VD25OH 44.1 01/27/2023   VD25OH 19.0 (L) 07/02/2022    BP Readings from Last 3 Encounters:  12/01/23 130/80  11/04/23 115/75  10/07/23 125/76

## 2024-01-06 DIAGNOSIS — F4310 Post-traumatic stress disorder, unspecified: Secondary | ICD-10-CM | POA: Diagnosis not present

## 2024-01-19 ENCOUNTER — Encounter (INDEPENDENT_AMBULATORY_CARE_PROVIDER_SITE_OTHER): Payer: Self-pay | Admitting: Physician Assistant

## 2024-01-19 ENCOUNTER — Ambulatory Visit (INDEPENDENT_AMBULATORY_CARE_PROVIDER_SITE_OTHER): Admitting: Physician Assistant

## 2024-01-19 VITALS — BP 116/76 | HR 58 | Temp 97.6°F | Ht 68.0 in | Wt 279.0 lb

## 2024-01-19 DIAGNOSIS — F5089 Other specified eating disorder: Secondary | ICD-10-CM | POA: Diagnosis not present

## 2024-01-19 DIAGNOSIS — Z6841 Body Mass Index (BMI) 40.0 and over, adult: Secondary | ICD-10-CM | POA: Diagnosis not present

## 2024-01-19 DIAGNOSIS — E559 Vitamin D deficiency, unspecified: Secondary | ICD-10-CM | POA: Diagnosis not present

## 2024-01-19 DIAGNOSIS — E669 Obesity, unspecified: Secondary | ICD-10-CM | POA: Diagnosis not present

## 2024-01-19 DIAGNOSIS — F3289 Other specified depressive episodes: Secondary | ICD-10-CM

## 2024-01-19 DIAGNOSIS — R7303 Prediabetes: Secondary | ICD-10-CM | POA: Diagnosis not present

## 2024-01-19 DIAGNOSIS — E7800 Pure hypercholesterolemia, unspecified: Secondary | ICD-10-CM

## 2024-01-19 DIAGNOSIS — R5383 Other fatigue: Secondary | ICD-10-CM | POA: Diagnosis not present

## 2024-01-19 MED ORDER — BUPROPION HCL ER (SR) 150 MG PO TB12: 150.0000 mg | ORAL_TABLET | Freq: Two times a day (BID) | ORAL | 0 refills | Status: DC

## 2024-01-19 MED ORDER — METFORMIN HCL 500 MG PO TABS: 500.0000 mg | ORAL_TABLET | Freq: Every day | ORAL | 0 refills | Status: DC

## 2024-01-19 MED ORDER — WEGOVY 1 MG/0.5ML ~~LOC~~ SOAJ
1.0000 mg | SUBCUTANEOUS | 0 refills | Status: DC
Start: 2024-01-19 — End: 2024-02-23

## 2024-01-19 MED ORDER — VITAMIN D (ERGOCALCIFEROL) 1.25 MG (50000 UNIT) PO CAPS
50000.0000 [IU] | ORAL_CAPSULE | ORAL | 0 refills | Status: DC
Start: 2024-01-19 — End: 2024-02-23

## 2024-01-19 NOTE — Progress Notes (Signed)
 SUBJECTIVE: Discussed the use of AI scribe software for clinical note transcription with the patient, who gave verbal consent to proceed.  Chief Complaint: Obesity  Interim History: She is down 1 lb since her last visit  Mia Parker is here to discuss her progress with her obesity treatment plan. She is on the Category 3 Plan and states she is following her eating plan approximately 60 % of the time. She states she is exercising walking 30 minutes 5 times per week.  Mia Parker is a 53 year old female with obesity and prediabetes who presents for follow-up of her obesity treatment plan.  She has been on Wegovy  0.5 mg weekly for three months, experiencing decreased hunger but persistent sweet cravings. No side effects such as nausea, vomiting, constipation, diarrhea, changes in vision, or mood changes. She takes Wegovy  before bed.  She is also on metformin  once daily for insulin  resistance and bupropion  twice daily for emotional eating behavior, which she feels may be beneficial. No interference with sleep from her medications. Additionally, she takes vitamin D  supplements without issues.  She has resumed [place] is at swim practice. She recently returned from a trip to [place] for Easter break and has no immediate travel plans until after the swim team season.   She has lost nine pounds from her previous weight and is working on further weight loss.  Pharmacotherapy: Wegovy  started 10/07/23    Wegovy  0.5 mg started 11/04/23     OBJECTIVE: Visit Diagnoses: Problem List Items Addressed This Visit     Other fatigue   Relevant Orders   Vitamin B12   CBC with Differential/Platelet   TSH   Depression   Relevant Medications   buPROPion  (WELLBUTRIN  SR) 150 MG 12 hr tablet   Vitamin D  deficiency   Relevant Medications   Vitamin D , Ergocalciferol , (DRISDOL ) 1.25 MG (50000 UNIT) CAPS capsule   Other Relevant Orders   VITAMIN D  25 Hydroxy  (Vit-D Deficiency, Fractures)   Pre-diabetes - Primary   Relevant Medications   metFORMIN  (GLUCOPHAGE ) 500 MG tablet   Semaglutide -Weight Management (WEGOVY ) 1 MG/0.5ML SOAJ   Other Relevant Orders   CMP14+EGFR   Hemoglobin A1c   Insulin , random   Obesity, Beginning BMI 43.79   Relevant Medications   metFORMIN  (GLUCOPHAGE ) 500 MG tablet   Semaglutide -Weight Management (WEGOVY ) 1 MG/0.5ML SOAJ   BMI 40.0-44.9, adult (HCC) Current BMI 42.8   Relevant Medications   metFORMIN  (GLUCOPHAGE ) 500 MG tablet   Semaglutide -Weight Management (WEGOVY ) 1 MG/0.5ML SOAJ   Hypercholesterolemia   Relevant Orders   Lipid Panel With LDL/HDL Ratio  Obesity Obesity management with Wegovy  has resulted in a weight loss of nine pounds. She reports decreased hunger but still experiences sweet cravings. No side effects such as nausea, vomiting, constipation, diarrhea, vision changes, or mood changes have been reported. The decision to increase the Wegovy  dosage is based on the need for further appetite and craving suppression. She is maintaining muscle mass well. - Increase Wegovy  dosage to 1 mg. - Monitor for side effects such as constipation, especially with fatty foods, and ensure adequate hydration. - Continue metformin  once daily to aid with insulin  resistance. - Continue bupropion  twice daily. - Send prescriptions to [facility]. - Schedule follow-up appointments on June 10th at 7 AM and July 8th at 2 PM. Meds ordered this encounter  Medications   buPROPion  (WELLBUTRIN  SR) 150 MG 12 hr tablet    Sig:  Take 1 tablet (150 mg total) by mouth 2 (two) times daily.    Dispense:  60 tablet    Refill:  0   metFORMIN  (GLUCOPHAGE ) 500 MG tablet    Sig: Take 1 tablet (500 mg total) by mouth daily with breakfast.    Dispense:  30 tablet    Refill:  0   Vitamin D , Ergocalciferol , (DRISDOL ) 1.25 MG (50000 UNIT) CAPS capsule    Sig: Take 1 capsule (50,000 Units total) by mouth every 7 (seven) days.     Dispense:  5 capsule    Refill:  0   Semaglutide -Weight Management (WEGOVY ) 1 MG/0.5ML SOAJ    Sig: Inject 1 mg into the skin once a week.    Dispense:  2 mL    Refill:  0    Prediabetes Prediabetes is being managed with metformin  to address insulin  resistance. The current regimen appears to be effective as part of the obesity management plan. Lab Results  Component Value Date   HGBA1C 5.9 (H) 06/30/2023   HGBA1C 5.7 (H) 01/27/2023   HGBA1C 5.8 (H) 07/02/2022   Lab Results  Component Value Date   LDLCALC 113 (H) 06/30/2023   CREATININE 0.79 06/30/2023   INSULIN   Date Value Ref Range Status  06/30/2023 13.5 2.6 - 24.9 uIU/mL Final  ]Continue working on nutrition plan to decrease simple carbohydrates, increase lean proteins and exercise to promote weight loss, improve glycemic control and prevent progression to Type 2 diabetes.  - Continue/refill  metformin  500 mg once daily. Recheck fasting labs today.   Hyperlipidemia LDL is not at goal. Medication(s): Wegovy  Cardiovascular risk factors: dyslipidemia and obesity (BMI >= 30 kg/m2)  Lab Results  Component Value Date   CHOL 216 (H) 06/30/2023   HDL 91 06/30/2023   LDLCALC 113 (H) 06/30/2023   TRIG 71 06/30/2023   CHOLHDL 2.6 07/02/2022   CHOLHDL 2.4 02/22/2020   CHOLHDL 2.8 10/02/2017   Lab Results  Component Value Date   ALT 12 06/30/2023   AST 22 06/30/2023   ALKPHOS 111 06/30/2023   BILITOT 0.5 06/30/2023   The 10-year ASCVD risk score (Arnett DK, et al., 2019) is: 1.3%   Values used to calculate the score:     Age: 8 years     Sex: Female     Is Non-Hispanic African American: No     Diabetic: Yes     Tobacco smoker: No     Systolic Blood Pressure: 116 mmHg     Is BP treated: No     HDL Cholesterol: 91 mg/dL     Total Cholesterol: 216 mg/dL  Plan: Continue Wegovy  and increase dose to 1 mg weekly.  Continue to work on nutrition plan -decreasing simple carbohydrates, increasing lean proteins, decreasing  saturated fats and cholesterol , avoiding trans fats and exercise as able to promote weight loss, improve lipids and decrease cardiovascular risks. Recheck fasting lipids today.    Eating disorder/emotional eating Matea has had issues with stress/emotional eating. Currently this is moderately controlled. Overall mood is stable. Medication(s): Bupropion  SR 150 mg twice daily  Plan: Continue and refill Bupropion  SR 150 mg twice daily  Fatigue Will recheck labs, CBC, B 12 and vitamin D  as well as TSH levels and treatment accordingly if indicated.    Vitamin D  deficiency Vitamin D  deficiency is being managed with Ergocalciferol  50,000 units weekly. . No issues with the current regimen have been reported. - Continue/refill Ergocalciferol  50,000 units weekly Recheck vitamin D  levels Low vitamin  D levels can be associated with adiposity and may result in leptin resistance and weight gain. Also associated with fatigue.  Currently on vitamin D  supplementation without any adverse effects such as nausea, vomiting or muscle weakness.    Vitals Temp: 97.6 F (36.4 C) BP: 116/76 Pulse Rate: (!) 58 SpO2: 99 %   Anthropometric Measurements Height: 5\' 8"  (1.727 m) Weight: 279 lb (126.6 kg) BMI (Calculated): 42.43 Weight at Last Visit: 280 lb Weight Lost Since Last Visit: 1 lb Weight Gained Since Last Visit: 0 Starting Weight: 288 lb Total Weight Loss (lbs): 9 lb (4.082 kg) Peak Weight: 300 lb   Body Composition  Body Fat %: 48 % Fat Mass (lbs): 134.2 lbs Muscle Mass (lbs): 137.8 lbs Total Body Water (lbs): 92 lbs Visceral Fat Rating : 15   Other Clinical Data Fasting: yes Labs: yes Today's Visit #: 25 Starting Date: 07/02/22     ASSESSMENT AND PLAN:  Diet: Ife is currently in the action stage of change. As such, her goal is to continue with weight loss efforts. She has agreed to Category 3 Plan.  Exercise: Aleane has been instructed to work up to a goal of 150  minutes of combined cardio and strengthening exercise per week for weight loss and overall health benefits.   Behavior Modification:  We discussed the following Behavioral Modification Strategies today: increasing lean protein intake, decreasing simple carbohydrates, increasing vegetables, increase H2O intake, increase high fiber foods, no skipping meals, better snacking choices, emotional eating strategies , avoiding temptations, and planning for success. We discussed various medication options to help Amelita with her weight loss efforts and we both agreed to increase Wegovy  to 1 mg weekly for hyperlipidemia and medical weight loss and continue to work on nutritional and behavioral strategies to promote weight loss.  .  Return in about 4 weeks (around 02/16/2024).Aaron Aas She was informed of the importance of frequent follow up visits to maximize her success with intensive lifestyle modifications for her multiple health conditions.  Attestation Statements:   Reviewed by clinician on day of visit: allergies, medications, problem list, medical history, surgical history, family history, social history, and previous encounter notes.   Time spent on visit including pre-visit chart review and post-visit care and charting was 22 minutes.    Diannah Rindfleisch, PA-C

## 2024-01-20 ENCOUNTER — Encounter (INDEPENDENT_AMBULATORY_CARE_PROVIDER_SITE_OTHER): Payer: Self-pay | Admitting: Physician Assistant

## 2024-01-20 LAB — CMP14+EGFR
ALT: 10 IU/L (ref 0–32)
AST: 20 IU/L (ref 0–40)
Albumin: 4.4 g/dL (ref 3.8–4.9)
Alkaline Phosphatase: 111 IU/L (ref 44–121)
BUN/Creatinine Ratio: 10 (ref 9–23)
BUN: 9 mg/dL (ref 6–24)
Bilirubin Total: 0.6 mg/dL (ref 0.0–1.2)
CO2: 21 mmol/L (ref 20–29)
Calcium: 9.5 mg/dL (ref 8.7–10.2)
Chloride: 100 mmol/L (ref 96–106)
Creatinine, Ser: 0.86 mg/dL (ref 0.57–1.00)
Globulin, Total: 2.9 g/dL (ref 1.5–4.5)
Glucose: 75 mg/dL (ref 70–99)
Potassium: 4.4 mmol/L (ref 3.5–5.2)
Sodium: 139 mmol/L (ref 134–144)
Total Protein: 7.3 g/dL (ref 6.0–8.5)
eGFR: 82 mL/min/{1.73_m2} (ref >59)

## 2024-01-20 LAB — HEMOGLOBIN A1C
Est. average glucose Bld gHb Est-mCnc: 117 mg/dL
Hgb A1c MFr Bld: 5.7 % — ABNORMAL HIGH (ref 4.8–5.6)

## 2024-01-20 LAB — INSULIN, RANDOM: INSULIN: 11.4 u[IU]/mL (ref 2.6–24.9)

## 2024-01-20 LAB — CBC WITH DIFFERENTIAL/PLATELET
Basophils Absolute: 0.2 10*3/uL (ref 0.0–0.2)
Basos: 2 %
EOS (ABSOLUTE): 0.7 10*3/uL — ABNORMAL HIGH (ref 0.0–0.4)
Eos: 8 %
Hematocrit: 42.5 % (ref 34.0–46.6)
Hemoglobin: 14.1 g/dL (ref 11.1–15.9)
Immature Grans (Abs): 0 10*3/uL (ref 0.0–0.1)
Immature Granulocytes: 0 %
Lymphocytes Absolute: 2.3 10*3/uL (ref 0.7–3.1)
Lymphs: 26 %
MCH: 28.3 pg (ref 26.6–33.0)
MCHC: 33.2 g/dL (ref 31.5–35.7)
MCV: 85 fL (ref 79–97)
Monocytes Absolute: 0.8 10*3/uL (ref 0.1–0.9)
Monocytes: 9 %
Neutrophils Absolute: 4.9 10*3/uL (ref 1.4–7.0)
Neutrophils: 55 %
Platelets: 344 10*3/uL (ref 150–450)
RBC: 4.99 x10E6/uL (ref 3.77–5.28)
RDW: 13.5 % (ref 11.7–15.4)
WBC: 8.9 10*3/uL (ref 3.4–10.8)

## 2024-01-20 LAB — LIPID PANEL WITH LDL/HDL RATIO
Cholesterol, Total: 192 mg/dL (ref 100–199)
HDL: 81 mg/dL (ref >39)
LDL Chol Calc (NIH): 100 mg/dL — ABNORMAL HIGH (ref 0–99)
LDL/HDL Ratio: 1.2 ratio (ref 0.0–3.2)
Triglycerides: 60 mg/dL (ref 0–149)
VLDL Cholesterol Cal: 11 mg/dL (ref 5–40)

## 2024-01-20 LAB — VITAMIN B12: Vitamin B-12: 378 pg/mL (ref 232–1245)

## 2024-01-20 LAB — VITAMIN D 25 HYDROXY (VIT D DEFICIENCY, FRACTURES): Vit D, 25-Hydroxy: 38.4 ng/mL (ref 30.0–100.0)

## 2024-01-20 LAB — TSH: TSH: 3.48 u[IU]/mL (ref 0.450–4.500)

## 2024-01-20 NOTE — Telephone Encounter (Signed)
 Call to patient, reminded her to check her my chart message.  Will follow up tomorrow.

## 2024-01-29 DIAGNOSIS — F4310 Post-traumatic stress disorder, unspecified: Secondary | ICD-10-CM | POA: Diagnosis not present

## 2024-02-03 DIAGNOSIS — F4310 Post-traumatic stress disorder, unspecified: Secondary | ICD-10-CM | POA: Diagnosis not present

## 2024-02-10 DIAGNOSIS — F4310 Post-traumatic stress disorder, unspecified: Secondary | ICD-10-CM | POA: Diagnosis not present

## 2024-02-16 DIAGNOSIS — F4310 Post-traumatic stress disorder, unspecified: Secondary | ICD-10-CM | POA: Diagnosis not present

## 2024-02-22 NOTE — Progress Notes (Unsigned)
 SUBJECTIVE: Discussed the use of AI scribe software for clinical note transcription with the patient, who gave verbal consent to proceed.  Chief Complaint: Obesity  Interim History: She is down 3 lbs since last visit.  Down 12 lbs overall TBW loss of 4.2%  Mia Parker is here to discuss her progress with her obesity treatment plan. She is on the Category 3 Plan and states she is following her eating plan approximately 50 % of the time. She states she is exercising by walking 30 minutes 5 times per week.  Mia Parker is a 53 year old female who presents for follow-up of her obesity treatment plan.  She is currently on Wegovy  1 mg weekly for medical weight loss and has lost 3 pounds since her last visit, totaling a 12-pound weight loss , representing a 4.2% total body weight loss. No side effects such as nausea, vomiting, diarrhea, constipation, changes in vision, or mood changes from the medication.  She has a history of prediabetes and is taking metformin  500 mg daily without any issues. Her most recent cholesterol panel showed an HDL of 81 and a triglyceride level of 60, LDL 100- improved since starting Wegovy .  She is being treated for vitamin D  deficiency with ergocalciferol  50,000 units once weekly. Her vitamin D  level is currently at 38, and she reports no issues with this treatment.  She is on bupropion  150 mg twice a day for emotional eating and reports no problems with this medication.  She confirms meeting her protein needs and drinking enough water.  Pharmacotherapy:  Started Wegovy  ~ 10/07/23. Currently on Wegovy  1 mg weekly since 01/19/24- Down ~ 10 lbs since starting.      Denies mass in neck, dysphagia, dyspepsia, persistent hoarseness, abdominal pain, or N/V/Constipation or diarrhea. Has annual eye exam. Mood is stable.     Metformin  since 11/2022- little weight loss but improved A1C and insulin  levels.   OBJECTIVE: Visit Diagnoses: Problem List Items Addressed This Visit      Depression   Relevant Medications   buPROPion  (WELLBUTRIN  SR) 150 MG 12 hr tablet   Vitamin D  deficiency   Relevant Medications   Vitamin D , Ergocalciferol , (DRISDOL ) 1.25 MG (50000 UNIT) CAPS capsule   Pre-diabetes - Primary   Relevant Medications   Semaglutide -Weight Management (WEGOVY ) 1 MG/0.5ML SOAJ   metFORMIN  (GLUCOPHAGE ) 500 MG tablet   Obesity, Beginning BMI 43.79   Relevant Medications   Semaglutide -Weight Management (WEGOVY ) 1 MG/0.5ML SOAJ   metFORMIN  (GLUCOPHAGE ) 500 MG tablet   BMI 40.0-44.9, adult (HCC) Current BMI 42.8   Relevant Medications   Semaglutide -Weight Management (WEGOVY ) 1 MG/0.5ML SOAJ   metFORMIN  (GLUCOPHAGE ) 500 MG tablet   Hypercholesterolemia   Other Visit Diagnoses       Low serum vitamin B12       Relevant Medications   Cyanocobalamin  (VITAMIN B 12) 500 MCG TABS      Obesity Mia Parker is undergoing treatment for obesity with Wegovy  1 mg weekly since late January, resulting in a 4.2% reduction in total body weight and significant decrease in adipose mass with increased muscle mass. No side effects such as nausea, vomiting, diarrhea, constipation, changes in vision, difficulty swallowing, or mood changes have been reported. Her visceral adipose rating is now 13, with a goal of 12 or less. - Continue Wegovy  1 mg weekly for hyperlipidemia, medical weight loss and prediabetic state. - Encourage meeting protein needs and adequate hydration. - Monitor visceral adipose rating, aiming for a goal of 12  or less.   Hyperlipidemia LDL is not at goal. Medication(s): Wegovy  Cardiovascular risk factors: dyslipidemia and obesity (BMI >= 30 kg/m2) Lab Results  Component Value Date   CHOL 192 01/19/2024   CHOL 216 (H) 06/30/2023   CHOL 235 (H) 07/02/2022   Lab Results  Component Value Date   HDL 81 01/19/2024   HDL 91 06/30/2023   HDL 89 07/02/2022   Lab Results  Component Value Date   LDLCALC 100 (H) 01/19/2024   LDLCALC 113 (H)  06/30/2023   LDLCALC 129 (H) 07/02/2022   Lab Results  Component Value Date   TRIG 60 01/19/2024   TRIG 71 06/30/2023   TRIG 97 07/02/2022   Lab Results  Component Value Date   CHOLHDL 2.6 07/02/2022   CHOLHDL 2.4 02/22/2020   CHOLHDL 2.8 10/02/2017   No results found for: "LDLDIRECT"  Improvements seen in LDL overall cholesterol numbers since starting Wegovy .   The 10-year ASCVD risk score (Arnett DK, et al., 2019) is: 1.3%   Values used to calculate the score:     Age: 55 years     Sex: Female     Is Non-Hispanic African American: No     Diabetic: Yes     Tobacco smoker: No     Systolic Blood Pressure: 114 mmHg     Is BP treated: No     HDL Cholesterol: 81 mg/dL     Total Cholesterol: 192 mg/dL  Plan: Continue Wegovy  Continue to work on nutrition plan -decreasing simple carbohydrates, increasing lean proteins, decreasing saturated fats and cholesterol , avoiding trans fats and exercise as able to promote weight loss, improve lipids and decrease cardiovascular risks. Meds ordered this encounter  Medications   Semaglutide -Weight Management (WEGOVY ) 1 MG/0.5ML SOAJ    Sig: Inject 1 mg into the skin once a week.    Dispense:  2 mL    Refill:  0   Vitamin D , Ergocalciferol , (DRISDOL ) 1.25 MG (50000 UNIT) CAPS capsule    Sig: Take 1 capsule (50,000 Units total) by mouth every 7 (seven) days.    Dispense:  5 capsule    Refill:  0   buPROPion  (WELLBUTRIN  SR) 150 MG 12 hr tablet    Sig: Take 1 tablet (150 mg total) by mouth 2 (two) times daily.    Dispense:  60 tablet    Refill:  0   metFORMIN  (GLUCOPHAGE ) 500 MG tablet    Sig: Take 1 tablet (500 mg total) by mouth daily with breakfast.    Dispense:  30 tablet    Refill:  0   Cyanocobalamin  (VITAMIN B 12) 500 MCG TABS    Sig: Take 500 mcg by mouth daily at 6 (six) AM.    Dispense:  30 tablet    Refill:  2       Emotional eating behavior Mia Parker is on bupropion  150 mg twice a day for emotional eating behavior,  with good tolerance and no issues reported. - Continue/refill  bupropion  150 mg twice a day. X 30 days  Prediabetes Mia Parker is on metformin  500 mg daily for prediabetes, with good tolerance and no issues reported. Metformin  also aids in maintaining bowel function. Lab Results  Component Value Date   HGBA1C 5.7 (H) 01/19/2024   HGBA1C 5.9 (H) 06/30/2023   HGBA1C 5.7 (H) 01/27/2023   Lab Results  Component Value Date   LDLCALC 100 (H) 01/19/2024   CREATININE 0.86 01/19/2024   INSULIN   Date Value Ref Range Status  01/19/2024  11.4 2.6 - 24.9 uIU/mL Final  ]Continue working on nutrition plan to decrease simple carbohydrates, increase lean proteins and exercise to promote weight loss, improve glycemic control and prevent progression to Type 2 diabetes.  - Continue/refill metformin  500 mg daily.x 30 days.   Vitamin B12 deficiency Mia Parker is on metformin , which can inhibit B12 absorption. Despite a B12-rich diet, supplementation is recommended to prevent deficiency. Lab Results  Component Value Date   VITAMINB12 378 01/19/2024    - Recommend Vitamin B12 supplement, 500 micrograms daily, preferably sublingual.  Vitamin D  deficiency Mia Parker is on ergocalciferol  50,000 units once weekly for vitamin D  deficiency. Her vitamin D  level has improved to 38, but due to poor absorption, supplementation is necessary. - Continue ergocalciferol  50,000 units once weekly.x 30 days Low vitamin D  levels can be associated with adiposity and may result in leptin resistance and weight gain. Also associated with fatigue.  Currently on vitamin D  supplementation without any adverse effects such as nausea, vomiting or muscle weakness.    General Health Maintenance Cherron's cholesterol levels are improving, with LDL decreasing and HDL at 81. Triglycerides are at 60. Kidney and liver functions are normal. - Monitor cholesterol levels, aiming for continued improvement in LDL and maintenance of HDL. - Ensure  triglycerides remain below 150.  Follow-up Mia Parker has follow-up appointments scheduled for July 8th at 2 PM and August 6th at 9 AM. - Follow up on July 8th at 2 PM. - Schedule follow-up on August 6th at 9 AM. Vitals Temp: 98.3 F (36.8 C) BP: 114/75 Pulse Rate: 74 SpO2: 97 %   Anthropometric Measurements Height: 5\' 8"  (1.727 m) Weight: 276 lb (125.2 kg) BMI (Calculated): 41.98 Weight at Last Visit: 279 lb Weight Lost Since Last Visit: 3 lb Weight Gained Since Last Visit: 0 Starting Weight: 288 lb Total Weight Loss (lbs): 12 lb (5.443 kg) Peak Weight: 300 lb   Body Composition  Body Fat %: 41.3 % Fat Mass (lbs): 114 lbs Muscle Mass (lbs): 154 lbs Total Body Water (lbs): 95.2 lbs Visceral Fat Rating : 13   Other Clinical Data Fasting: yes Labs: no Today's Visit #: 26 Starting Date: 07/02/22     ASSESSMENT AND PLAN:  Diet: Mia Parker is currently in the action stage of change. As such, her goal is to continue with weight loss efforts. She has agreed to Category 3 Plan.  Exercise: Mia Parker has been instructed to work up to a goal of 150 minutes of combined cardio and strengthening exercise per week for weight loss and overall health benefits.   Behavior Modification:  We discussed the following Behavioral Modification Strategies today: increasing lean protein intake, decreasing simple carbohydrates, increasing vegetables, increase H2O intake, increase high fiber foods, no skipping meals, meal planning and cooking strategies, emotional eating strategies , avoiding temptations, and planning for success. We discussed various medication options to help Mia Parker with her weight loss efforts and we both agreed to continue current treatment plan, continue to work on nutritional and behavioral strategies to promote weight loss.  .  Return in about 4 weeks (around 03/22/2024).Mia Parker She was informed of the importance of frequent follow up visits to maximize her success with intensive  lifestyle modifications for her multiple health conditions.  Attestation Statements:   Reviewed by clinician on day of visit: allergies, medications, problem list, medical history, surgical history, family history, social history, and previous encounter notes.   Time spent on visit including pre-visit chart review and post-visit care and charting was 27 minutes.  Mia Arenz, PA-C

## 2024-02-23 ENCOUNTER — Ambulatory Visit (INDEPENDENT_AMBULATORY_CARE_PROVIDER_SITE_OTHER): Admitting: Physician Assistant

## 2024-02-23 ENCOUNTER — Encounter (INDEPENDENT_AMBULATORY_CARE_PROVIDER_SITE_OTHER): Payer: Self-pay | Admitting: Physician Assistant

## 2024-02-23 VITALS — BP 114/75 | HR 74 | Temp 98.3°F | Ht 68.0 in | Wt 276.0 lb

## 2024-02-23 DIAGNOSIS — E7800 Pure hypercholesterolemia, unspecified: Secondary | ICD-10-CM

## 2024-02-23 DIAGNOSIS — F3289 Other specified depressive episodes: Secondary | ICD-10-CM

## 2024-02-23 DIAGNOSIS — E538 Deficiency of other specified B group vitamins: Secondary | ICD-10-CM

## 2024-02-23 DIAGNOSIS — Z6841 Body Mass Index (BMI) 40.0 and over, adult: Secondary | ICD-10-CM

## 2024-02-23 DIAGNOSIS — E559 Vitamin D deficiency, unspecified: Secondary | ICD-10-CM | POA: Diagnosis not present

## 2024-02-23 DIAGNOSIS — F32A Depression, unspecified: Secondary | ICD-10-CM | POA: Diagnosis not present

## 2024-02-23 DIAGNOSIS — F5089 Other specified eating disorder: Secondary | ICD-10-CM | POA: Diagnosis not present

## 2024-02-23 DIAGNOSIS — R7303 Prediabetes: Secondary | ICD-10-CM | POA: Diagnosis not present

## 2024-02-23 DIAGNOSIS — E669 Obesity, unspecified: Secondary | ICD-10-CM

## 2024-02-23 MED ORDER — VITAMIN B 12 500 MCG PO TABS: 500.0000 ug | ORAL_TABLET | Freq: Every day | ORAL | 2 refills | Status: DC

## 2024-02-23 MED ORDER — VITAMIN D (ERGOCALCIFEROL) 1.25 MG (50000 UNIT) PO CAPS: 50000.0000 [IU] | ORAL_CAPSULE | ORAL | 0 refills | Status: DC

## 2024-02-23 MED ORDER — BUPROPION HCL ER (SR) 150 MG PO TB12: 150.0000 mg | ORAL_TABLET | Freq: Two times a day (BID) | ORAL | 0 refills | Status: DC

## 2024-02-23 MED ORDER — WEGOVY 1 MG/0.5ML ~~LOC~~ SOAJ: 1.0000 mg | SUBCUTANEOUS | 0 refills | Status: DC

## 2024-02-23 MED ORDER — METFORMIN HCL 500 MG PO TABS: 500.0000 mg | ORAL_TABLET | Freq: Every day | ORAL | 0 refills | Status: DC

## 2024-02-26 DIAGNOSIS — F4310 Post-traumatic stress disorder, unspecified: Secondary | ICD-10-CM | POA: Diagnosis not present

## 2024-03-11 DIAGNOSIS — F4310 Post-traumatic stress disorder, unspecified: Secondary | ICD-10-CM | POA: Diagnosis not present

## 2024-03-16 DIAGNOSIS — F4310 Post-traumatic stress disorder, unspecified: Secondary | ICD-10-CM | POA: Diagnosis not present

## 2024-03-22 ENCOUNTER — Encounter (INDEPENDENT_AMBULATORY_CARE_PROVIDER_SITE_OTHER): Payer: Self-pay | Admitting: Physician Assistant

## 2024-03-22 ENCOUNTER — Ambulatory Visit (INDEPENDENT_AMBULATORY_CARE_PROVIDER_SITE_OTHER): Admitting: Physician Assistant

## 2024-03-22 VITALS — BP 113/72 | HR 90 | Temp 98.4°F | Ht 68.0 in | Wt 273.0 lb

## 2024-03-22 DIAGNOSIS — E538 Deficiency of other specified B group vitamins: Secondary | ICD-10-CM | POA: Diagnosis not present

## 2024-03-22 DIAGNOSIS — F5089 Other specified eating disorder: Secondary | ICD-10-CM | POA: Diagnosis not present

## 2024-03-22 DIAGNOSIS — R7303 Prediabetes: Secondary | ICD-10-CM | POA: Diagnosis not present

## 2024-03-22 DIAGNOSIS — Z6841 Body Mass Index (BMI) 40.0 and over, adult: Secondary | ICD-10-CM | POA: Diagnosis not present

## 2024-03-22 DIAGNOSIS — E559 Vitamin D deficiency, unspecified: Secondary | ICD-10-CM | POA: Diagnosis not present

## 2024-03-22 DIAGNOSIS — F32A Depression, unspecified: Secondary | ICD-10-CM

## 2024-03-22 DIAGNOSIS — E7800 Pure hypercholesterolemia, unspecified: Secondary | ICD-10-CM

## 2024-03-22 DIAGNOSIS — F3289 Other specified depressive episodes: Secondary | ICD-10-CM

## 2024-03-22 DIAGNOSIS — E669 Obesity, unspecified: Secondary | ICD-10-CM

## 2024-03-22 MED ORDER — VITAMIN B 12 500 MCG PO TABS: 500.0000 ug | ORAL_TABLET | Freq: Every day | ORAL | 2 refills | Status: DC

## 2024-03-22 MED ORDER — WEGOVY 1 MG/0.5ML ~~LOC~~ SOAJ: 1.0000 mg | SUBCUTANEOUS | 0 refills | Status: DC

## 2024-03-22 MED ORDER — VITAMIN D (ERGOCALCIFEROL) 1.25 MG (50000 UNIT) PO CAPS
50000.0000 [IU] | ORAL_CAPSULE | ORAL | 0 refills | Status: DC
Start: 2024-03-22 — End: 2024-04-20

## 2024-03-22 NOTE — Progress Notes (Signed)
 SUBJECTIVE: Discussed the use of AI scribe software for clinical note transcription with the patient, who gave verbal consent to proceed.  Chief Complaint: Obesity  Interim History: She is down 3 lbs since her last visit.  Down 15 lbs overall TBW loss of 5.2% Mia Parker is here to discuss her progress with her obesity treatment plan. She is on the Category 3 Plan and states she is following her eating plan approximately 50 % of the time. She states she is walking  exercising 30-45 minutes 5 times per week.  Mia Parker is a 53 year old female who presents for follow-up of her obesity treatment plan.  She is currently on Wegovy  at a dose of 1 mg weekly and has lost 15 pounds. She experiences periods of not feeling hungry followed by intense hunger, which sometimes leads to poor food choices. She is working on maintaining consistent protein intake throughout the day to manage hunger better. We discussed the importance of regular meals and adequate protein intake for weight loss and overall health.   Her medical history includes hypercholesterolemia, prediabetes, vitamin D  deficiency, and emotional eating behavior. She is taking metformin  500 mg daily for prediabetes, bupropion  SR 150 mg once daily, ergocalciferol  50,000 units once weekly for vitamin D  deficiency, and vitamin B12 500 mcg daily for B12 deficiency.  She is active at the pool, walks for 45 minutes in the mornings, and plans to start swimming once her children's swim practice ends.  She is starting a new job at [employer], working night shifts from 6 PM to 2:30 AM, with the potential to work remotely after training. She plans to manage her schedule to ensure it does not interfere with her health goals.  No issues with Wegovy , such as constipation. Denies mass in neck, dysphagia, dyspepsia, persistent hoarseness, abdominal pain, or N/V/Constipation or diarrhea. Has annual eye exam. Mood is stable.   She is a good sleeper and  maintains adequate hydration. [place] challenges with maintaining her dietary goals due to traditional [place] cooking.   Pharmacotherapy: Wegovy  started 10/07/23                                     Wegovy  0.5 mg started 11/04/23    Wegovy  1 mg started ~ 01/19/24    Metformin  for prediabetes- stopped 03/22/24 - patient preference    Bupropion  - stopped 03/22/24- patient preference.  OBJECTIVE: Visit Diagnoses: Problem List Items Addressed This Visit     Depression   Vitamin D  deficiency   Relevant Medications   Vitamin D , Ergocalciferol , (DRISDOL ) 1.25 MG (50000 UNIT) CAPS capsule   Pre-diabetes   Relevant Medications   Semaglutide -Weight Management (WEGOVY ) 1 MG/0.5ML SOAJ   Obesity, Beginning BMI 43.79   Relevant Medications   Semaglutide -Weight Management (WEGOVY ) 1 MG/0.5ML SOAJ   BMI 40.0-44.9, adult (HCC) Current BMI 42.8   Relevant Medications   Semaglutide -Weight Management (WEGOVY ) 1 MG/0.5ML SOAJ   Hypercholesterolemia - Primary   Other Visit Diagnoses       Low serum vitamin B12       Relevant Medications   Cyanocobalamin  (VITAMIN B 12) 500 MCG TABS     Obesity Mia Parker is on Wegovy  1 mg weekly, resulting in a 15-pound weight loss with ~maintained muscle mass and reduced adipose tissue. She experiences alternating periods of anorexia and hyperphagia, leading  to poor dietary choices. She desires to reduce her medication burden, and it was agreed to discontinue bupropion  and metformin  for a trial period, with the option to increase Wegovy  dosage if needed. We discussed aiming for 100+ grams of protein daily- She thinks she is getting about 50 grams currently.  We discussed the importance of regular meals and adequate protein intake for weight loss and overall health.  - Continue/refill Wegovy  1 mg weekly - Increase protein intake to 100 grams per day and eat on a more regular schedule.  - Engage in regular  physical activity, including walking and swimming - Discontinue bupropion  and metformin  for a trial period and monitor response - Consider increasing Wegovy  dosage if cravings or hunger increase Meds ordered this encounter  Medications   Vitamin D , Ergocalciferol , (DRISDOL ) 1.25 MG (50000 UNIT) CAPS capsule    Sig: Take 1 capsule (50,000 Units total) by mouth every 7 (seven) days.    Dispense:  5 capsule    Refill:  0   Semaglutide -Weight Management (WEGOVY ) 1 MG/0.5ML SOAJ    Sig: Inject 1 mg into the skin once a week.    Dispense:  2 mL    Refill:  0   Cyanocobalamin  (VITAMIN B 12) 500 MCG TABS    Sig: Take 500 mcg by mouth daily at 6 (six) AM.    Dispense:  30 tablet    Refill:  2    Emotional Eating Behavior Mia Parker exhibits emotional eating behavior, particularly during episodes of hyperphagia. Emphasized the importance of regular protein intake to prevent intense hunger and poor dietary choices. She would like to go off of bupropion  at this point and has only been taking it once daily for the past month.  -discontinue bupropion  and monitor closely - Increase awareness of eating patterns and ensure regular protein intake  Prediabetes Mia Parker is on metformin  500 mg daily for prediabetes. A trial of discontinuing metformin  to evaluate if Wegovy  alone can manage her condition was agreed upon. Lab Results  Component Value Date   HGBA1C 5.7 (H) 01/19/2024   HGBA1C 5.9 (H) 06/30/2023   HGBA1C 5.7 (H) 01/27/2023   Lab Results  Component Value Date   LDLCALC 100 (H) 01/19/2024   CREATININE 0.86 01/19/2024    - Discontinue metformin  for one month and monitor closely - Consider resuming metformin  if cravings or hunger increase Consider increasing Wegovy  next visit.   Hyperlipidemia LDL is not at goal. HDL 81- very good. Trig 60- very good.  Medication(s): On Wegovy  1 mg weekly. No Statin therapy Cardiovascular risk factors: dyslipidemia and obesity (BMI >= 30 kg/m2)  Lab  Results  Component Value Date   CHOL 192 01/19/2024   HDL 81 01/19/2024   LDLCALC 100 (H) 01/19/2024   TRIG 60 01/19/2024   CHOLHDL 2.6 07/02/2022   CHOLHDL 2.4 02/22/2020   CHOLHDL 2.8 10/02/2017   Lab Results  Component Value Date   ALT 10 01/19/2024   AST 20 01/19/2024   ALKPHOS 111 01/19/2024   BILITOT 0.6 01/19/2024   The 10-year ASCVD risk score (Arnett DK, et al., 2019) is: 1.2%   Values used to calculate the score:     Age: 42 years     Clincally relevant sex: Female     Is Non-Hispanic African American: No     Diabetic: Yes     Tobacco smoker: No     Systolic Blood Pressure: 113 mmHg     Is BP treated: No     HDL  Cholesterol: 81 mg/dL     Total Cholesterol: 192 mg/dL  Plan: Continue Wegovy  1 mg weekly to lower CV risks.  Continue to work on nutrition plan -decreasing simple carbohydrates, increasing lean proteins, decreasing saturated fats and cholesterol , avoiding trans fats and exercise as able to promote weight loss, improve lipids and decrease cardiovascular risks.      Vitamin D  Deficiency Mia Parker is on ergocalciferol  50,000 units once weekly for vitamin D  deficiency. Continued supplementation is necessary.No N/V or muscle weakness with Ergocalciferol . She is getting daily sun exposure currently.  Last vitamin D  Lab Results  Component Value Date   VD25OH 38.4 01/19/2024   Low vitamin D  levels can be associated with adiposity and may result in leptin resistance and weight gain. Also associated with fatigue. She reports good sleep patterns and improved energy.  Currently on vitamin D  supplementation without any adverse effects such as nausea, vomiting or muscle weakness.  - Continue ergocalciferol  50,000 units once weekly  Vitamin B12 Deficiency Mia Parker is on vitamin B12 500 mcg daily for B12 deficiency. Continued supplementation is necessary. Lab Results  Component Value Date   VITAMINB12 378 01/19/2024   Plan: - Continue vitamin B12 500 mcg  daily  Vitals Temp: 98.4 F (36.9 C) BP: 113/72 Pulse Rate: 90 SpO2: 98 %   Anthropometric Measurements Height: 5' 8 (1.727 m) Weight: 273 lb (123.8 kg) BMI (Calculated): 41.52 Weight at Last Visit: 276 lb Weight Lost Since Last Visit: 3 lb Weight Gained Since Last Visit: 0 Starting Weight: 288 lb Total Weight Loss (lbs): 15 lb (6.804 kg)   Body Composition  Body Fat %: 41.7 % Fat Mass (lbs): 113.8 lbs Muscle Mass (lbs): 151.2 lbs Total Body Water (lbs): 96.6 lbs Visceral Fat Rating : 13   Other Clinical Data Fasting: No Labs: No Today's Visit #: 27 Starting Date: 07/02/22     ASSESSMENT AND PLAN:  Diet: Mia Parker is currently in the action stage of change. As such, her goal is to continue with weight loss efforts. She has agreed to Category 3 Plan.  Exercise: Mia Parker has been instructed to work up to a goal of 150 minutes of combined cardio and strengthening exercise per week for weight loss and overall health benefits.   Behavior Modification:  We discussed the following Behavioral Modification Strategies today: increasing lean protein intake, decreasing simple carbohydrates, increasing vegetables, increase H2O intake, increase high fiber foods, no skipping meals, meal planning and cooking strategies, emotional eating strategies , avoiding temptations, and planning for success. We discussed various medication options to help Mia Parker with her weight loss efforts and we both agreed to continue Wegovy  at current dose and trial off of bupropion  and metformin  for the next month.  Return in about 4 weeks (around 04/19/2024).SABRA She was informed of the importance of frequent follow up visits to maximize her success with intensive lifestyle modifications for her multiple health conditions.  Attestation Statements:   Reviewed by clinician on day of visit: allergies, medications, problem list, medical history, surgical history, family history, social history, and previous  encounter notes.   Time spent on visit including pre-visit chart review and post-visit care and charting was 29 minutes.    Alaila Pillard, PA-C

## 2024-03-29 DIAGNOSIS — F4310 Post-traumatic stress disorder, unspecified: Secondary | ICD-10-CM | POA: Diagnosis not present

## 2024-04-04 DIAGNOSIS — F4310 Post-traumatic stress disorder, unspecified: Secondary | ICD-10-CM | POA: Diagnosis not present

## 2024-04-13 DIAGNOSIS — F4310 Post-traumatic stress disorder, unspecified: Secondary | ICD-10-CM | POA: Diagnosis not present

## 2024-04-19 NOTE — Progress Notes (Unsigned)
 SUBJECTIVE: Discussed the use of AI scribe software for clinical note transcription with the patient, who gave verbal consent to proceed.  Chief Complaint: Obesity  Interim History: She has maintained her weight since last visit.  Down 15 lbs  TBW Loss 5.2%  Mia Parker is here to discuss her progress with her obesity treatment plan. She is on the Category 3 Plan and states she is following her eating plan approximately 50 % of the time. She states she is exercising 30 minutes 4 times per week.  NOTNAMED Mia Parker is a 53 year old female who presents for follow-up of her obesity treatment plan.  She has a history of hyperlipidemia, prediabetes, vitamin D  deficiency, low vitamin B12, and emotional eating behavior. She was recently started on Wegovy , currently taking 1 mg weekly for obesity and hyperlipidemia. Despite this, her weight remains unchanged since the last visit, although she has lost a total of fifteen pounds overall.  She has been working night shifts for the past week and a half, which has disrupted her routine and led to increased fatigue and decreased exercise. Her sleep schedule is irregular, and she often feels tired, impacting her ability to exercise and maintain a healthy diet. She describes feeling 'all out of whack' and 'horrible' during this period.  Her current diet includes nuts and water during her night shifts, with a [employer] energy drink at 12:30 AM to stay awake. She works for eight and a half hours and often does not eat during her shift. She experiences calf cramps. We discussed the importance of regular meals and adequate protein intake for weight loss and overall health.  She wakes up feeling ravenous and often opts for fast food, such as a chicken sandwich from [employer], instead of healthier options. She wants to improve her protein intake during her shifts. We discussed better high protein options and discussed some protein options to use while working at night.    Her current medications include ergocalciferol  50,000 units once weekly for vitamin D  deficiency and vitamin B12 500 micrograms daily.  OBJECTIVE: Visit Diagnoses: Problem List Items Addressed This Visit     Depression   Vitamin D  deficiency   Relevant Medications   Vitamin D , Ergocalciferol , (DRISDOL ) 1.25 MG (50000 UNIT) CAPS capsule   Pre-diabetes   Relevant Medications   semaglutide -weight management (WEGOVY ) 1 MG/0.5ML SOAJ SQ injection   Obesity, Beginning BMI 43.79   Relevant Medications   semaglutide -weight management (WEGOVY ) 1 MG/0.5ML SOAJ SQ injection   BMI 40.0-44.9, adult (HCC) Current BMI 42.8   Relevant Medications   semaglutide -weight management (WEGOVY ) 1 MG/0.5ML SOAJ SQ injection   Hypercholesterolemia - Primary   Other Visit Diagnoses       Low serum vitamin B12       Relevant Medications   Cyanocobalamin  (VITAMIN B 12) 500 MCG TABS     Obesity with emotional eating behavior/stress Obesity with emotional eating behavior, currently on Wegovy  1 mg weekly. Weight unchanged since last visit, with a total weight loss of 15 pounds.  Experiencing challenges with night shift work affecting sleep, exercise, and eating habits. Emotional eating behavior exacerbated by irregular sleep and work schedule. - Continue Wegovy  1 mg weekly - Encourage protein intake during night shifts, such as protein shakes or protein-rich snacks - Consider using a peddler for physical activity during sedentary work hours - Advise on healthier food choices post-shift to avoid high-calorie fast food - Discuss potential use of protein shots before bed to prevent excessive morning hunger-  [employer]ok[employer] protein shots with 10 grams of protein in 3 oz drink.  Meds ordered this encounter  Medications   Cyanocobalamin  (VITAMIN B 12) 500 MCG TABS    Sig: Take 500 mcg by mouth daily at 6 (six) AM.    Dispense:  30 tablet    Refill:  2   semaglutide -weight management (WEGOVY ) 1 MG/0.5ML SOAJ SQ  injection    Sig: Inject 1 mg into the skin once a week.    Dispense:  2 mL    Refill:  0   Vitamin D , Ergocalciferol , (DRISDOL ) 1.25 MG (50000 UNIT) CAPS capsule    Sig: Take 1 capsule (50,000 Units total) by mouth every 7 (seven) days.    Dispense:  5 capsule    Refill:  0    Hyperlipidemia LDL is not at goal. Medication(s): Wegovy  1 mg weekly Denies mass in neck, dysphagia, dyspepsia, persistent hoarseness, abdominal pain, or N/V/Constipation or diarrhea. Has annual eye exam. Mood is stable.   Cardiovascular risk factors: dyslipidemia, family history of premature cardiovascular disease, obesity (BMI >= 30 kg/m2), and sedentary lifestyle  Lab Results  Component Value Date   CHOL 192 01/19/2024   HDL 81 01/19/2024   LDLCALC 100 (H) 01/19/2024   TRIG 60 01/19/2024   CHOLHDL 2.6 07/02/2022   CHOLHDL 2.4 02/22/2020   CHOLHDL 2.8 10/02/2017   Lab Results  Component Value Date   ALT 10 01/19/2024   AST 20 01/19/2024   ALKPHOS 111 01/19/2024   BILITOT 0.6 01/19/2024   The 10-year ASCVD risk score (Arnett DK, et al., 2019) is: 1.1%   Values used to calculate the score:     Age: 69 years     Clincally relevant sex: Female     Is Non-Hispanic African American: No     Diabetic: Yes     Tobacco smoker: No     Systolic Blood Pressure: 107 mmHg     Is BP treated: No     HDL Cholesterol: 81 mg/dL     Total Cholesterol: 192 mg/dL  Plan: Continue Wegovy  1 mg weekly Continue to work on nutrition plan -decreasing simple carbohydrates, increasing lean proteins, decreasing saturated fats and cholesterol , avoiding trans fats and exercise as able to promote weight loss, improve lipids and decrease cardiovascular risks.  Prediabetes Last A1c was 5.7- improved, but not at goal. Insulin  11.4- improved, but not at goal.   Medication(s): Wegovy  1.0 mg SQ weekly Denies mass in neck, dysphagia, dyspepsia, persistent hoarseness, abdominal pain, or N/V/Constipation or diarrhea. Has annual eye  exam. Mood is stable.   Polyphagia:Yes Lab Results  Component Value Date   HGBA1C 5.7 (H) 01/19/2024   HGBA1C 5.9 (H) 06/30/2023   HGBA1C 5.7 (H) 01/27/2023   HGBA1C 5.8 (H) 07/02/2022   HGBA1C 5.7 (A) 02/22/2020   Lab Results  Component Value Date   INSULIN  11.4 01/19/2024   INSULIN  13.5 06/30/2023   INSULIN  11.5 01/27/2023   INSULIN  16.7 07/02/2022    Plan: Continue and refill Wegovy  1.0 mg SQ weekly Continue working on nutrition plan to decrease simple carbohydrates, increase lean proteins and exercise to promote weight loss, improve glycemic control and prevent progression to Type 2 diabetes.     Vitamin D  deficiency Vitamin D  deficiency managed with ergocalciferol  50,000 units once weekly. Last vitamin D  Lab Results  Component Value Date   VD25OH 38.4 01/19/2024   Low vitamin D  levels can be associated with adiposity and may result in leptin resistance and weight gain. Also  associated with fatigue.  Currently on vitamin D  supplementation without any adverse effects such as nausea, vomiting or muscle weakness.  - Continue/refill ergocalciferol  50,000 units once weekly  Vitamin B12 deficiency Vitamin B12 deficiency managed with vitamin B12 500 mcg tablets daily. She is very fatigued currently due to working nights . Feels very out of sorts with current schedule changes.  - Continu/refill vitamin B12 500 mcg tablets daily  Vitals Temp: 97.8 F (36.6 C) BP: 107/70 Pulse Rate: 72 SpO2: 97 %   Anthropometric Measurements Height: 5' 8 (1.727 m) Weight: 273 lb (123.8 kg) BMI (Calculated): 41.52 Weight at Last Visit: 273 lb Weight Lost Since Last Visit: 0 Weight Gained Since Last Visit: 0 Starting Weight: 288 lb Total Weight Loss (lbs): 15 lb (6.804 kg) Peak Weight: 300 lb   Body Composition  Body Fat %: 47.2 % Fat Mass (lbs): 129.2 lbs Muscle Mass (lbs): 137.4 lbs Total Body Water (lbs): 93.6 lbs Visceral Fat Rating : 15   Other Clinical  Data Fasting: no Labs: no Today's Visit #: 28 Starting Date: 07/02/22     ASSESSMENT AND PLAN:  Diet: Valerya is currently in the action stage of change. As such, her goal is to get back to weight loss efforts. She has agreed to Category 3 Plan.  Exercise: Paysley has been instructed that some exercise is better than none for weight loss and overall health benefits.   Behavior Modification:  We discussed the following Behavioral Modification Strategies today: increasing lean protein intake, decreasing simple carbohydrates, increasing vegetables, increase H2O intake, increase high fiber foods, no skipping meals, meal planning and cooking strategies, emotional eating strategies , avoiding temptations, and planning for success. We discussed various medication options to help Joei with her weight loss efforts and we both agreed to continue current treatment plan.  Return in about 4 weeks (around 05/18/2024).SABRA She was informed of the importance of frequent follow up visits to maximize her success with intensive lifestyle modifications for her multiple health conditions.  Attestation Statements:   Reviewed by clinician on day of visit: allergies, medications, problem list, medical history, surgical history, family history, social history, and previous encounter notes.   Time spent on visit including pre-visit chart review and post-visit care and charting was 40 minutes.    Christorpher Hisaw, PA-C

## 2024-04-20 ENCOUNTER — Ambulatory Visit (INDEPENDENT_AMBULATORY_CARE_PROVIDER_SITE_OTHER): Admitting: Physician Assistant

## 2024-04-20 ENCOUNTER — Encounter (INDEPENDENT_AMBULATORY_CARE_PROVIDER_SITE_OTHER): Payer: Self-pay | Admitting: Physician Assistant

## 2024-04-20 ENCOUNTER — Telehealth (INDEPENDENT_AMBULATORY_CARE_PROVIDER_SITE_OTHER): Payer: Self-pay | Admitting: *Deleted

## 2024-04-20 VITALS — BP 107/70 | HR 72 | Temp 97.8°F | Ht 68.0 in | Wt 273.0 lb

## 2024-04-20 DIAGNOSIS — E559 Vitamin D deficiency, unspecified: Secondary | ICD-10-CM

## 2024-04-20 DIAGNOSIS — E669 Obesity, unspecified: Secondary | ICD-10-CM

## 2024-04-20 DIAGNOSIS — Z6841 Body Mass Index (BMI) 40.0 and over, adult: Secondary | ICD-10-CM | POA: Diagnosis not present

## 2024-04-20 DIAGNOSIS — F439 Reaction to severe stress, unspecified: Secondary | ICD-10-CM | POA: Diagnosis not present

## 2024-04-20 DIAGNOSIS — F32A Depression, unspecified: Secondary | ICD-10-CM | POA: Diagnosis not present

## 2024-04-20 DIAGNOSIS — F5089 Other specified eating disorder: Secondary | ICD-10-CM

## 2024-04-20 DIAGNOSIS — F3289 Other specified depressive episodes: Secondary | ICD-10-CM

## 2024-04-20 DIAGNOSIS — R7303 Prediabetes: Secondary | ICD-10-CM | POA: Diagnosis not present

## 2024-04-20 DIAGNOSIS — E538 Deficiency of other specified B group vitamins: Secondary | ICD-10-CM

## 2024-04-20 DIAGNOSIS — E7800 Pure hypercholesterolemia, unspecified: Secondary | ICD-10-CM

## 2024-04-20 MED ORDER — VITAMIN B 12 500 MCG PO TABS: 500.0000 ug | ORAL_TABLET | Freq: Every day | ORAL | 2 refills | Status: DC

## 2024-04-20 MED ORDER — WEGOVY 1 MG/0.5ML ~~LOC~~ SOAJ: 1.0000 mg | SUBCUTANEOUS | 0 refills | Status: DC

## 2024-04-20 MED ORDER — VITAMIN D (ERGOCALCIFEROL) 1.25 MG (50000 UNIT) PO CAPS: 50000.0000 [IU] | ORAL_CAPSULE | ORAL | 0 refills | Status: DC

## 2024-04-20 NOTE — Telephone Encounter (Signed)
 Medication denied. Wegovy  was denied due to patient not losing 5% of her pre-medication weight. They require a loss of 5% patient, patient needed to have lost 14.5 lbs and she lost 13 lbs. Patient notified via Mychart message.

## 2024-04-20 NOTE — Telephone Encounter (Signed)
 PA SUBMITTED VIA [employer] FOR WEGOVY   Mia Parker (Key: [id number])  Your information has been sent to [facility]  Med[facility].

## 2024-05-12 DIAGNOSIS — F4310 Post-traumatic stress disorder, unspecified: Secondary | ICD-10-CM | POA: Diagnosis not present

## 2024-05-17 NOTE — Progress Notes (Unsigned)
 SUBJECTIVE: Discussed the use of AI scribe software for clinical note transcription with the patient, who gave verbal consent to proceed.  Chief Complaint: Obesity  Interim History: She has maintained her weight since her last visit.   Mia Parker is here to discuss her progress with her obesity treatment plan. She is on the Category 3 Plan and states she is following her eating plan approximately 60 % of the time. She states she is exercising walking 30 minutes 3 times per week.  Mia Parker is a 53 year old female who presents for follow-up of her obesity treatment plan.  She is adhering to a category three obesity treatment plan approximately sixty percent of the time and has maintained her weight since the last visit. She consumes whole foods, meets her protein and water needs, but struggles with skipping meals. She has not been on Wegovy  for a month due to insurance issues related to not achieving a five percent weight loss. Previously, metformin  was tried but did not help with hunger or appetite.  She sleeps seven to nine hours per night and engages in physical activity by walking for thirty minutes three days a week. Her schedule has stabilized with her children returning to school, allowing her to resume regular walking and she is getting used to working nights.   Her job involves data entry, requiring prolonged sitting. She manages hunger with snacks like pepperoni, low-calorie cheese, carrots, and hummus. She reports no significant cravings and maintains a structured eating schedule with protein shakes, yogurt, and eggs.  She continues to take vitamin D  and B12 supplements for her deficiencies. Her energy levels have improved since adjusting to the night shift, and she experiences no stress from her job. Pharmacotherapy: Wegovy  started 10/07/23                                     Wegovy  0.5 mg started 11/04/23                                     Wegovy  1 mg started ~ 01/19/24                                      Metformin -prediabetes- Dc- 03/22/24 - patient preference                                     Bupropion  - stopped 03/22/24- patient preference.  OBJECTIVE: Visit Diagnoses: Problem List Items Addressed This Visit     Depression   Vitamin D  deficiency   Relevant Medications   Vitamin D , Ergocalciferol , (DRISDOL ) 1.25 MG (50000 UNIT) CAPS capsule   Pre-diabetes   Obesity, Beginning BMI 43.79   BMI 40.0-44.9, adult (HCC) Current BMI 42.8   Hypercholesterolemia - Primary   Other Visit Diagnoses       Low serum vitamin B12       Relevant Medications   Cyanocobalamin  (VITAMIN B 12) 500 MCG TABS     Obesity with emotional eating behavior Weight has been maintained since the last visit. She is currently on a category three plan, following approximately 60% of the time, with difficulty skipping meals. No longer on Wegovy  due  to insurance requirements of 5% weight loss. Potential future availability of oral Wegovy , which may be less expensive, was discussed.  Discussion of potential use of phentermine, but concerns about side effects such as hypertension and anxiety. - Encourage continuation of current diet plan and increase adherence. - Encourage increased physical activity, including walking, chair yoga, and use of a peddler or weighted vest while walking to aid in building muscle mass. Reports no excessive hunger or cravings when eating on plan.  Working on strategies for emotional eating/cravings.  - Monitor for potential availability of oral Wegovy   in the future.  - She is planning to start some chair yoga classes at her local rec center and is also looking at some weight training options.   Hyperlipidemia LDL is not at goal. Medication(s): Wegovy  previously- stopped as insurance no longer covering Cardiovascular risk factors: dyslipidemia, obesity (BMI >= 30 kg/m2), and sedentary lifestyle  Lab Results  Component Value Date   CHOL 192 01/19/2024   HDL 81  01/19/2024   LDLCALC 100 (H) 01/19/2024   TRIG 60 01/19/2024   CHOLHDL 2.6 07/02/2022   CHOLHDL 2.4 02/22/2020   CHOLHDL 2.8 10/02/2017   Lab Results  Component Value Date   ALT 10 01/19/2024   AST 20 01/19/2024   ALKPHOS 111 01/19/2024   BILITOT 0.6 01/19/2024   The 10-year ASCVD risk score (Arnett DK, et al., 2019) is: 1.2%   Values used to calculate the score:     Age: 9 years     Clincally relevant sex: Female     Is Non-Hispanic African American: No     Diabetic: Yes     Tobacco smoker: No     Systolic Blood Pressure: 110 mmHg     Is BP treated: No     HDL Cholesterol: 81 mg/dL     Total Cholesterol: 192 mg/dL  Plan: Continue to work on Engineer, technical sales -decreasing simple carbohydrates, increasing lean proteins, decreasing saturated fats and cholesterol , avoiding trans fats and exercise as able to promote weight loss, improve lipids and decrease cardiovascular risks.  Prediabetes Last A1c was 5.7- improved, but not at goal. Insulin  11.4- improved, but not at goal  Medication(s): None- Wegovy - stopped as insurance not covering due to did not loose > 5%, but was the first medication which had truly been helpful with weight loss.  Polyphagia:No- not when eating on plan Lab Results  Component Value Date   HGBA1C 5.7 (H) 01/19/2024   HGBA1C 5.9 (H) 06/30/2023   HGBA1C 5.7 (H) 01/27/2023   HGBA1C 5.8 (H) 07/02/2022   HGBA1C 5.7 (A) 02/22/2020   Lab Results  Component Value Date   INSULIN  11.4 01/19/2024   INSULIN  13.5 06/30/2023   INSULIN  11.5 01/27/2023   INSULIN  16.7 07/02/2022    Plan:  Continue working on nutrition plan to decrease simple carbohydrates, increase lean proteins and exercise to promote weight loss, improve glycemic control and prevent progression to Type 2 diabetes.  Previous use of metformin  was not effective for hunger or appetite control and did not significantly impact A1c.   Vitamin D  Deficiency Vitamin D  is not at goal of 50.  Most recent  vitamin D  level was 38.4. She is on  prescription ergocalciferol  50,000 IU weekly. No N/V or muscle weakness with Ergocalciferol .  Lab Results  Component Value Date   VD25OH 38.4 01/19/2024   VD25OH 28.3 (L) 06/30/2023   VD25OH 44.1 01/27/2023    Plan: Continue and refill  prescription ergocalciferol  50,000 IU  weekly Low vitamin D  levels can be associated with adiposity and may result in leptin resistance and weight gain. Also associated with fatigue.  Currently on vitamin D  supplementation without any adverse effects such as nausea, vomiting or muscle weakness.  Recheck vitamin D  levels in 1-2 months.   Low B 12 On B 12 500 mcg daily Lab Results  Component Value Date   VITAMINB12 378 01/19/2024  Plan Continue B 12 500 mcg daily.  Reports energy levels are improved.  Recheck levels in 1-2 months.    Vitals Temp: 98 F (36.7 C) BP: 110/74 Pulse Rate: 78 SpO2: 98 %   Anthropometric Measurements Height: 5' 8 (1.727 m) Weight: 273 lb (123.8 kg) BMI (Calculated): 41.52 Weight at Last Visit: 273lb Weight Lost Since Last Visit: 0 Weight Gained Since Last Visit: 0 Starting Weight: 288lb Total Weight Loss (lbs): 15 lb (6.804 kg) Peak Weight: 300lb   Body Composition  Body Fat %: 47.2 % Fat Mass (lbs): 129.2 lbs Muscle Mass (lbs): 137.4 lbs Total Body Water (lbs): 92.6 lbs Visceral Fat Rating : 15   Other Clinical Data Fasting: no Labs: no Today's Visit #: 29 Starting Date: 07/02/22     ASSESSMENT AND PLAN:  Diet: Mia Parker is currently in the action stage of change. As such, her goal is to continue with weight loss efforts. She has agreed to Category 3 Plan.  Exercise: Mia Parker has been instructed to work up to a goal of 150 minutes of combined cardio and strengthening exercise per week for weight loss and overall health benefits.   Behavior Modification:  We discussed the following Behavioral Modification Strategies today: increasing lean protein intake,  decreasing simple carbohydrates, increasing vegetables, increase H2O intake, increase high fiber foods, no skipping meals, meal planning and cooking strategies, emotional eating strategies , celebration eating strategies, avoiding temptations, and planning for success. We discussed various medication options to help Mia Parker with her weight loss efforts and we both agreed to continue current treatment plan.  Return in about 4 weeks (around 06/15/2024).Mia Parker She was informed of the importance of frequent follow up visits to maximize her success with intensive lifestyle modifications for her multiple health conditions.  Attestation Statements:   Reviewed by clinician on day of visit: allergies, medications, problem list, medical history, surgical history, family history, social history, and previous encounter notes.   Time spent on visit including pre-visit chart review and post-visit care and charting was 29 minutes.    Mia Mcgraw, PA-C

## 2024-05-18 ENCOUNTER — Ambulatory Visit (INDEPENDENT_AMBULATORY_CARE_PROVIDER_SITE_OTHER): Admitting: Physician Assistant

## 2024-05-18 ENCOUNTER — Encounter (INDEPENDENT_AMBULATORY_CARE_PROVIDER_SITE_OTHER): Payer: Self-pay | Admitting: Physician Assistant

## 2024-05-18 VITALS — BP 110/74 | HR 78 | Temp 98.0°F | Ht 68.0 in | Wt 273.0 lb

## 2024-05-18 DIAGNOSIS — E669 Obesity, unspecified: Secondary | ICD-10-CM

## 2024-05-18 DIAGNOSIS — E7800 Pure hypercholesterolemia, unspecified: Secondary | ICD-10-CM

## 2024-05-18 DIAGNOSIS — F32A Depression, unspecified: Secondary | ICD-10-CM

## 2024-05-18 DIAGNOSIS — E538 Deficiency of other specified B group vitamins: Secondary | ICD-10-CM | POA: Diagnosis not present

## 2024-05-18 DIAGNOSIS — F5089 Other specified eating disorder: Secondary | ICD-10-CM

## 2024-05-18 DIAGNOSIS — E559 Vitamin D deficiency, unspecified: Secondary | ICD-10-CM

## 2024-05-18 DIAGNOSIS — Z6841 Body Mass Index (BMI) 40.0 and over, adult: Secondary | ICD-10-CM

## 2024-05-18 DIAGNOSIS — F3289 Other specified depressive episodes: Secondary | ICD-10-CM

## 2024-05-18 DIAGNOSIS — R7303 Prediabetes: Secondary | ICD-10-CM

## 2024-05-18 MED ORDER — VITAMIN B 12 500 MCG PO TABS: 500.0000 ug | ORAL_TABLET | Freq: Every day | ORAL | 2 refills | Status: DC

## 2024-05-18 MED ORDER — VITAMIN D (ERGOCALCIFEROL) 1.25 MG (50000 UNIT) PO CAPS: 50000.0000 [IU] | ORAL_CAPSULE | ORAL | 0 refills | Status: DC

## 2024-05-19 DIAGNOSIS — F4310 Post-traumatic stress disorder, unspecified: Secondary | ICD-10-CM | POA: Diagnosis not present

## 2024-05-27 DIAGNOSIS — F4310 Post-traumatic stress disorder, unspecified: Secondary | ICD-10-CM | POA: Diagnosis not present

## 2024-06-14 NOTE — Progress Notes (Signed)
 SUBJECTIVE: Discussed the use of AI scribe software for clinical note transcription with the patient, who gave verbal consent to proceed.  Chief Complaint: Obesity  Interim History: She is up 2 lbs since her last visit.  Down 13 lbs overall   Mia Parker is here to discuss her progress with her obesity treatment plan. She is on the Category 3 Plan and states she is following her eating plan approximately 20 % of the time. She states she is not exercising.  Mia Parker is a 53 year old female with obesity who presents for follow-up of her obesity treatment plan.  Since her last appointment, her husband broke his hand, requiring surgery and pins, which has impacted her daily routine and responsibilities. She is currently working over eight hours on nights, often returning home late and sleeping during the day while her children are at school. This schedule has disrupted her ability to exercise, and she feels she is 'white knuckling' through her current situation.  She has a history of prediabetes and hyperlipidemia. She previously tried Contrave  for weight management but experienced significant nausea and reported that it did not help. She has not tried topiramate before and has no history of kidney stones, seizure disorders, or glaucoma.  She has sleep apnea and uses a CPAP machine. She mentioned that she has been off her previous Wegovy , which helped with 'food noise,' and she is now experiencing increased cravings, particularly for sweets. She has been managing her cravings with protein bars and shakes, specifically mentioning 'Pure Protein' bars and 'Fairlife' protein shakes, which she orders online for convenience.  Her current medications include vitamin D  and B12 supplements, with no reported issues such as nausea, vomiting, or muscle weakness. She also consumes a caffeinated energy drink, 'Alani Nu,' nightly, which is carbonated and low in sugar.  Her social history includes a  demanding work schedule and responsibilities at home, especially with her husband's current inability to use his left hand. She is considering ways to incorporate exercise into her routine, possibly at a 24-hour gym, but is concerned about safety and time constraints. OBJECTIVE: Visit Diagnoses: Problem List Items Addressed This Visit     Depression   Relevant Medications   topiramate (TOPAMAX) 25 MG tablet   Vitamin D  deficiency   Relevant Medications   Vitamin D , Ergocalciferol , (DRISDOL ) 1.25 MG (50000 UNIT) CAPS capsule   Pre-diabetes   Polyphagia   Obesity, Beginning BMI 43.79   BMI 40.0-44.9, adult (HCC) Current BMI 42.8   Hypercholesterolemia - Primary   Other Visit Diagnoses       Low serum vitamin B12       Relevant Medications   Cyanocobalamin  (VITAMIN B 12) 500 MCG TABS      Obesity with polyphagia /cravings and emotional eating behavior Obesity with cravings and emotional eating behavior. Previous treatment with Contrave  was ineffective due to nausea. Considering new treatment options due to lack of insurance coverage for previous injectable medication. Discussed potential use of topiramate, an anticonvulsant that may help with cravings and weight loss. No contraindications such as kidney stones, seizure disorder, or glaucoma. Discussed potential side effects of topiramate, including tingling in extremities and altered taste of carbonated beverages. Emphasized the importance of addressing cravings, particularly for sweets, and discussed dietary strategies including protein bars and shakes. - Initiate topiramate 25 mg at bedtime - Monitor for side effects such as tingling in extremities and altered taste of carbonated beverages - Consider increasing topiramate dose if well tolerated and cravings  persist - Refill B12 and vitamin D  prescriptions - Discuss dietary strategies including protein bars and shakes Meds ordered this encounter  Medications   Cyanocobalamin  (VITAMIN  B 12) 500 MCG TABS    Sig: Take 500 mcg by mouth daily at 6 (six) AM.    Dispense:  30 tablet    Refill:  2   Vitamin D , Ergocalciferol , (DRISDOL ) 1.25 MG (50000 UNIT) CAPS capsule    Sig: Take 1 capsule (50,000 Units total) by mouth every 7 (seven) days.    Dispense:  5 capsule    Refill:  0   topiramate (TOPAMAX) 25 MG tablet    Sig: Take 1 tablet (25 mg total) by mouth daily.    Dispense:  30 tablet    Refill:  0    Obstructive sleep apnea Obstructive sleep apnea managed with CPAP. Discussed potential insurance coverage for weight loss medication due to sleep apnea diagnosis. Emphasized the importance of maintaining CPAP use to improve sleep quality. - Continue CPAP therapy - Investigate insurance coverage for weight loss medication related to sleep apnea- Zepbound  Vitamin D  deficiency Vitamin D  deficiency managed with supplementation. Ergocalciferol  50,000 unit once weekly. No N/V or muscle wealkness with Ergo.  Last vitamin D  Lab Results  Component Value Date   VD25OH 38.4 01/19/2024    - Continue vitamin D  supplementation- Ergocalciferol  50,000 units once weekly.  Low vitamin D  levels can be associated with adiposity and may result in leptin resistance and weight gain. Also associated with fatigue.  Currently on vitamin D  supplementation without any adverse effects such as nausea, vomiting or muscle weakness.   Vitals Temp: 97.9 F (36.6 C) BP: 102/70 Pulse Rate: 66 SpO2: 99 %   Anthropometric Measurements Height: 5' 8 (1.727 m) Weight: 275 lb (124.7 kg) BMI (Calculated): 41.82 Weight at Last Visit: 273 lb Weight Lost Since Last Visit: 0 Weight Gained Since Last Visit: 2 lb Starting Weight: 288 lb Total Weight Loss (lbs): 13 lb (5.897 kg) Peak Weight: 300 lb   Body Composition  Body Fat %: 48.1 % Fat Mass (lbs): 132.6 lbs Muscle Mass (lbs): 136 lbs Total Body Water (lbs): 94 lbs Visceral Fat Rating : 15   Other Clinical Data Fasting: No Labs:  No Today's Visit #: 30 Starting Date: 07/02/22     ASSESSMENT AND PLAN:  Diet: Mia Parker is currently in the action stage of change. As such, her goal is to continue with weight loss efforts. She has agreed to Category 3 Plan.  Exercise: Mia Parker has been instructed to work up to a goal of 150 minutes of combined cardio and strengthening exercise per week and that some exercise is better than none for weight loss and overall health benefits.   Behavior Modification:  We discussed the following Behavioral Modification Strategies today: increasing lean protein intake, decreasing simple carbohydrates, increasing vegetables, increase H2O intake, increase high fiber foods, meal planning and cooking strategies, emotional eating strategies , avoiding temptations, and planning for success. We discussed various medication options to help Mia Parker with her weight loss efforts and we both agreed to add topiramate for cravings/emotional eating behaviors.  Return in about 4 weeks (around 07/13/2024).Mia Parker She was informed of the importance of frequent follow up visits to maximize her success with intensive lifestyle modifications for her multiple health conditions.  Attestation Statements:   Reviewed by clinician on day of visit: allergies, medications, problem list, medical history, surgical history, family history, social history, and previous encounter notes.   Time spent on  visit including pre-visit chart review and post-visit care and charting was 23 minutes.    Karrina Lye, PA-C

## 2024-06-15 ENCOUNTER — Encounter (INDEPENDENT_AMBULATORY_CARE_PROVIDER_SITE_OTHER): Payer: Self-pay | Admitting: Physician Assistant

## 2024-06-15 ENCOUNTER — Ambulatory Visit (INDEPENDENT_AMBULATORY_CARE_PROVIDER_SITE_OTHER): Admitting: Physician Assistant

## 2024-06-15 VITALS — BP 102/70 | HR 66 | Temp 97.9°F | Ht 68.0 in | Wt 275.0 lb

## 2024-06-15 DIAGNOSIS — R7303 Prediabetes: Secondary | ICD-10-CM | POA: Diagnosis not present

## 2024-06-15 DIAGNOSIS — F32A Depression, unspecified: Secondary | ICD-10-CM

## 2024-06-15 DIAGNOSIS — Z6841 Body Mass Index (BMI) 40.0 and over, adult: Secondary | ICD-10-CM

## 2024-06-15 DIAGNOSIS — F3289 Other specified depressive episodes: Secondary | ICD-10-CM

## 2024-06-15 DIAGNOSIS — E7800 Pure hypercholesterolemia, unspecified: Secondary | ICD-10-CM

## 2024-06-15 DIAGNOSIS — E538 Deficiency of other specified B group vitamins: Secondary | ICD-10-CM

## 2024-06-15 DIAGNOSIS — E669 Obesity, unspecified: Secondary | ICD-10-CM

## 2024-06-15 DIAGNOSIS — E559 Vitamin D deficiency, unspecified: Secondary | ICD-10-CM | POA: Diagnosis not present

## 2024-06-15 DIAGNOSIS — R632 Polyphagia: Secondary | ICD-10-CM

## 2024-06-15 DIAGNOSIS — G4733 Obstructive sleep apnea (adult) (pediatric): Secondary | ICD-10-CM

## 2024-06-15 MED ORDER — VITAMIN B 12 500 MCG PO TABS: 500.0000 ug | ORAL_TABLET | Freq: Every day | ORAL | 2 refills | Status: AC

## 2024-06-15 MED ORDER — VITAMIN D (ERGOCALCIFEROL) 1.25 MG (50000 UNIT) PO CAPS: 50000.0000 [IU] | ORAL_CAPSULE | ORAL | 0 refills | Status: DC

## 2024-06-15 MED ORDER — TOPIRAMATE 25 MG PO TABS: 25.0000 mg | ORAL_TABLET | Freq: Every day | ORAL | 0 refills | Status: DC

## 2024-06-16 ENCOUNTER — Ambulatory Visit (HOSPITAL_COMMUNITY): Admission: EM | Admit: 2024-06-16 | Discharge: 2024-06-16 | Disposition: A | Discharge status: Home / Self Care

## 2024-06-16 ENCOUNTER — Encounter (HOSPITAL_COMMUNITY): Payer: Self-pay | Admitting: Emergency Medicine

## 2024-06-16 ENCOUNTER — Ambulatory Visit (INDEPENDENT_AMBULATORY_CARE_PROVIDER_SITE_OTHER)

## 2024-06-16 DIAGNOSIS — M25512 Pain in left shoulder: Secondary | ICD-10-CM | POA: Diagnosis not present

## 2024-06-16 MED ORDER — CELECOXIB 200 MG PO CAPS: 200.0000 mg | ORAL_CAPSULE | Freq: Two times a day (BID) | ORAL | 0 refills | Status: AC

## 2024-06-16 NOTE — ED Triage Notes (Signed)
 Pt reports left shoulder pain x 1 week. States she woke up one day and noticed the shoulder pain. Described the pain as tender and sharp when trying to move the arm. Denies any obvious injuries. Has tried Baclofen  with no relief.

## 2024-06-16 NOTE — ED Provider Notes (Signed)
 PCP: Joyce Norleen BROCKS, MD Chief Complaint: Shoulder Pain  Subjective:   HPI: Patient is a 53 y.o. female here for left shoulder pain.  Patient states that her shoulder has been hurting for the past week.  She states that she just woke up with left shoulder pain towards the front and top.  She denies any injuries, trauma or fall or change in activity.  She denies any specific event that could have caused intense pain.  This pain will wake her up at night.  She denies any tingling or numbness.  She has been taking muscle relaxers and Celebrex which does take the edge off.   Past Medical History:  Diagnosis Date   ADD (attention deficit disorder)    AMA (advanced maternal age) multigravida 35+    Anxiety    Back pain    Depression    Diabetes mellitus without complication (HCC)    GDM with 2017 pregnancy   Hypertension    with 2017 pregnancy, no meds   Infertility, female    Joint pain    Obesity    Osteoporosis    Postpartum edema 06/10/2013   Sleep apnea    use CPAP nightly    No current facility-administered medications on file prior to encounter.   Current Outpatient Medications on File Prior to Encounter  Medication Sig Dispense Refill   baclofen  (LIORESAL ) 10 MG tablet TAKE 0.5-1 TABLETS (5-10 MG TOTAL) BY MOUTH AT BEDTIME AS NEEDED FOR MUSCLE SPASMS. 90 tablet 1   Cyanocobalamin  (VITAMIN B 12) 500 MCG TABS Take 500 mcg by mouth daily at 6 (six) AM. 30 tablet 2   topiramate (TOPAMAX) 25 MG tablet Take 1 tablet (25 mg total) by mouth daily. 30 tablet 0   UNABLE TO FIND Take 1 capsule by mouth every 7 (seven) days.     Vitamin D , Ergocalciferol , (DRISDOL ) 1.25 MG (50000 UNIT) CAPS capsule Take 1 capsule (50,000 Units total) by mouth every 7 (seven) days. 5 capsule 0    BP 129/81 (BP Location: Right Arm)   Pulse 79   Temp 97.9 F (36.6 C) (Oral)   Resp 18   LMP 03/15/2024 (Approximate)   SpO2 98%        Objective:   Gen: Well developed, well nourished female in no  acute distress. HEENT: Pupils equal, round, and reactive to light.  Conjunctiva non-injected.  Nares patent without discharge.  Oral mucosa is moist and pink.   Lungs: Respiratory effort MSK: Left shoulder tender to palpate over the clavicle as well as the trapezius, shoulder flexion is limited to about 100 degrees due to pain, limited to abduction to about 90 degrees due to pain, positive empty can test on the left, weakness with external rotation, negative drop arm test, negative Yergason's, positive Hawkins, pain with abduction and external rotation Ext: No cyanosis, clubbing, or edema.  Assessment/Plan:   AINHOA RALLO is a 53 y.o. female who was seen today for the following: 1. Pain in joint of left shoulder (Primary) 2. Acute pain of left shoulder - DG Shoulder Left; Standing - DG Shoulder Left - X-ray significant for calcific tendinopathy, mild arthritis - While this may be cause for concern for impingement, she does have weakness with rotator cuff testing - Will send her to orthopedics for further evaluation - She can continue baclofen  - Will refill her Celebrex 200 mg twice daily with food  Follow-up/Education:   May return sooner as needed and encouraged to call/e-mail for additional questions or  worsening symptoms in the interim.  Krystal Lowing, DO Sports Medicine Fellow 06/16/2024 10:35 AM  Disclaimer: This transcription was electronically signed. It was transcribed by [place] and may contain errors in the text that were not recognized on proofreading.     Lowing Krystal HERO, DO 06/16/24 1036

## 2024-07-17 NOTE — Progress Notes (Unsigned)
 SUBJECTIVE: Discussed the use of AI scribe software for clinical note transcription with the patient, who gave verbal consent to proceed.  Chief Complaint: Obesity  Interim History: She is up 4 lbs since her last visit.  Down 9 lbs overall   Mia Parker is here to discuss her progress with her obesity treatment plan. She is on the Category 3 Plan and states she is following her eating plan approximately 30 % of the time. She states she is exercising walking/elliptical 30-45 minutes 5 times per week.  Mia Parker is a 53 year old female with obesity who presents for follow-up of her obesity treatment plan.  She is adhering to her category three obesity treatment plan approximately thirty percent of the time. She believes she is getting adequate protein and water intake but admits to skipping meals. She sleeps seven to nine hours per night and engages in physical activity, including walking or using the elliptical for thirty to forty-five minutes, five days per week.  She has recently joined a gym and noticed an increase in muscle mass. Her routine involves waking up at 10:30 AM, going to the gym, and then having a protein shake and yogurt. She starts work at 6 PM and typically consumes pumpkin seeds, nuts, yogurt, and another protein shake at work. She acknowledges not eating 'any real food' and sometimes indulges in chips.  Her current medications include topiramate, which she takes at night, vitamin B12, and vitamin D . She has completed a course of baclofen  and is not currently taking Celebrex. No issues with her vitamin supplements.  She has a history of using a CPAP machine for sleep apnea and reports using it regularly. She also mentions having one menstrual cycle per year, typically without warning, and has previously experienced hot flashes, which have since resolved. Sees GYN over next couple of months.   Her husband is currently at home recovering from a hand injury and is undergoing  physical therapy.  Plan Labs and fasting IC at [facility] in Jan. 2026  Pharmacotherapy: Wegovy  started 10/07/23                                     Wegovy  0.5 mg started 11/04/23                                     Wegovy  1 mg started ~ 01/19/24                                     Metformin -prediabetes- Dc- 03/22/24 - patient preference                                     Bupropion  - stopped 03/22/24- patient preference.   OBJECTIVE: Visit Diagnoses: Problem List Items Addressed This Visit     Depression   Relevant Medications   topiramate (TOPAMAX) 25 MG tablet   Vitamin D  deficiency   Relevant Medications   Vitamin D , Ergocalciferol , (DRISDOL ) 1.25 MG (50000 UNIT) CAPS capsule   Pre-diabetes   Polyphagia - Primary   Obesity, Beginning BMI 43.79   Obesity with polyphagia Management is ongoing with a focus on dietary intake and exercise. She is  on a category three plan but adheres to it only 30% of the time. She reports adequate protein and water intake but skips meals. Engages in 30-45 minutes of walking or elliptical exercise five days a week. Recent muscle gain noted, but adipose tissue remains a concern. Previous use of Wegovy  was beneficial, but it is no longer available.  Current use of topiramate is at 25 mg, with consideration to increase to 50 mg to address cravings/emotional eating Discussed the importance of incorporating real food into her diet to meet caloric and protein needs, as under-eating may lead to metabolic conservation and adipose storage. We discussed the importance of regular meals and adequate protein intake for weight loss and overall health. Consideration of phentermine as an alternative stimulant medication was discussed, but previous trials were ineffective. Discussed the potential impact of postmenopausal state on weight management. Polyphagia is fair to moderately controlled.  - Increased topiramate to 50 mg daily. - Encouraged incorporation of real food into diet, such  as meals from [employer] or similar services. - Scheduled fasting metabolic test for January 5th at 9:00 AM, with arrival at 8:30 AM. - Continue current exercise regimen.   Prediabetes Last A1c was 5.7- not at goal.   Medication(s): None- Previously on metformin  but did not improve A1c  and did not promote weight loss.     On Wegovy  and had weight loss but not on long enough to see if helped with A1c.  Polyphagia:Yes- at times Lab Results  Component Value Date   HGBA1C 5.7 (H) 01/19/2024   HGBA1C 5.9 (H) 06/30/2023   HGBA1C 5.7 (H) 01/27/2023   HGBA1C 5.8 (H) 07/02/2022   HGBA1C 5.7 (A) 02/22/2020   Lab Results  Component Value Date   INSULIN  11.4 01/19/2024   INSULIN  13.5 06/30/2023   INSULIN  11.5 01/27/2023   INSULIN  16.7 07/02/2022    Plan:  Previously on Wegovy  with good result with weight loss, but lost insurance coverage.  Could consider starting metformin  again for prediabetes management off label but was not clinically very successful previously.   Eating disorder/emotional eating Susi has had issues with stress/emotional eating. Currently this is fair to moderately controlled. Overall mood is stable. Medication(s): Topiramate 25 mg daily No reported SE.   Plan: Continue and increase dose Topiramate 50 mg daily.  Continue emotional eating stratgies.  Meds ordered this encounter  Medications   topiramate (TOPAMAX) 25 MG tablet    Sig: Take 2 tablets (50 mg total) by mouth daily.    Dispense:  60 tablet    Refill:  0   Vitamin D , Ergocalciferol , (DRISDOL ) 1.25 MG (50000 UNIT) CAPS capsule    Sig: Take 1 capsule (50,000 Units total) by mouth every 7 (seven) days.    Dispense:  5 capsule    Refill:  0    Vitamin D  Deficiency Vitamin D  is not at goal of 50.  Most recent vitamin D  level was 38.4. She is on  prescription ergocalciferol  50,000 IU weekly. No N/V or muscle weakness with Ergocalciferol   Lab Results  Component Value Date   VD25OH 38.4  01/19/2024   VD25OH 28.3 (L) 06/30/2023   VD25OH 44.1 01/27/2023    Plan: Continue and refill  prescription ergocalciferol  50,000 IU weekly Low vitamin D  levels can be associated with adiposity and may result in leptin resistance and weight gain. Also associated with fatigue.  Currently on vitamin D  supplementation without any adverse effects such as nausea, vomiting or muscle weakness.   Vitals  Temp: 98.3 F (36.8 C) BP: 117/74 Pulse Rate: 73 SpO2: 98 %   Anthropometric Measurements Height: 5' 8 (1.727 m) Weight: 279 lb (126.6 kg) BMI (Calculated): 42.43 Weight at Last Visit: 275 lb Weight Lost Since Last Visit: 0 Weight Gained Since Last Visit: 4 lb Starting Weight: 288 lb Total Weight Loss (lbs): 9 lb (4.082 kg) Peak Weight: 300 lb   Body Composition  Body Fat %: 48.2 % Fat Mass (lbs): 134.4 lbs Muscle Mass (lbs): 137.4 lbs Total Body Water (lbs): 93.4 lbs Visceral Fat Rating : 15   Other Clinical Data Fasting: No Labs: No Today's Visit #: 31 Starting Date: 07/02/22     ASSESSMENT AND PLAN:  Diet: Trinita is currently in the action stage of change. As such, her goal is to continue with weight loss efforts. She has agreed to Category 3 Plan.  Exercise: Halimah has been instructed to work up to a goal of 150 minutes of combined cardio and strengthening exercise per week for weight loss and overall health benefits.   Behavior Modification:  We discussed the following Behavioral Modification Strategies today: increasing lean protein intake, decreasing simple carbohydrates, increasing vegetables, increase H2O intake, increase high fiber foods, no skipping meals, meal planning and cooking strategies, emotional eating strategies , holiday eating strategies, avoiding temptations, and planning for success. We discussed various medication options to help Anisah with her weight loss efforts and we both agreed to continue current treatment plan and increase topiramate  to 50 mg daily for cravings/emotional eating.  Return in about 4 weeks (around 08/15/2024).SABRA She was informed of the importance of frequent follow up visits to maximize her success with intensive lifestyle modifications for her multiple health conditions.  Attestation Statements:   Reviewed by clinician on day of visit: allergies, medications, problem list, medical history, surgical history, family history, social history, and previous encounter notes.   Time spent on visit including pre-visit chart review and post-visit care and charting was 37 minutes.    Avishai Reihl, PA-C

## 2024-07-18 ENCOUNTER — Ambulatory Visit (INDEPENDENT_AMBULATORY_CARE_PROVIDER_SITE_OTHER): Payer: Self-pay | Admitting: Physician Assistant

## 2024-07-18 ENCOUNTER — Encounter (INDEPENDENT_AMBULATORY_CARE_PROVIDER_SITE_OTHER): Payer: Self-pay | Admitting: Physician Assistant

## 2024-07-18 VITALS — BP 117/74 | HR 73 | Temp 98.3°F | Ht 68.0 in | Wt 279.0 lb

## 2024-07-18 DIAGNOSIS — R632 Polyphagia: Secondary | ICD-10-CM

## 2024-07-18 DIAGNOSIS — E669 Obesity, unspecified: Secondary | ICD-10-CM | POA: Diagnosis not present

## 2024-07-18 DIAGNOSIS — E559 Vitamin D deficiency, unspecified: Secondary | ICD-10-CM

## 2024-07-18 DIAGNOSIS — F3289 Other specified depressive episodes: Secondary | ICD-10-CM

## 2024-07-18 DIAGNOSIS — F32A Depression, unspecified: Secondary | ICD-10-CM | POA: Diagnosis not present

## 2024-07-18 DIAGNOSIS — Z6841 Body Mass Index (BMI) 40.0 and over, adult: Secondary | ICD-10-CM | POA: Diagnosis not present

## 2024-07-18 DIAGNOSIS — R7303 Prediabetes: Secondary | ICD-10-CM | POA: Diagnosis not present

## 2024-07-18 DIAGNOSIS — E7800 Pure hypercholesterolemia, unspecified: Secondary | ICD-10-CM

## 2024-07-18 MED ORDER — VITAMIN D (ERGOCALCIFEROL) 1.25 MG (50000 UNIT) PO CAPS: 50000.0000 [IU] | ORAL_CAPSULE | ORAL | 0 refills | Status: AC

## 2024-07-18 MED ORDER — TOPIRAMATE 25 MG PO TABS: 50.0000 mg | ORAL_TABLET | Freq: Every day | ORAL | 0 refills | Status: AC

## 2024-08-15 ENCOUNTER — Ambulatory Visit (INDEPENDENT_AMBULATORY_CARE_PROVIDER_SITE_OTHER): Payer: Self-pay | Admitting: Physician Assistant

## 2024-08-17 ENCOUNTER — Encounter: Payer: Self-pay | Admitting: Family Medicine

## 2024-08-26 DIAGNOSIS — F4322 Adjustment disorder with anxiety: Secondary | ICD-10-CM | POA: Diagnosis not present

## 2024-09-19 ENCOUNTER — Ambulatory Visit (INDEPENDENT_AMBULATORY_CARE_PROVIDER_SITE_OTHER): Admitting: Physician Assistant

## 2024-09-19 ENCOUNTER — Other Ambulatory Visit: Payer: Self-pay

## 2024-09-19 ENCOUNTER — Ambulatory Visit
Admission: EM | Admit: 2024-09-19 | Discharge: 2024-09-19 | Disposition: A | Discharge status: Home / Self Care | Attending: Physician Assistant | Admitting: Physician Assistant

## 2024-09-19 DIAGNOSIS — J209 Acute bronchitis, unspecified: Secondary | ICD-10-CM

## 2024-09-19 MED ORDER — PREDNISONE 20 MG PO TABS: 40.0000 mg | ORAL_TABLET | Freq: Every day | ORAL | 0 refills | Status: AC

## 2024-09-19 MED ORDER — AZITHROMYCIN 250 MG PO TABS: ORAL_TABLET | ORAL | 0 refills | Status: DC

## 2024-09-19 MED ORDER — IPRATROPIUM-ALBUTEROL 0.5-2.5 (3) MG/3ML IN SOLN
3.0000 mL | Freq: Once | RESPIRATORY_TRACT | Status: AC
  Administered 2024-09-19: 3 mL via RESPIRATORY_TRACT

## 2024-09-19 NOTE — ED Notes (Signed)
 Pt is feeling better following DUONEB treatment.

## 2024-09-19 NOTE — Discharge Instructions (Addendum)
 You were seen today for concerns of a persistent cough as well as nasal congestion and fatigue.  Based on your symptoms as well as the chronicity I suspect you may have an acute case of bronchitis.  This basically means that the airways in your lungs are inflamed which can cause coughing and shortness of breath.  To assist with your symptoms I am sending you home with a medication called azithromycin , also known as a Z-Pak as well as a steroid burst of prednisone .  These medications should help reduce the inflammation in your airways and help you breathe a little bit easier and hopefully help reduce your coughing.  You can continue over-the-counter medications as needed for supportive care.  If you start to have more severe symptoms such as difficulty breathing, fevers not responding to Tylenol  or ibuprofen , confusion or loss of consciousness please go to the emergency room.

## 2024-09-19 NOTE — ED Triage Notes (Addendum)
 Pt presents to urgent care today for a work note. Needs one to return. States she is still experiencing a productive cough, congestion, and fatigue. Has been sick for approximately 10 days. No fevers. Denies pain at this time. OTC Mucinex + Advil  Cold & Sinus taken for sxs with improvement. Reports she is feeling much better today.   Appears SOB in triage room at this time. 98% O2 room air.

## 2024-09-19 NOTE — ED Provider Notes (Signed)
 " [facility]    CSN: [id number] Arrival date & time: 09/19/24  1748      History   Chief Complaint Chief Complaint  Patient presents with   Letter for School/Work   Shortness of Breath    HPI Mia Parker is a 54 y.o. female.   HPI  Pt is here today with concerns for bodyaches, coughing, chills, nasal congestion. She states she thinks she may have had the flu and symptoms have been ongoing for about 10 days. She has been taking OTC mucinex, advil  cold and flu for symptoms which is helping. She reports that she is feeling better but is still having coughing, congestion and some fatigue.  She reports she was a previous smoker    Past Medical History:  Diagnosis Date   ADD (attention deficit disorder)    AMA (advanced maternal age) multigravida 35+    Anxiety    Back pain    Depression    Diabetes mellitus without complication (HCC)    GDM with 2017 pregnancy   Hypertension    with 2017 pregnancy, no meds   Infertility, female    Joint pain    Obesity    Osteoporosis    Postpartum edema 06/10/2013   Sleep apnea    use CPAP nightly    Patient Active Problem List   Diagnosis Date Noted   Well woman exam with routine gynecological exam 07/01/2023   Vasomotor symptoms due to menopause 07/01/2023   Hypercholesterolemia 06/30/2023   BMI 40.0-44.9, adult (HCC) Current BMI 42.8 01/27/2023   Obesity, Beginning BMI 43.79 10/27/2022   Insulin  resistance 09/24/2022   Polyphagia 08/18/2022   Spinal stenosis of lumbar region 07/15/2022   Vitamin D  deficiency 07/15/2022   Pre-diabetes 07/15/2022   Other fatigue 07/02/2022   SOBOE (shortness of breath on exertion) 07/02/2022   Attention deficit disorder 07/02/2022   OSA on CPAP 07/02/2022   Lumbar spondylosis 07/02/2022   Depression 07/02/2022   History of postpartum depression 02/22/2020   History of gestational diabetes 02/22/2020   Glucose intolerance 02/22/2020   Allergic eosinophilia 10/11/2015    Morbid obesity due to excess calories (HCC) 09/14/2015   ADHD (attention deficit hyperactivity disorder) 07/08/2011    Past Surgical History:  Procedure Laterality Date   CESAREAN SECTION N/A 06/07/2013   Procedure: Primary Cesarean Section Delivery Baby  Boy @ 2317, Apgars   ;  Surgeon: Charlie JINNY Flowers, MD;  Location: [facility];  Service: Obstetrics;  Laterality: N/A;   CESAREAN SECTION N/A 05/23/2016   Procedure: CESAREAN SECTION;  Surgeon: Charlie Flowers, MD;  Location: [facility]TH[facility] SU[facility];  Service: Obstetrics;  Laterality: N/A;   KNEE CARTILAGE SURGERY Right    LEEP     TUBAL LIGATION Bilateral 05/23/2016   Procedure: BILATERAL TUBAL LIGATION;  Surgeon: Charlie Flowers, MD;  Location: [facility] BIRTHING SUIT[facility];  Service: Obstetrics;  Laterality: Bilateral;   WISDOM TOOTH EXTRACTION      OB History     Gravida  3   Para  2   Term  2   Preterm  0   AB  1   Living  2      SAB  0   IAB  1   Ectopic  0   Multiple  0   Live Births  2            Home Medications    Prior to Admission medications  Medication Sig Start Date End Date Taking? Authorizing Provider  azithromycin  (  ZITHROMAX ) 250 MG tablet Take 500mg  PO daily x1d and then 250mg  daily x4 days 09/19/24  Yes Dameion Briles E, PA-C  predniSONE  (DELTASONE ) 20 MG tablet Take 2 tablets (40 mg total) by mouth daily for 5 days. 09/19/24 09/24/24 Yes Kaydee Magel E, PA-C  celecoxib  (CELEBREX ) 200 MG capsule Take 1 capsule (200 mg total) by mouth 2 (two) times daily. 06/16/24   Jilda Krystal HERO, DO  Cyanocobalamin  (VITAMIN B 12) 500 MCG TABS Take 500 mcg by mouth daily at 6 (six) AM. 06/15/24   Rayburn, Elouise Phlegm, PA-C  topiramate  (TOPAMAX ) 25 MG tablet Take 2 tablets (50 mg total) by mouth daily. 07/18/24   Rayburn, Elouise Phlegm, PA-C  UNABLE TO FIND Take 1 capsule by mouth every 7 (seven) days. 09/23/22   [provider]  Vitamin D , Ergocalciferol , (DRISDOL ) 1.25 MG (50000 UNIT) CAPS capsule Take 1 capsule (50,000 Units  total) by mouth every 7 (seven) days. 07/18/24   Rayburn, Elouise Phlegm, PA-C    Family History Family History  Problem Relation Age of Onset   Hyperlipidemia Mother    Hypertension Mother    Hyperlipidemia Father    Diabetes Father    Hypertension Father    Heart attack Father    Heart disease Father    Breast cancer Neg Hx    Ovarian cancer Neg Hx     Social History Social History[1]   Allergies   Hydrocodone -acetaminophen  and Hydrocodone    Review of Systems Review of Systems  Constitutional:  Positive for chills. Negative for fever.  HENT:  Positive for congestion, postnasal drip, rhinorrhea and sore throat. Negative for ear pain.   Respiratory:  Positive for cough and shortness of breath.   Gastrointestinal:  Positive for diarrhea (resolving). Negative for nausea and vomiting.  Musculoskeletal:  Positive for myalgias.     Physical Exam Triage Vital Signs ED Triage Vitals  Encounter Vitals Group     BP 09/19/24 1949 117/87     Girls Systolic BP Percentile --      Girls Diastolic BP Percentile --      Boys Systolic BP Percentile --      Boys Diastolic BP Percentile --      Pulse Rate 09/19/24 1949 95     Resp 09/19/24 1949 20     Temp 09/19/24 1949 97.8 F (36.6 C)     Temp Source 09/19/24 1949 Oral     SpO2 09/19/24 1949 98 %     Weight --      Height --      Head Circumference --      Peak Flow --      Pain Score 09/19/24 1948 0     Pain Loc --      Pain Education --      Exclude from Growth Chart --    No data found.  Updated Vital Signs BP 117/87 (BP Location: Right Arm)   Pulse 95   Temp 97.8 F (36.6 C) (Oral)   Resp 20   SpO2 98%   Visual Acuity Right Eye Distance:   Left Eye Distance:   Bilateral Distance:    Right Eye Near:   Left Eye Near:    Bilateral Near:     Physical Exam Vitals reviewed.  Constitutional:      General: She is awake.     Appearance: Normal appearance. She is well-developed and well-groomed.  HENT:      Head: Normocephalic and atraumatic.     Right  Ear: Hearing, tympanic membrane and ear canal normal.     Left Ear: Hearing, tympanic membrane and ear canal normal.     Mouth/Throat:     Lips: Pink.     Mouth: Mucous membranes are moist.     Pharynx: Oropharynx is clear. Uvula midline. No pharyngeal swelling, oropharyngeal exudate, posterior oropharyngeal erythema, uvula swelling or postnasal drip.  Cardiovascular:     Rate and Rhythm: Normal rate and regular rhythm.     Pulses: Normal pulses.          Radial pulses are 2+ on the right side and 2+ on the left side.     Heart sounds: Normal heart sounds. No murmur heard.    No friction rub. No gallop.  Pulmonary:     Effort: Pulmonary effort is normal.     Breath sounds: Normal breath sounds. Decreased air movement present. No decreased breath sounds, wheezing, rhonchi or rales.  Musculoskeletal:     Cervical back: Normal range of motion and neck supple.  Lymphadenopathy:     Head:     Right side of head: No submental, submandibular or preauricular adenopathy.     Left side of head: No submental, submandibular or preauricular adenopathy.     Cervical:     Right cervical: No superficial cervical adenopathy.    Left cervical: No superficial cervical adenopathy.     Upper Body:     Right upper body: No supraclavicular adenopathy.     Left upper body: No supraclavicular adenopathy.  Neurological:     Mental Status: She is alert.  Psychiatric:        Behavior: Behavior is cooperative.      UC Treatments / Results  Labs (all labs ordered are listed, but only abnormal results are displayed) Labs Reviewed - No data to display  EKG   Radiology No results found.  Procedures Procedures (including critical care time)  Medications Ordered in UC Medications  ipratropium-albuterol  (DUONEB) 0.5-2.5 (3) MG/3ML nebulizer solution 3 mL (3 mLs Nebulization Given 09/19/24 2032)    Initial Impression / Assessment and Plan / UC Course  I  have reviewed the triage vital signs and the nursing notes.  Pertinent labs & imaging results that were available during my care of the patient were reviewed by me and considered in my medical decision making (see chart for details).      Final Clinical Impressions(s) / UC Diagnoses   Final diagnoses:  Acute bronchitis, unspecified organism   Patient is here today with concerns for body aches, coughing, chills and nasal congestion.  She states that she believes she had the flu and symptoms have been ongoing for about 10 days despite OTC medications she reports some relief with OTC medications but is still having persistent coughing.  Physical exam is notable for decreased air movement but vitals are largely within normal limits.  Patient reports some improvement in her breathing following a DuoNeb breathing treatment here in clinic.  At this time I am suspicious for potential bronchitis so we will start patient on a Z-Pak as well as prednisone  to assist with management.  Reviewed that she can continue OTC medications for further symptomatic relief.  ED and return precautions reviewed and provided in AVS.  Follow-up as needed.    Discharge Instructions      You were seen today for concerns of a persistent cough as well as nasal congestion and fatigue.  Based on your symptoms as well as the chronicity I suspect you  may have an acute case of bronchitis.  This basically means that the airways in your lungs are inflamed which can cause coughing and shortness of breath.  To assist with your symptoms I am sending you home with a medication called azithromycin , also known as a Z-Pak as well as a steroid burst of prednisone .  These medications should help reduce the inflammation in your airways and help you breathe a little bit easier and hopefully help reduce your coughing.  You can continue over-the-counter medications as needed for supportive care.  If you start to have more severe symptoms such as  difficulty breathing, fevers not responding to Tylenol  or ibuprofen , confusion or loss of consciousness please go to the emergency room.     ED Prescriptions     Medication Sig Dispense Auth. Provider   azithromycin  (ZITHROMAX ) 250 MG tablet Take 500mg  PO daily x1d and then 250mg  daily x4 days 6 each Jeremiyah Cullens E, PA-C   predniSONE  (DELTASONE ) 20 MG tablet Take 2 tablets (40 mg total) by mouth daily for 5 days. 10 tablet Juaquina Machnik E, PA-C      PDMP not reviewed this encounter.     [1]  Social History Tobacco Use   Smoking status: Former    Current packs/day: 0.00    Average packs/day: 0.5 packs/day for 15.0 years (7.5 ttl pk-yrs)    Types: Cigarettes    Start date: 09/29/1997    Quit date: 09/29/2012    Years since quitting: 11.9   Smokeless tobacco: Never   Tobacco comments:    quit in august of 2013  Vaping Use   Vaping status: Never Used  Substance Use Topics   Alcohol use: No    Alcohol/week: 0.0 standard drinks of alcohol   Drug use: No     Rhone Ozaki, Rocky BRAVO, PA-C 09/20/24 1411  "

## 2024-09-26 ENCOUNTER — Other Ambulatory Visit: Payer: Self-pay | Admitting: Obstetrics and Gynecology

## 2024-09-26 ENCOUNTER — Telehealth: Payer: Self-pay

## 2024-09-26 DIAGNOSIS — Z1231 Encounter for screening mammogram for malignant neoplasm of breast: Secondary | ICD-10-CM

## 2024-09-26 NOTE — Telephone Encounter (Signed)
 Called Patient to schedule appt/ LVM  Copied from CRM #[id number]. Topic: Clinical - Order For Equipment >> Sep 26, 2024  8:38 AM Mia Parker wrote: Reason for CRM: Patient needs a new script written for a C-PAP machine. Current device no longer working

## 2024-09-27 ENCOUNTER — Encounter: Payer: Self-pay | Admitting: Obstetrics and Gynecology

## 2024-09-27 ENCOUNTER — Ambulatory Visit: Admitting: Obstetrics and Gynecology

## 2024-09-27 VITALS — BP 118/62 | HR 97 | Temp 98.3°F | Ht 68.0 in | Wt 291.0 lb

## 2024-09-27 DIAGNOSIS — Z01419 Encounter for gynecological examination (general) (routine) without abnormal findings: Secondary | ICD-10-CM | POA: Diagnosis not present

## 2024-09-27 DIAGNOSIS — N393 Stress incontinence (female) (male): Secondary | ICD-10-CM | POA: Diagnosis not present

## 2024-09-27 DIAGNOSIS — Z1331 Encounter for screening for depression: Secondary | ICD-10-CM | POA: Diagnosis not present

## 2024-09-27 NOTE — Telephone Encounter (Signed)
 Scheduled appt for her to come in 09/29/24

## 2024-09-27 NOTE — Patient Instructions (Signed)

## 2024-09-27 NOTE — Assessment & Plan Note (Signed)
 Reviewed treatment options including weight loss, Kegels and pelvic floor therapy, pessaries, and surgical management.

## 2024-09-27 NOTE — Assessment & Plan Note (Addendum)
 Cervical cancer screening performed according to ASCCP guidelines. Encouraged annual mammogram screening Cologuard UTD Labs and immunizations with her primary Encouraged safe sexual practices as indicated Encouraged healthy lifestyle practices with diet and exercise

## 2024-09-27 NOTE — Progress Notes (Signed)
 "  54 y.o. [id number] female s/p BTL here for annual exam. Relationship x15 years. 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 PCP: Joyce Norleen BROCKS, MD   Patient's last menstrual period was 04/04/2024 (approximate).   She reports urinary leakage with cough, recent dx of bronchitis.  Sometimes leaks large amounts. Had back surgery a few years ago, sx 100% resolved nbut it has taken her awhile to get back into regular exercise.  Abnormal bleeding: Become more irregular over past 1-2 years Pelvic discharge or pain: none Breast mass, nipple discharge or skin changes : none  Birth control: BTL Last PAP:     Component Value Date/Time   DIAGPAP  07/01/2023 1426    - Negative for intraepithelial lesion or malignancy (NILM)   HPVHIGH Negative 07/01/2023 1426   ADEQPAP  07/01/2023 1426    Satisfactory for evaluation; transformation zone component ABSENT.   Last mammogram: 08/31/23 BIRADS 2, density B; scheduled 10/04/2024  Last colonoscopy: 2024 cologuard negative  Sexually active: yes  Exercising: no Former smoker, quit 2014  Flowsheet Row Office Visit from 09/27/2024 in [facility]  PHQ-2 Total Score 0   Flowsheet Row Office Visit from 07/02/2022 in [facility]  PHQ-9 Total Score 18   GYN HISTORY: No significant history  OB History  Gravida Para Term Preterm AB Living  3 2 2  0 1 2  SAB IAB Ectopic Multiple Live Births  0 1 0 0 2    # Outcome Date GA Lbr Len/2nd Weight Sex Type Anes PTL Lv  3 Term 05/23/16 [redacted]w[redacted]d  7 lb 1.6 oz (3.22 kg) F CS-LTranv Spinal  LIV  2 Term 06/07/13 108w3d   M CS-LTranv EPI  LIV  1 IAB 1995            Past Medical History:  Diagnosis Date   ADD (attention deficit disorder)    AMA (advanced maternal age) multigravida 35+    Anxiety    Back pain    Depression    Diabetes mellitus without complication (HCC)    GDM with 2017 pregnancy   Hypertension    with 2017 pregnancy, no meds    Infertility, female    Joint pain    Obesity    Osteoporosis    Postpartum edema 06/10/2013   Sleep apnea    use CPAP nightly    Past Surgical History:  Procedure Laterality Date   CESAREAN SECTION N/A 06/07/2013   Procedure: Primary Cesarean Section Delivery Baby  Boy @ 2317, Apgars   ;  Surgeon: Charlie JINNY Flowers, MD;  Location: [facility];  Service: Obstetrics;  Laterality: N/A;   CESAREAN SECTION N/A 05/23/2016   Procedure: CESAREAN SECTION;  Surgeon: Charlie Flowers, MD;  Location: [facility]ING SUIT[facility];  Service: Obstetrics;  Laterality: N/A;   KNEE CARTILAGE SURGERY Right    LEEP     TUBAL LIGATION Bilateral 05/23/2016   Procedure: BILATERAL TUBAL LIGATION;  Surgeon: Charlie Flowers, MD;  Location: [facility]THING SUIT[facility];  Service: Obstetrics;  Laterality: Bilateral;   WISDOM TOOTH EXTRACTION      Current Outpatient Medications on File Prior to Visit  Medication Sig Dispense Refill   celecoxib  (CELEBREX ) 200 MG capsule Take 1 capsule (200 mg total) by mouth 2 (two) times daily. 60 capsule 0   Cyanocobalamin  (VITAMIN B 12) 500 MCG TABS Take 500 mcg by mouth daily at 6 (six) AM. 30 tablet 2   UNABLE TO FIND Take 1  capsule by mouth every 7 (seven) days.     Vitamin D , Ergocalciferol , (DRISDOL ) 1.25 MG (50000 UNIT) CAPS capsule Take 1 capsule (50,000 Units total) by mouth every 7 (seven) days. 5 capsule 0   topiramate  (TOPAMAX ) 25 MG tablet Take 2 tablets (50 mg total) by mouth daily. (Patient not taking: Reported on 09/27/2024) 60 tablet 0   No current facility-administered medications on file prior to visit.    Social History   Socioeconomic History   Marital status: Single    Spouse name: Gordy   Number of children: 2   Years of education: Not on file   Highest education level: Not on file  Occupational History   Not on file  Tobacco Use   Smoking status: Former    Current packs/day: 0.00    Average packs/day: 0.5 packs/day for 15.0 years (7.5 ttl pk-yrs)    Types: Cigarettes    Start  date: 09/29/1997    Quit date: 09/29/2012    Years since quitting: 12.0   Smokeless tobacco: Never   Tobacco comments:    quit in august of 2013  Vaping Use   Vaping status: Never Used  Substance and Sexual Activity   Alcohol use: No    Alcohol/week: 0.0 standard drinks of alcohol   Drug use: No   Sexual activity: Yes    Birth control/protection: Surgical    Comment: BTL  Other Topics Concern   Not on file  Social History Narrative   Not on file   Social Drivers of Health   Tobacco Use: Medium Risk (09/27/2024)   Patient History    Smoking Tobacco Use: Former    Smokeless Tobacco Use: Never    Passive Exposure: Not on Actuary Strain: Not on file  Food Insecurity: Not on file  Transportation Needs: Not on file  Physical Activity: Not on file  Stress: Not on file  Social Connections: Unknown (01/28/2022)   [facility]   Social Network    Social Network: Not on file  Intimate Partner Violence: Unknown (12/20/2021)   Received from [facility]   HITS    Physically Hurt: Not on file    Insult or Talk Down To: Not on file    Threaten Physical Harm: Not on file    Scream or Curse: Not on file  Depression (PHQ2-9): Low Risk (09/27/2024)   Depression (PHQ2-9)    PHQ-2 Score: 0  Alcohol Screen: Not on file  Housing: Not on file  Utilities: Not on file  Health Literacy: Not on file    Family History  Problem Relation Age of Onset   Hyperlipidemia Mother    Hypertension Mother    Hyperlipidemia Father    Diabetes Father    Hypertension Father    Heart attack Father    Heart disease Father    Breast cancer Neg Hx    Ovarian cancer Neg Hx     Allergies  Allergen Reactions   Hydrocodone -Acetaminophen  Other (See Comments)    feels like a panic attack    Hydrocodone  Anxiety and Palpitations      PE Today's Vitals   09/27/24 1129  BP: 118/62  Pulse: 97  Temp: 98.3 F (36.8 C)  TempSrc: Oral  SpO2: 97%  Weight: 291 lb (132  kg)  Height: 5' 8 (1.727 m)   Body mass index is 44.25 kg/m.  Physical Exam Vitals reviewed. Exam conducted with a chaperone present.  Constitutional:      General:  She is not in acute distress.    Appearance: Normal appearance.  HENT:     Head: Normocephalic and atraumatic.     Nose: Nose normal.  Eyes:     Extraocular Movements: Extraocular movements intact.     Conjunctiva/sclera: Conjunctivae normal.  Pulmonary:     Effort: Pulmonary effort is normal.  Chest:     Chest wall: No mass or tenderness.  Breasts:    Right: Normal. No swelling, mass, nipple discharge, skin change or tenderness.     Left: Normal. No swelling, mass, nipple discharge, skin change or tenderness.  Abdominal:     General: There is no distension.     Palpations: Abdomen is soft.     Tenderness: There is no abdominal tenderness.  Genitourinary:    General: Normal vulva.     Exam position: Lithotomy position.     Urethra: No prolapse.     Vagina: Normal. No vaginal discharge or bleeding.     Cervix: Normal. No lesion.     Uterus: Normal. Not enlarged and not tender.      Adnexa: Right adnexa normal and left adnexa normal.     Comments: Kegel 3/5 Musculoskeletal:        General: Normal range of motion.  Lymphadenopathy:     Upper Body:     Right upper body: No axillary adenopathy.     Left upper body: No axillary adenopathy.     Lower Body: No right inguinal adenopathy. No left inguinal adenopathy.  Skin:    General: Skin is warm and dry.  Neurological:     General: No focal deficit present.     Mental Status: She is alert.  Psychiatric:        Mood and Affect: Mood normal.        Behavior: Behavior normal.      Assessment and Plan:        Well woman exam with routine gynecological exam Assessment & Plan: Cervical cancer screening performed according to ASCCP guidelines. Encouraged annual mammogram screening Cologuard UTD Labs and immunizations with her primary Encouraged safe sexual  practices as indicated Encouraged healthy lifestyle practices with diet and exercise    SUI (stress urinary incontinence, female) Assessment & Plan: Reviewed treatment options including weight loss, Kegels and pelvic floor therapy, pessaries, and surgical management.    Orders: -     Ambulatory referral to Physical Therapy  Negative depression screening   Vera LULLA Pa, MD  "

## 2024-09-29 ENCOUNTER — Encounter: Payer: Self-pay | Admitting: Family Medicine

## 2024-09-29 ENCOUNTER — Other Ambulatory Visit: Payer: Self-pay

## 2024-09-29 ENCOUNTER — Ambulatory Visit: Admitting: Family Medicine

## 2024-09-29 VITALS — BP 122/78 | HR 80 | Ht 68.0 in | Wt 296.4 lb

## 2024-09-29 DIAGNOSIS — Z23 Encounter for immunization: Secondary | ICD-10-CM

## 2024-09-29 DIAGNOSIS — G4733 Obstructive sleep apnea (adult) (pediatric): Secondary | ICD-10-CM | POA: Diagnosis not present

## 2024-09-29 DIAGNOSIS — F900 Attention-deficit hyperactivity disorder, predominantly inattentive type: Secondary | ICD-10-CM

## 2024-09-29 DIAGNOSIS — R5383 Other fatigue: Secondary | ICD-10-CM

## 2024-09-29 MED ORDER — ZEPBOUND 2.5 MG/0.5ML ~~LOC~~ SOAJ: 2.5000 mg | SUBCUTANEOUS | 1 refills | Status: AC

## 2024-09-29 NOTE — Progress Notes (Signed)
" ° °  Subjective:    Patient ID: Mia Parker, female    DOB: 12/09/1970, 54 y.o.   MRN: [id number]  Discussed the use of AI scribe software for clinical note transcription with the patient, who gave verbal consent to proceed.  History of Present Illness   Mia Parker is a 54 year old female who presents for evaluation of her CPAP machine and weight management.  She has a history of obstructive sleep apnea for nine years and has been using the same CPAP machine since her diagnosis 9 years ago. the machine was obtained from [employer], which is no longer in business.  She has a history of ADHD, for which she previously took medication. However, she stopped the medication following back surgery and has been managing her symptoms with coping mechanisms since returning to work in July. She describes herself as 'inattentive.' She reports coping well without medication.  Regarding her weight management, she was previously on Wegovy , a GLP-1 receptor agonist, but her insurance stopped covering it. She has an appointment with [facility] in February. She reports that she was losing weight on Wegovy  and has been following a diet and exercise regimen.  She recently had the flu followed by bronchitis, which she describes as a severe illness, stating 'I have never been sick like that.' She has had COVID-19 five times but reports that the flu was worse. She has not received the flu or COVID-19 vaccines recently.  Her immunization history includes not having received a recent tetanus booster, shingles, or pneumonia vaccines.  She works night shifts, starting at 6 PM and ending at 3:30 AM. She has two children, the youngest being 26 years old. She manages screen time for her children, allowing them one hour per day.           Review of Systems     Objective:    Physical Exam Alert and in no distress otherwise not examined            Assessment & Plan:  Assessment and Plan    Chronic obstructive sleep apnea managed with CPAP for nine years. Current CPAP machine outdated, replacement needed. Insurance may require new sleep study for replacement. - Ordered home sleep study. - Coordinated with [facility] for CPAP replacement.  Morbid obesity due to excess calories Morbid obesity previously managed with GLP-1 agonist, discontinued due to insurance issues. New insurance expected in January. Zepbound  indicated for weight loss and may aid in managing sleep apnea. Considered more effective than Wegovy  and covered for sleep apnea. - Prescribed Zepbound  with a month's supply and a refill. - Continue follow-up with [facility] for diet, exercise, and psychological support.  Attention-deficit hyperactivity disorder, predominantly inattentive type Previously managed with medication, discontinued after back surgery. Currently managing with coping mechanisms. Medication can be resumed if needed. - Advised to call if ADHD medication is needed.  General health maintenance Routine health maintenance discussed. Immunizations and screenings up to date except for tetanus, shingles, and pneumonia vaccines. - Administered TDAP vaccine. - Plan for shingles vaccine at next visit. - Continue routine health screenings and immunizations.        "

## 2024-10-04 ENCOUNTER — Ambulatory Visit
Admission: RE | Admit: 2024-10-04 | Discharge: 2024-10-04 | Disposition: A | Source: Ambulatory Visit | Attending: Obstetrics and Gynecology

## 2024-10-04 DIAGNOSIS — Z1231 Encounter for screening mammogram for malignant neoplasm of breast: Secondary | ICD-10-CM

## 2024-10-05 ENCOUNTER — Other Ambulatory Visit (HOSPITAL_COMMUNITY): Payer: Self-pay

## 2024-10-06 ENCOUNTER — Other Ambulatory Visit (HOSPITAL_COMMUNITY): Payer: Self-pay

## 2024-10-10 ENCOUNTER — Other Ambulatory Visit (HOSPITAL_COMMUNITY): Payer: Self-pay

## 2024-10-11 ENCOUNTER — Telehealth: Payer: Self-pay | Admitting: Pharmacy Technician

## 2024-10-11 ENCOUNTER — Other Ambulatory Visit (HOSPITAL_COMMUNITY): Payer: Self-pay

## 2024-10-11 NOTE — Telephone Encounter (Signed)
 Pharmacy Patient Advocate Encounter   Received notification from [employer] that prior authorization for Zepbound  2.5MG /0.5ML pen-injectors  is required/requested.   Insurance verification completed.   The patient is insured through [employer].   Per test claim: PA required; PA submitted to above mentioned insurance via Latent Key/confirmation #/EOC [id number] Status is pending

## 2024-10-12 ENCOUNTER — Other Ambulatory Visit (HOSPITAL_COMMUNITY): Payer: Self-pay

## 2024-10-12 ENCOUNTER — Ambulatory Visit: Attending: Obstetrics and Gynecology | Admitting: Physical Therapy

## 2024-10-12 ENCOUNTER — Other Ambulatory Visit: Payer: Self-pay

## 2024-10-12 DIAGNOSIS — M6281 Muscle weakness (generalized): Secondary | ICD-10-CM | POA: Insufficient documentation

## 2024-10-12 DIAGNOSIS — N393 Stress incontinence (female) (male): Secondary | ICD-10-CM | POA: Diagnosis present

## 2024-10-12 DIAGNOSIS — R293 Abnormal posture: Secondary | ICD-10-CM | POA: Insufficient documentation

## 2024-10-12 DIAGNOSIS — R279 Unspecified lack of coordination: Secondary | ICD-10-CM | POA: Diagnosis present

## 2024-10-12 NOTE — Therapy (Unsigned)
 " OUTPATIENT PHYSICAL THERAPY FEMALE PELVIC EVALUATION   Patient Name: Mia Parker MRN: [id number] DOB:[birth year], 54 y.o., female Today's Date: 10/12/2024  END OF SESSION:  PT End of Session - 10/12/24 1505     Visit Number 1    Date for Recertification  04/11/25    Authorization Type [employer]    PT Start Time 1445    PT Stop Time 1526    PT Time Calculation (min) 41 min    Activity Tolerance Patient tolerated treatment well    Behavior During Therapy WFL for tasks assessed/performed          Past Medical History:  Diagnosis Date   ADD (attention deficit disorder)    AMA (advanced maternal age) multigravida 35+    Anxiety    Back pain    Depression    Diabetes mellitus without complication (HCC)    GDM with 2017 pregnancy   Hypertension    with 2017 pregnancy, no meds   Infertility, female    Joint pain    Obesity    Osteoporosis    Postpartum edema 06/10/2013   Sleep apnea    use CPAP nightly   Past Surgical History:  Procedure Laterality Date   CESAREAN SECTION N/A 06/07/2013   Procedure: Primary Cesarean Section Delivery Baby  Boy @ 2317, Apgars   ;  Surgeon: Charlie JINNY Flowers, MD;  Location: [facility];  Service: Obstetrics;  Laterality: N/A;   CESAREAN SECTION N/A 05/23/2016   Procedure: CESAREAN SECTION;  Surgeon: Charlie Flowers, MD;  Location: [facility];  Service: Obstetrics;  Laterality: N/A;   KNEE CARTILAGE SURGERY Right    LEEP     TUBAL LIGATION Bilateral 05/23/2016   Procedure: BILATERAL TUBAL LIGATION;  Surgeon: Charlie Flowers, MD;  Location: [facility];  Service: Obstetrics;  Laterality: Bilateral;   WISDOM TOOTH EXTRACTION     Patient Active Problem List   Diagnosis Date Noted   SUI (stress urinary incontinence, female) 09/27/2024   Well woman exam with routine gynecological exam 07/01/2023   Vasomotor symptoms due to menopause 07/01/2023   Hypercholesterolemia 06/30/2023   BMI 40.0-44.9, adult (HCC) Current BMI 42.8 01/27/2023    Obesity, Beginning BMI 43.79 10/27/2022   Insulin  resistance 09/24/2022   Polyphagia 08/18/2022   Spinal stenosis of lumbar region 07/15/2022   Vitamin D  deficiency 07/15/2022   Pre-diabetes 07/15/2022   Other fatigue 07/02/2022   SOBOE (shortness of breath on exertion) 07/02/2022   Attention deficit disorder 07/02/2022   OSA on CPAP 07/02/2022   Lumbar spondylosis 07/02/2022   Depression 07/02/2022   History of postpartum depression 02/22/2020   History of gestational diabetes 02/22/2020   Glucose intolerance 02/22/2020   Allergic eosinophilia 10/11/2015   Morbid obesity due to excess calories (HCC) 09/14/2015   ADHD (attention deficit hyperactivity disorder) 07/08/2011    PCP: ***  REFERRING PROVIDER: ***  REFERRING DIAG: ***  THERAPY DIAG:  Stress incontinence  Muscle weakness (generalized)  Unspecified lack of coordination  Abnormal posture  Rationale for Evaluation and Treatment: Rehabilitation  ONSET DATE: worsening over the past 2 years  SUBJECTIVE:  SUBJECTIVE STATEMENT: Stress urinary incontinence with sneezing and coughing - made much worse with flu than bronchitis. Was needing to use pads when sick.  Will have strong urge and then no have to go when in bathroom, but less like she could leak.   Fluid intake: 60oz; diet coke daily  FUNCTIONAL LIMITATIONS: lifting, sledding but these are more due to back   PERTINENT HISTORY:  Medications for current condition: no  Surgeries: L4-L5 fusion 2023 Other:  Sexual abuse: No  PAIN:  Are you having pain? No   PRECAUTIONS: Other: had back surgery precautions no lifting more 20#  RED FLAGS: None   WEIGHT BEARING RESTRICTIONS: No  FALLS:  Has patient fallen in last 6 months? No  OCCUPATION: nights - billing   ACTIVITY  LEVEL : low  PLOF: Independent  PATIENT GOALS: to have no urinary incontinence   BOWEL MOVEMENT: Pain with bowel movement: No   URINATION: Pain with urination: No Fully empty bladder: Yes:                                           Post-void dribble: No Stream: Strong usually but does have false urges which then has dribble  Urgency: Yes  Frequency:during the day not quicker than every 2 hours, sometimes 45 mins but not always                                                        Nocturia: No   Leakage: Coughing and Sneezing Pads/briefs: Yes: not usually just with sickness  INTERCOURSE:  Ability to have vaginal penetration Yes but low desire    PREGNANCY: Vaginal deliveries 0 C-section deliveries 2 Currently pregnant No  PROLAPSE: None   OBJECTIVE:  Note: Objective measures were completed at Evaluation unless otherwise noted.  DIAGNOSTIC FINDINGS:  Post-void residual: Voiding Cystourethrogram (VCUG):  Ultrasound: ***  PATIENT SURVEYS:  {rehab surveys:24030}  PFIQ-7: *** UIQ-7 *** CRAIG -7 *** POPIQ-7 *** Female Sexual Function Index (FSFI) Questionnaire ***  COGNITION: Overall cognitive status: {cognition:24006}     SENSATION: Light touch: {intact/deficits:24005}  LUMBAR SPECIAL TESTS:  {lumbar special test:25242}  FUNCTIONAL TESTS:  {Functional tests:24029} Single leg stance:  Rt:  Lt: Sit-up test: Squat: Bed mobility:  GAIT: Assistive device utilized: {Assistive devices:23999} Comments: ***  POSTURE: {posture:25561}   LUMBARAROM/PROM:  A/PROM A/PROM  Eval (% available)  Flexion   Extension   Right lateral flexion   Left lateral flexion   Right rotation   Left rotation    (Blank rows = not tested)  LOWER EXTREMITY ROM:  {AROM/PROM:27142} ROM Right eval Left eval  Hip flexion    Hip extension    Hip abduction    Hip adduction    Hip internal rotation    Hip external rotation    Knee flexion    Knee extension     Ankle dorsiflexion    Ankle plantarflexion    Ankle inversion    Ankle eversion     (Blank rows = not tested)  LOWER EXTREMITY MMT:  MMT Right eval Left eval  Hip flexion    Hip extension    Hip abduction    Hip adduction    Hip internal rotation  Hip external rotation    Knee flexion    Knee extension    Ankle dorsiflexion    Ankle plantarflexion    Ankle inversion    Ankle eversion     (Blank rows = not tested) PALPATION:  General: ***  Pelvic Alignment: ***  Abdominal: ***  Diastasis: {Yes/No:304960894}*** Distortion: {YES/NO AS:20300}  Breathing: *** Scar tissue: {Yes/No:304960894}*** Active Straight Leg Raise: ***                External Perineal Exam: ***                             Internal Pelvic Floor: ***  Patient confirms identification and approves PT to assess internal pelvic floor and treatment {yes/no:20286} All internal or external pelvic floor assessments and/or treatments are completed with proper hand hygiene and gloves hands. If needed gloves are changed with hand hygiene during patient care time.  PELVIC MMT:   MMT eval  Vaginal   Internal Anal Sphincter   External Anal Sphincter   Puborectalis   (Blank rows = not tested)        TONE: ***  PROLAPSE: ***  TODAY'S TREATMENT:                                                                                                                              DATE: ***  EVAL ***   PATIENT EDUCATION:  Education details: *** Person educated: {Person educated:25204} Education method: {Education Method:25205} Education comprehension: {Education Comprehension:25206}  HOME EXERCISE PROGRAM: ***  ASSESSMENT:  CLINICAL IMPRESSION: Patient is a *** y.o. *** who was seen today for physical therapy evaluation and treatment for ***.   OBJECTIVE IMPAIRMENTS: {opptimpairments:25111}.   ACTIVITY LIMITATIONS: {activitylimitations:27494}  PARTICIPATION LIMITATIONS:  {participationrestrictions:25113}  PERSONAL FACTORS: {Personal factors:25162} are also affecting patient's functional outcome.   REHAB POTENTIAL: {rehabpotential:25112}  CLINICAL DECISION MAKING: {clinical decision making:25114}  EVALUATION COMPLEXITY: {Evaluation complexity:25115}   GOALS: Goals reviewed with patient? {yes/no:20286}  SHORT TERM GOALS: Target date: ***  *** Baseline: Goal status: INITIAL  2.  *** Baseline:  Goal status: INITIAL  3.  *** Baseline:  Goal status: INITIAL  4.  *** Baseline:  Goal status: INITIAL  5.  *** Baseline:  Goal status: INITIAL  6.  *** Baseline:  Goal status: INITIAL  LONG TERM GOALS: Target date: ***  *** Baseline:  Goal status: INITIAL  2.  *** Baseline:  Goal status: INITIAL  3.  *** Baseline:  Goal status: INITIAL  4.  *** Baseline:  Goal status: INITIAL  5.  *** Baseline:  Goal status: INITIAL  6.  *** Baseline:  Goal status: INITIAL  PLAN:  PT FREQUENCY: {rehab frequency:25116}  PT DURATION: {rehab duration:25117}  PLANNED INTERVENTIONS: {rehab planned interventions:25118::97110-Therapeutic exercises,97530- Therapeutic [phone]- Neuromuscular re-education,97535- Self Rjmz,02859- Manual therapy,Patient/Family education}  PLAN FOR NEXT SESSION: ***   Darryle Navy, PT, DPT 01/28/263:06 PM  [facility] [address] [place], [place] [address] Phone # [phone] Fax [phone]  "

## 2024-10-13 NOTE — Telephone Encounter (Signed)
 Pharmacy Patient Advocate Encounter  Received notification from [employer] that Prior Authorization for Zepbound  2.5MG /0.5ML pen-injectors  has been DENIED.  Full denial letter will be uploaded to the media tab. See denial reason below. and it is denied for the following reason: because we did not see what we need to approve the drug you asked for, (Zepbound ). We may be able to approve this drug when we see certain records (documentation of your baseline weight and body mass index [BMI] measured within the past 45 days of this prior authorization request; documentation that the diagnosis of sleep apnea was confirmed by sleep apnea testing). We based this decision on your health plans prior authorization clinical criteria named GLP1s (glucagon-like peptide 1s) for Weight Management.  PA #/Case ID/Reference #: [id number]

## 2024-10-18 ENCOUNTER — Ambulatory Visit: Payer: Self-pay | Admitting: Obstetrics and Gynecology

## 2024-10-19 ENCOUNTER — Other Ambulatory Visit (HOSPITAL_COMMUNITY): Payer: Self-pay

## 2024-10-26 ENCOUNTER — Ambulatory Visit: Admitting: Physical Therapy

## 2024-10-31 ENCOUNTER — Ambulatory Visit (INDEPENDENT_AMBULATORY_CARE_PROVIDER_SITE_OTHER): Admitting: Physician Assistant

## 2024-11-01 ENCOUNTER — Ambulatory Visit (HOSPITAL_BASED_OUTPATIENT_CLINIC_OR_DEPARTMENT_OTHER): Admitting: Pulmonary Disease

## 2024-11-08 ENCOUNTER — Ambulatory Visit: Admitting: Physical Therapy

## 2024-11-16 ENCOUNTER — Ambulatory Visit: Admitting: Physical Therapy

## 2024-11-24 ENCOUNTER — Ambulatory Visit: Admitting: Physical Therapy

## 2024-11-29 ENCOUNTER — Ambulatory Visit: Admitting: Physical Therapy

## 2024-12-06 ENCOUNTER — Ambulatory Visit: Admitting: Physical Therapy

## 2024-12-20 ENCOUNTER — Ambulatory Visit: Admitting: Physical Therapy

## 2024-12-28 ENCOUNTER — Ambulatory Visit: Admitting: Physical Therapy
